# Patient Record
Sex: Female | Born: 1960
Health system: Southern US, Community
[De-identification: ages and names within clinical notes are randomized; demographics above are authoritative.]

## PROBLEM LIST (undated history)

## (undated) DIAGNOSIS — F329 Major depressive disorder, single episode, unspecified: Secondary | ICD-10-CM

## (undated) DIAGNOSIS — K219 Gastro-esophageal reflux disease without esophagitis: Secondary | ICD-10-CM

## (undated) DIAGNOSIS — R12 Heartburn: Secondary | ICD-10-CM

## (undated) DIAGNOSIS — J45909 Unspecified asthma, uncomplicated: Secondary | ICD-10-CM

## (undated) DIAGNOSIS — N200 Calculus of kidney: Secondary | ICD-10-CM

## (undated) DIAGNOSIS — K859 Acute pancreatitis without necrosis or infection, unspecified: Secondary | ICD-10-CM

## (undated) DIAGNOSIS — M1990 Unspecified osteoarthritis, unspecified site: Secondary | ICD-10-CM

## (undated) DIAGNOSIS — M5030 Other cervical disc degeneration, unspecified cervical region: Secondary | ICD-10-CM

## (undated) DIAGNOSIS — I10 Essential (primary) hypertension: Secondary | ICD-10-CM

## (undated) DIAGNOSIS — M4802 Spinal stenosis, cervical region: Secondary | ICD-10-CM

## (undated) DIAGNOSIS — E785 Hyperlipidemia, unspecified: Secondary | ICD-10-CM

## (undated) DIAGNOSIS — G4730 Sleep apnea, unspecified: Secondary | ICD-10-CM

## (undated) DIAGNOSIS — M5412 Radiculopathy, cervical region: Secondary | ICD-10-CM

## (undated) DIAGNOSIS — F32A Depression, unspecified: Secondary | ICD-10-CM

## (undated) DIAGNOSIS — G43909 Migraine, unspecified, not intractable, without status migrainosus: Secondary | ICD-10-CM

## (undated) DIAGNOSIS — M48061 Spinal stenosis, lumbar region without neurogenic claudication: Secondary | ICD-10-CM

## (undated) DIAGNOSIS — E119 Type 2 diabetes mellitus without complications: Secondary | ICD-10-CM

## (undated) DIAGNOSIS — Z87442 Personal history of urinary calculi: Secondary | ICD-10-CM

## (undated) HISTORY — DX: Spinal stenosis, cervical region: M48.02

## (undated) HISTORY — DX: Calculus of kidney: N20.0

## (undated) HISTORY — DX: Spinal stenosis, lumbar region without neurogenic claudication: M48.061

## (undated) HISTORY — DX: Type 2 diabetes mellitus without complications: E11.9

## (undated) HISTORY — DX: Personal history of urinary calculi: Z87.442

## (undated) HISTORY — DX: Unspecified osteoarthritis, unspecified site: M19.90

## (undated) HISTORY — DX: Hyperlipidemia, unspecified: E78.5

## (undated) HISTORY — DX: Sleep apnea, unspecified: G47.30

## (undated) HISTORY — PX: TUBAL LIGATION: SHX77

## (undated) HISTORY — DX: Heartburn: R12

## (undated) HISTORY — DX: Other cervical disc degeneration, unspecified cervical region: M50.30

## (undated) HISTORY — PX: COLONOSCOPY: SHX174

## (undated) HISTORY — DX: Migraine, unspecified, not intractable, without status migrainosus: G43.909

## (undated) HISTORY — DX: Unspecified asthma, uncomplicated: J45.909

---

## 1984-08-14 DIAGNOSIS — O24419 Gestational diabetes mellitus in pregnancy, unspecified control: Secondary | ICD-10-CM

## 1994-05-21 HISTORY — PX: CARPAL TUNNEL RELEASE: SHX101

## 1999-01-16 ENCOUNTER — Other Ambulatory Visit: Admission: RE | Admit: 1999-01-16 | Discharge: 1999-01-16 | Payer: Self-pay | Admitting: Obstetrics and Gynecology

## 1999-07-19 ENCOUNTER — Other Ambulatory Visit: Admission: RE | Admit: 1999-07-19 | Discharge: 1999-07-19 | Payer: Self-pay | Admitting: *Deleted

## 2001-04-15 ENCOUNTER — Other Ambulatory Visit: Admission: RE | Admit: 2001-04-15 | Discharge: 2001-04-15 | Payer: Self-pay | Admitting: Obstetrics and Gynecology

## 2002-10-13 ENCOUNTER — Other Ambulatory Visit: Admission: RE | Admit: 2002-10-13 | Discharge: 2002-10-13 | Payer: Self-pay | Admitting: Obstetrics and Gynecology

## 2004-03-14 ENCOUNTER — Ambulatory Visit: Payer: Self-pay | Admitting: Obstetrics and Gynecology

## 2004-03-30 ENCOUNTER — Ambulatory Visit: Payer: Self-pay | Admitting: Obstetrics and Gynecology

## 2005-06-19 ENCOUNTER — Ambulatory Visit: Payer: Self-pay | Admitting: Obstetrics and Gynecology

## 2006-12-20 ENCOUNTER — Ambulatory Visit: Payer: Self-pay

## 2007-02-19 ENCOUNTER — Ambulatory Visit: Payer: Self-pay | Admitting: Internal Medicine

## 2007-03-26 ENCOUNTER — Ambulatory Visit: Payer: Self-pay | Admitting: Internal Medicine

## 2007-04-14 ENCOUNTER — Ambulatory Visit: Payer: Self-pay | Admitting: Gastroenterology

## 2007-05-22 HISTORY — PX: CERVICAL FUSION: SHX112

## 2007-07-17 ENCOUNTER — Ambulatory Visit: Payer: Self-pay | Admitting: Emergency Medicine

## 2007-08-19 ENCOUNTER — Ambulatory Visit: Payer: Self-pay | Admitting: General Practice

## 2007-09-09 ENCOUNTER — Inpatient Hospital Stay (HOSPITAL_COMMUNITY): Admission: RE | Admit: 2007-09-09 | Discharge: 2007-09-11 | Payer: Self-pay | Admitting: Neurosurgery

## 2007-10-22 ENCOUNTER — Encounter: Payer: Self-pay | Admitting: Neurosurgery

## 2007-11-19 ENCOUNTER — Encounter: Payer: Self-pay | Admitting: Neurosurgery

## 2007-12-20 ENCOUNTER — Encounter: Payer: Self-pay | Admitting: Neurosurgery

## 2008-03-04 ENCOUNTER — Ambulatory Visit: Payer: Self-pay

## 2008-06-04 ENCOUNTER — Emergency Department: Payer: Self-pay | Admitting: Emergency Medicine

## 2008-07-14 ENCOUNTER — Encounter: Payer: Self-pay | Admitting: General Practice

## 2008-07-19 ENCOUNTER — Encounter: Payer: Self-pay | Admitting: General Practice

## 2008-08-19 ENCOUNTER — Encounter: Payer: Self-pay | Admitting: General Practice

## 2008-09-18 ENCOUNTER — Encounter: Payer: Self-pay | Admitting: General Practice

## 2009-01-01 ENCOUNTER — Emergency Department: Payer: Self-pay | Admitting: Internal Medicine

## 2009-01-05 ENCOUNTER — Emergency Department: Payer: Self-pay | Admitting: Unknown Physician Specialty

## 2009-01-27 ENCOUNTER — Ambulatory Visit: Payer: Self-pay | Admitting: Surgery

## 2009-01-28 ENCOUNTER — Ambulatory Visit: Payer: Self-pay | Admitting: Surgery

## 2009-03-08 ENCOUNTER — Ambulatory Visit: Payer: Self-pay

## 2009-04-04 ENCOUNTER — Inpatient Hospital Stay: Payer: Self-pay | Admitting: Internal Medicine

## 2010-02-10 ENCOUNTER — Ambulatory Visit: Payer: Self-pay | Admitting: Internal Medicine

## 2010-03-30 ENCOUNTER — Ambulatory Visit: Payer: Self-pay | Admitting: Unknown Physician Specialty

## 2010-09-01 ENCOUNTER — Ambulatory Visit: Payer: Self-pay | Admitting: Internal Medicine

## 2010-10-03 NOTE — Op Note (Signed)
[name], [name]              ACCOUNT NO.:  [id number]   MEDICAL RECORD NO.:  [id number]          PATIENT TYPE:  INP   LOCATION:  2899                         FACILITY:  [facility]   PHYSICIAN:  Hilda Lias, M.D.   DATE OF BIRTH:  1960-12-31   DATE OF PROCEDURE:  09/09/2007  DATE OF DISCHARGE:                               OPERATIVE REPORT   PREOPERATIVE DIAGNOSIS:  Cervical myelopathy with cervical stenosis C4-  C5, C5-C6, and C6-C7.   POSTOPERATIVE DIAGNOSIS:  Cervical myelopathy with cervical stenosis C4-  C5, C5-C6, and C6-C7.   PROCEDURES:  Anterior C4-C5, C5-C6, and C6-C7 diskectomy, decompression  of the spinal cord, foraminotomy, interbody fusion with auto and  allograft plate, microscope.   SURGEON:  Hilda Lias, MD   ASSISTANT:  Dr. Lovell Sheehan.   CLINICAL HISTORY:  The patient was seen in my office because of his neck  pain with the weakness.  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  X-ray  shows severe stenosis from C4 down to C6-C7.  So, it was advised, the  risks was explained in the history and physical.   DESCRIPTION OF PROCEDURE:  The patient was taken to the operating room  and after intubation, the neck was cleaned with DuraPrep.  We attempted  to do a transverse incision but she had almost no neck.  We had to put  some pillow underneath the shoulder and also the neck just to bring  in  some space.  We had to pull the shoulder down with tape.  Then a  longitudinal incision was made through the skin and subcutaneous tissue,  platysma down to cervical spine.  It took [place] at least 5 x-rays before we  were able to see the tip of the x-ray, and the needle at the level of  C3-C4 and C4-C5.  __________  we opened the anterior ligament of C4-C5,  C5-C6, and C6-C7.  We brought the microscope into the area because we  knew that C6-C7 was the worst, we opened the anterior ligament using the  __________  as well as the 1 and 2 millimeter Kerrison punch.  We were  able to do a total diskectomy, there was a large calcified disk, which  was in the midline going to the left side after we removed the posterior  ligament, decompression of the spinal cord was achieved.  The same  finding was also a little bit less pronounced but nevertheless quite  significant at the level of C5-C6 and also at the level of C4-C5.  At  the end we had plenty of roof at the space at the level C4-C5, C5-C6,  and C6-C7.  Three allograft of 7 mm, lordotic, with autograft the needle  were inserted followed by a plate using 8 screws, lateral cervical spine  showed that there was a good position of the bone graft and the plate at  the level of C4-C5, we were unable to see below.  From there on to be  accomplished the hemostasis, we will have drain the  area and the wound  was closed with Vicryl and Steri-Strips.           ______________________________  Hilda Lias, M.D.     EB/MEDQ  D:  09/09/2007  T:  09/10/2007  Job:  [id number]

## 2011-02-05 ENCOUNTER — Ambulatory Visit: Payer: Self-pay | Admitting: Internal Medicine

## 2011-02-13 LAB — CBC
HCT: 38.6
MCHC: 34.1
MCV: 92.3
RBC: 4.18
WBC: 8.1

## 2011-02-13 LAB — BASIC METABOLIC PANEL
BUN: 10
CO2: 27
Chloride: 102
GFR calc Af Amer: >60
Potassium: 4.7

## 2011-03-07 ENCOUNTER — Ambulatory Visit: Payer: Self-pay | Admitting: Internal Medicine

## 2011-03-22 ENCOUNTER — Ambulatory Visit: Payer: Self-pay | Admitting: Internal Medicine

## 2011-04-21 ENCOUNTER — Ambulatory Visit: Payer: Self-pay | Admitting: Internal Medicine

## 2011-05-24 ENCOUNTER — Ambulatory Visit: Payer: Self-pay | Admitting: Gastroenterology

## 2011-06-09 ENCOUNTER — Emergency Department: Payer: Self-pay | Admitting: Emergency Medicine

## 2011-06-19 ENCOUNTER — Ambulatory Visit: Payer: Self-pay | Admitting: Internal Medicine

## 2011-12-03 ENCOUNTER — Emergency Department: Payer: Self-pay | Admitting: Emergency Medicine

## 2012-03-21 ENCOUNTER — Ambulatory Visit: Payer: Self-pay

## 2012-04-08 ENCOUNTER — Other Ambulatory Visit: Payer: Self-pay | Admitting: Neurosurgery

## 2012-04-08 DIAGNOSIS — M5410 Radiculopathy, site unspecified: Secondary | ICD-10-CM

## 2012-04-08 DIAGNOSIS — M549 Dorsalgia, unspecified: Secondary | ICD-10-CM

## 2012-04-08 DIAGNOSIS — M542 Cervicalgia: Secondary | ICD-10-CM

## 2012-04-24 ENCOUNTER — Other Ambulatory Visit: Payer: Self-pay

## 2012-05-01 ENCOUNTER — Ambulatory Visit
Admission: RE | Admit: 2012-05-01 | Discharge: 2012-05-01 | Disposition: A | Discharge status: Home / Self Care | Payer: PRIVATE HEALTH INSURANCE | Source: Ambulatory Visit | Attending: Neurosurgery | Admitting: Neurosurgery

## 2012-05-01 VITALS — BP 151/63 | HR 64 | Ht 63.0 in | Wt 208.0 lb

## 2012-05-01 DIAGNOSIS — M542 Cervicalgia: Secondary | ICD-10-CM

## 2012-05-01 DIAGNOSIS — M549 Dorsalgia, unspecified: Secondary | ICD-10-CM

## 2012-05-01 DIAGNOSIS — M5410 Radiculopathy, site unspecified: Secondary | ICD-10-CM

## 2012-05-01 MED ORDER — ONDANSETRON HCL 4 MG/2ML IJ SOLN
4.0000 mg | Freq: Once | INTRAMUSCULAR | Status: AC
  Administered 2012-05-01: 4 mg via INTRAMUSCULAR

## 2012-05-01 MED ORDER — IOHEXOL 300 MG/ML  SOLN
10.0000 mL | Freq: Once | INTRAMUSCULAR | Status: AC | PRN
  Administered 2012-05-01: 10 mL via INTRATHECAL

## 2012-05-01 MED ORDER — MEPERIDINE HCL 100 MG/ML IJ SOLN
50.0000 mg | Freq: Once | INTRAMUSCULAR | Status: AC
  Administered 2012-05-01: 50 mg via INTRAMUSCULAR

## 2012-05-01 MED ORDER — DIAZEPAM 5 MG PO TABS
10.0000 mg | ORAL_TABLET | Freq: Once | ORAL | Status: AC
  Administered 2012-05-01: 5 mg via ORAL

## 2012-06-19 ENCOUNTER — Ambulatory Visit: Payer: Self-pay | Admitting: Internal Medicine

## 2013-03-26 HISTORY — PX: COLPOSCOPY: SHX161

## 2013-08-12 HISTORY — PX: CERVICAL BIOPSY  W/ LOOP ELECTRODE EXCISION: SUR135

## 2013-11-26 DIAGNOSIS — M47816 Spondylosis without myelopathy or radiculopathy, lumbar region: Secondary | ICD-10-CM | POA: Insufficient documentation

## 2013-11-26 DIAGNOSIS — M48061 Spinal stenosis, lumbar region without neurogenic claudication: Secondary | ICD-10-CM | POA: Insufficient documentation

## 2013-11-26 DIAGNOSIS — M5116 Intervertebral disc disorders with radiculopathy, lumbar region: Secondary | ICD-10-CM | POA: Insufficient documentation

## 2015-02-11 DIAGNOSIS — E1165 Type 2 diabetes mellitus with hyperglycemia: Secondary | ICD-10-CM | POA: Insufficient documentation

## 2015-02-11 DIAGNOSIS — E119 Type 2 diabetes mellitus without complications: Secondary | ICD-10-CM | POA: Insufficient documentation

## 2015-03-21 ENCOUNTER — Other Ambulatory Visit: Payer: Self-pay | Admitting: Physical Medicine and Rehabilitation

## 2015-03-21 DIAGNOSIS — M542 Cervicalgia: Secondary | ICD-10-CM

## 2015-03-21 DIAGNOSIS — M25511 Pain in right shoulder: Secondary | ICD-10-CM

## 2015-03-21 DIAGNOSIS — M25512 Pain in left shoulder: Secondary | ICD-10-CM

## 2015-03-25 ENCOUNTER — Ambulatory Visit: Payer: Self-pay

## 2015-04-04 ENCOUNTER — Other Ambulatory Visit: Payer: Self-pay | Admitting: Physical Medicine and Rehabilitation

## 2015-04-04 DIAGNOSIS — M5412 Radiculopathy, cervical region: Secondary | ICD-10-CM

## 2015-04-05 ENCOUNTER — Other Ambulatory Visit: Payer: Self-pay | Admitting: Physical Medicine and Rehabilitation

## 2015-04-05 DIAGNOSIS — M5412 Radiculopathy, cervical region: Secondary | ICD-10-CM

## 2015-04-06 ENCOUNTER — Ambulatory Visit: Payer: 59

## 2015-04-26 ENCOUNTER — Ambulatory Visit
Admission: RE | Admit: 2015-04-26 | Discharge: 2015-04-26 | Disposition: A | Discharge status: Home / Self Care | Payer: 59 | Source: Ambulatory Visit | Attending: Physical Medicine and Rehabilitation | Admitting: Physical Medicine and Rehabilitation

## 2015-04-26 DIAGNOSIS — M545 Low back pain: Secondary | ICD-10-CM | POA: Diagnosis present

## 2015-04-26 DIAGNOSIS — M542 Cervicalgia: Secondary | ICD-10-CM | POA: Insufficient documentation

## 2015-04-26 DIAGNOSIS — M25511 Pain in right shoulder: Secondary | ICD-10-CM | POA: Diagnosis not present

## 2015-04-26 DIAGNOSIS — M5412 Radiculopathy, cervical region: Secondary | ICD-10-CM

## 2015-04-26 DIAGNOSIS — M4802 Spinal stenosis, cervical region: Secondary | ICD-10-CM | POA: Diagnosis not present

## 2015-04-26 DIAGNOSIS — R2689 Other abnormalities of gait and mobility: Secondary | ICD-10-CM | POA: Diagnosis present

## 2015-04-26 DIAGNOSIS — M25512 Pain in left shoulder: Secondary | ICD-10-CM | POA: Diagnosis not present

## 2015-04-26 DIAGNOSIS — M4806 Spinal stenosis, lumbar region: Secondary | ICD-10-CM | POA: Diagnosis not present

## 2015-04-26 DIAGNOSIS — Z981 Arthrodesis status: Secondary | ICD-10-CM | POA: Insufficient documentation

## 2015-04-28 DIAGNOSIS — M5412 Radiculopathy, cervical region: Secondary | ICD-10-CM | POA: Insufficient documentation

## 2015-04-28 DIAGNOSIS — M5030 Other cervical disc degeneration, unspecified cervical region: Secondary | ICD-10-CM

## 2015-04-28 HISTORY — DX: Other cervical disc degeneration, unspecified cervical region: M50.30

## 2015-05-24 DIAGNOSIS — Z794 Long term (current) use of insulin: Secondary | ICD-10-CM | POA: Diagnosis not present

## 2015-05-24 DIAGNOSIS — E119 Type 2 diabetes mellitus without complications: Secondary | ICD-10-CM | POA: Diagnosis not present

## 2015-05-24 DIAGNOSIS — E1129 Type 2 diabetes mellitus with other diabetic kidney complication: Secondary | ICD-10-CM | POA: Diagnosis not present

## 2015-05-24 DIAGNOSIS — G4733 Obstructive sleep apnea (adult) (pediatric): Secondary | ICD-10-CM | POA: Diagnosis not present

## 2015-05-24 DIAGNOSIS — R809 Proteinuria, unspecified: Secondary | ICD-10-CM | POA: Diagnosis not present

## 2015-05-24 MED FILL — traMADol HCL 50 MG TABS: 50 | 30 days supply | Qty: 60 | Fill #3

## 2015-05-24 MED FILL — TRESIBA FLEXTOUCH 200 UNITS: 200 | 75 days supply | Qty: 9 | Fill #0

## 2015-05-26 MED FILL — UNIFINE PENTIPS 31GX3/16: 31G X 5 MM | 90 days supply | Qty: 100 | Fill #1

## 2015-05-26 MED FILL — TRULICITY 1.5 MG/0.5 ML PEN: 1.5 | 28 days supply | Qty: 2 | Fill #1

## 2015-05-27 MED FILL — GLIMEPIRIDE 4 MG TABLET: 4 | 90 days supply | Qty: 180 | Fill #0

## 2015-05-27 MED FILL — PIOGLITAZONE HCL 30 MG TAB: 30 | 90 days supply | Qty: 90 | Fill #0

## 2015-05-27 MED FILL — LOSARTAN POTASSIUM 25 MG TA: 25 | 90 days supply | Qty: 90 | Fill #0

## 2015-05-27 MED FILL — METOPROLOL SUCC ER 25 MG TA: 25 | 90 days supply | Qty: 90 | Fill #0

## 2015-05-27 MED FILL — METFORMIN HCL ER 500 MG TAB: 500 | 90 days supply | Qty: 90 | Fill #0

## 2015-05-27 MED FILL — ATORVASTATIN 20 MG TABLET: 20 | 90 days supply | Qty: 90 | Fill #0

## 2015-05-27 MED FILL — DULoxetine HCL 60 MG CPEP: 60 | 90 days supply | Qty: 90 | Fill #0

## 2015-05-31 MED FILL — OMEPRAZOLE DR 40 MG CAPSULE: 40 | 90 days supply | Qty: 180 | Fill #0

## 2015-05-31 MED FILL — GABAPENTIN 300 MG CAPSULE: 300 | 90 days supply | Qty: 270 | Fill #0

## 2015-06-01 DIAGNOSIS — E1129 Type 2 diabetes mellitus with other diabetic kidney complication: Secondary | ICD-10-CM | POA: Diagnosis not present

## 2015-06-01 DIAGNOSIS — Z23 Encounter for immunization: Secondary | ICD-10-CM | POA: Diagnosis not present

## 2015-06-01 DIAGNOSIS — G4733 Obstructive sleep apnea (adult) (pediatric): Secondary | ICD-10-CM | POA: Diagnosis not present

## 2015-06-01 DIAGNOSIS — J4520 Mild intermittent asthma, uncomplicated: Secondary | ICD-10-CM | POA: Diagnosis not present

## 2015-06-01 DIAGNOSIS — D472 Monoclonal gammopathy: Secondary | ICD-10-CM | POA: Insufficient documentation

## 2015-06-01 DIAGNOSIS — M5030 Other cervical disc degeneration, unspecified cervical region: Secondary | ICD-10-CM | POA: Diagnosis not present

## 2015-06-01 DIAGNOSIS — R809 Proteinuria, unspecified: Secondary | ICD-10-CM | POA: Diagnosis not present

## 2015-06-01 DIAGNOSIS — E7800 Pure hypercholesterolemia, unspecified: Secondary | ICD-10-CM | POA: Insufficient documentation

## 2015-06-10 DIAGNOSIS — M5136 Other intervertebral disc degeneration, lumbar region: Secondary | ICD-10-CM | POA: Diagnosis not present

## 2015-06-10 DIAGNOSIS — M4806 Spinal stenosis, lumbar region: Secondary | ICD-10-CM | POA: Diagnosis not present

## 2015-06-10 DIAGNOSIS — M5416 Radiculopathy, lumbar region: Secondary | ICD-10-CM | POA: Diagnosis not present

## 2015-06-21 DIAGNOSIS — R809 Proteinuria, unspecified: Secondary | ICD-10-CM | POA: Diagnosis not present

## 2015-06-21 DIAGNOSIS — R3129 Other microscopic hematuria: Secondary | ICD-10-CM | POA: Diagnosis not present

## 2015-06-21 DIAGNOSIS — N390 Urinary tract infection, site not specified: Secondary | ICD-10-CM | POA: Diagnosis not present

## 2015-06-21 DIAGNOSIS — D472 Monoclonal gammopathy: Secondary | ICD-10-CM | POA: Diagnosis not present

## 2015-06-21 MED FILL — levoFLOXacin 500 MG TABS: 500 | 7 days supply | Qty: 7 | Fill #0

## 2015-06-22 MED FILL — HYDROCODON-APAP 5-325: 5-325 | 30 days supply | Qty: 60 | Fill #0

## 2015-06-23 MED FILL — traMADol HCL 50 MG TABS: 50 | 30 days supply | Qty: 60 | Fill #0

## 2015-06-29 DIAGNOSIS — R3129 Other microscopic hematuria: Secondary | ICD-10-CM | POA: Diagnosis not present

## 2015-07-01 MED FILL — TRULICITY 1.5 MG/0.5 ML PEN: 1.5 | 28 days supply | Qty: 2 | Fill #0

## 2015-07-07 DIAGNOSIS — N3080 Other cystitis without hematuria: Secondary | ICD-10-CM | POA: Diagnosis not present

## 2015-07-07 DIAGNOSIS — B965 Pseudomonas (aeruginosa) (mallei) (pseudomallei) as the cause of diseases classified elsewhere: Secondary | ICD-10-CM | POA: Diagnosis not present

## 2015-07-07 DIAGNOSIS — R809 Proteinuria, unspecified: Secondary | ICD-10-CM | POA: Diagnosis not present

## 2015-07-07 MED FILL — CIPROFLOXACIN HCL 500 MG TA: 500 | 14 days supply | Qty: 28 | Fill #0

## 2015-07-08 DIAGNOSIS — N200 Calculus of kidney: Secondary | ICD-10-CM | POA: Diagnosis not present

## 2015-07-08 DIAGNOSIS — B965 Pseudomonas (aeruginosa) (mallei) (pseudomallei) as the cause of diseases classified elsewhere: Secondary | ICD-10-CM | POA: Diagnosis not present

## 2015-07-08 DIAGNOSIS — N390 Urinary tract infection, site not specified: Secondary | ICD-10-CM | POA: Diagnosis not present

## 2015-07-14 ENCOUNTER — Ambulatory Visit: Payer: Self-pay | Admitting: Urology

## 2015-07-15 ENCOUNTER — Encounter: Payer: Self-pay | Admitting: Urology

## 2015-07-15 ENCOUNTER — Ambulatory Visit (INDEPENDENT_AMBULATORY_CARE_PROVIDER_SITE_OTHER): Payer: 59 | Admitting: Urology

## 2015-07-15 VITALS — BP 119/76 | HR 94 | Ht 64.0 in | Wt 231.6 lb

## 2015-07-15 DIAGNOSIS — N200 Calculus of kidney: Secondary | ICD-10-CM

## 2015-07-15 DIAGNOSIS — R3129 Other microscopic hematuria: Secondary | ICD-10-CM

## 2015-07-15 DIAGNOSIS — N390 Urinary tract infection, site not specified: Secondary | ICD-10-CM

## 2015-07-15 LAB — URINALYSIS, COMPLETE
Bilirubin, UA: NEGATIVE
Glucose, UA: NEGATIVE
KETONES UA: NEGATIVE
Nitrite, UA: NEGATIVE
PH UA: 5.5 (ref 5.0–7.5)
SPEC GRAV UA: 1.025 (ref 1.005–1.030)
Urobilinogen, Ur: 0.2 mg/dL (ref 0.2–1.0)

## 2015-07-15 LAB — MICROSCOPIC EXAMINATION
BACTERIA UA: NONE SEEN
WBC, UA: >30 /hpf — AB (ref 0–?)

## 2015-07-15 NOTE — Progress Notes (Signed)
07/15/2015 10:37 AM   [name] 02/21/1961 [id number]  Referring provider: Adin Hector, MD [facility] [place], [place] [address]  Chief Complaint  Patient presents with  . Nephrolithiasis    referred by  Adrian Prows    HPI: Patient is a 55 year old  Caucasian female who presents today at the request of infectious disease service, Dr. Ola Spurr, for recurrent urinary tract infections and a large renal nephrolithiasis.  Patient's states that she will get a urinary tract infection be placed on antibiotics once antibiotics were completed she would have another urinary tract infection within a few days.  She states her most recent UTI was positive for Pseudomonas. She was given one week of Levaquin and it "kinda of cleared" up and then she was placed on Cipro.  She is currently on that antibiotic.  I do not have any documented urine cultures available to me at this time.   Her urinary tract infection symptoms consist of cloudy urine, frequency and lower back pain.  She denies gross hematuria, dysuria and suprapubic pain. She does have persistent microscopic hematuria and has greater than 30 RBC's per prior field on today's urinalysis.  She has not had fevers, chills, nausea or vomiting.  She states that she had a renal ultrasound performed at her nephrologist's office on 06/29/2015. A 2.6 cm stone was discovered in her right kidney.  She does have a prior history of nephrolithiasis and has passed a stone spontaneously.  Stone composition is unknown at this time.  I do not have access to the RUS images.   PMH: Past Medical History  Diagnosis Date  . Heartburn   . Arthritis   . Diabetes ([place])   . HLD (hyperlipidemia)   . Sleep apnea   . Cervical spinal stenosis   . Lumbar stenosis   . History of kidney stones     Surgical History: Past Surgical History  Procedure Laterality Date  . Cervical fusion  2009  . Carpal tunnel release   1996  . Tubal ligation      Home Medications:    Medication List       This list is accurate as of: 07/15/15 10:37 AM.  Always use your most recent med list.               atorvastatin 20 MG tablet  Commonly known as:  LIPITOR  Take by mouth.     ciprofloxacin 500 MG tablet  Commonly known as:  CIPRO  Take by mouth.     DULoxetine 60 MG capsule  Commonly known as:  CYMBALTA  Take by mouth.     gabapentin 300 MG capsule  Commonly known as:  NEURONTIN  Take by mouth.     glimepiride 4 MG tablet  Commonly known as:  AMARYL  Take by mouth.     HYDROcodone-acetaminophen 5-325 MG tablet  Commonly known as:  NORCO/VICODIN     levofloxacin 500 MG tablet  Commonly known as:  LEVAQUIN  Reported on 07/15/2015     losartan 25 MG tablet  Commonly known as:  COZAAR  Take by mouth.     metFORMIN 500 MG 24 hr tablet  Commonly known as:  GLUCOPHAGE-XR  Take by mouth.     metoprolol succinate 25 MG 24 hr tablet  Commonly known as:  TOPROL-XL  Take by mouth.     nabumetone 750 MG tablet  Commonly known as:  RELAFEN  Take by  mouth.     omeprazole 40 MG capsule  Commonly known as:  PRILOSEC  Take by mouth.     ondansetron 4 MG tablet  Commonly known as:  ZOFRAN  Take by mouth.     pioglitazone 30 MG tablet  Commonly known as:  ACTOS  Take by mouth.     traMADol 50 MG tablet  Commonly known as:  ULTRAM  Take by mouth.     TRESIBA FLEXTOUCH 200 UNIT/ML Sopn  Generic drug:  Insulin Degludec  Inject 28 Units into the skin.     TRULICITY 1.5 0000000 Sopn  Generic drug:  Dulaglutide  Inject into the skin.     UNIFINE PENTIPS 31G X 5 MM Misc  Generic drug:  Insulin Pen Needle     zolpidem 5 MG tablet  Commonly known as:  AMBIEN  Take by mouth.        Allergies:  Allergies  Allergen Reactions  . Biaxin [Clarithromycin] Nausea And Vomiting and Nausea Only    Family History: Family History  Problem Relation Age of Onset  . Kidney disease Neg Hx     . Bladder Cancer Neg Hx   . Heart failure Mother   . Heart attack Father     Social History:  reports that she has never smoked. She has never used smokeless tobacco. She reports that she drinks alcohol. She reports that she does not use illicit drugs.  ROS: UROLOGY Frequent Urination?: Yes Hard to postpone urination?: Yes Burning/pain with urination?: No Get up at night to urinate?: No Leakage of urine?: No Urine stream starts and stops?: No Trouble starting stream?: No Do you have to strain to urinate?: No Blood in urine?: No Urinary tract infection?: Yes Sexually transmitted disease?: No Injury to kidneys or bladder?: No Painful intercourse?: No Weak stream?: No Currently pregnant?: No Vaginal bleeding?: No Last menstrual period?: n  Gastrointestinal Nausea?: Yes Vomiting?: No Indigestion/heartburn?: No Diarrhea?: No Constipation?: No  Constitutional Fever: No Night sweats?: No Weight loss?: No Fatigue?: No  Skin Skin rash/lesions?: No Itching?: No  Eyes Blurred vision?: No Double vision?: No  Ears/Nose/Throat Sore throat?: No Sinus problems?: No  Hematologic/Lymphatic Swollen glands?: No Easy bruising?: No  Cardiovascular Leg swelling?: No Chest pain?: No  Respiratory Cough?: No Shortness of breath?: No  Endocrine Excessive thirst?: No  Musculoskeletal Back pain?: Yes Joint pain?: No  Neurological Headaches?: No Dizziness?: No  Psychologic Depression?: No Anxiety?: No  Physical Exam: BP 119/76 mmHg  Pulse 94  Ht 5\' 4"  (1.626 m)  Wt 231 lb 9.6 oz (105.053 kg)  BMI 39.73 kg/m2  Constitutional: Well nourished. Alert and oriented, No acute distress. HEENT: [place] AT, moist mucus membranes. Trachea midline, no masses. Cardiovascular: No clubbing, cyanosis, or edema. Respiratory: Normal respiratory effort, no increased work of breathing. GI: Abdomen is soft, non tender, non distended, no abdominal masses. Liver and spleen not  palpable.  No hernias appreciated.  Stool sample for occult testing is not indicated.   GU: No CVA tenderness.  No bladder fullness or masses.   Skin: No rashes, bruises or suspicious lesions. Lymph: No cervical or inguinal adenopathy. Neurologic: Grossly intact, no focal deficits, moving all 4 extremities. Psychiatric: Normal mood and affect.  Laboratory Data: Lab Results  Component Value Date   WBC 8.1 09/05/2007   HGB 13.2 09/05/2007   HCT 38.6 09/05/2007   MCV 92.3 09/05/2007   PLT 410* 09/05/2007    Lab Results  Component Value Date   CREATININE  0.71 09/05/2007      Urinalysis Results for orders placed or performed in visit on 07/15/15  CULTURE, URINE COMPREHENSIVE  Result Value Ref Range   Urine Culture, Comprehensive Final report    Result 1 Comment   Microscopic Examination  Result Value Ref Range   WBC, UA >30 (A) 0 -  5 /hpf   RBC, UA >30 (A) 0 -  2 /hpf   Epithelial Cells (non renal) 0-10 0 - 10 /hpf   Bacteria, UA None seen None seen/Few  Urinalysis, Complete  Result Value Ref Range   Specific Gravity, UA 1.025 1.005 - 1.030   pH, UA 5.5 5.0 - 7.5   Color, UA Yellow Yellow   Appearance Ur Cloudy (A) Clear   Leukocytes, UA 2+ (A) Negative   Protein, UA 3+ (A) Negative/Trace   Glucose, UA Negative Negative   Ketones, UA Negative Negative   RBC, UA 3+ (A) Negative   Bilirubin, UA Negative Negative   Urobilinogen, Ur 0.2 0.2 - 1.0 mg/dL   Nitrite, UA Negative Negative   Microscopic Examination See below:   BUN+Creat  Result Value Ref Range   BUN 17 6 - 24 mg/dL   Creatinine, Ser 0.83 0.57 - 1.00 mg/dL   GFR calc non Af Amer 80 >59 mL/min/1.73   GFR calc Af Amer 92 >59 mL/min/1.73   BUN/Creatinine Ratio 20 9 - 23    Assessment & Plan:    1. Microscopic hematuria:   Explained to patient the causes of blood in the urine are as follows: stones,  UTI's, damage to the urinary tract and/or cancer.  It is explained to the patient that they will be  scheduled for a CT Urogram with contrast material and that in rare instances, an allergic reaction can be serious and even life threatening with the injection of contrast material.   The patient denies any allergies to contrast, iodine and/or seafood and is not taking metformin.  - Urinalysis, Complete - CULTURE, URINE COMPREHENSIVE - BUN+Creat - CT Abdomen Pelvis W Wo Contrast; Future  2. Kidney stone:   Patient with a reported 2.6 cm stone is her right kidney.  ID is concerned that this is a nidus for infection.  CT Urogram is pending.    3. Recurrent UTI's:   Per patient's report.  I do not have any documented urine cultures available to me at this time.  We will request the records once our fax server is running.  Her current UA is suspicious for infection.  I will send it for culture.     Return in about 1 week (around 07/22/2015) for CT Urogram report .  These notes generated with voice recognition software. I apologize for typographical errors.  Zara Council, [facility] [address] [place], [place] [address] [phone]

## 2015-07-16 LAB — BUN+CREAT
BUN/Creatinine Ratio: 20 (ref 9–23)
BUN: 17 mg/dL (ref 6–24)
CREATININE: 0.83 mg/dL (ref 0.57–1.00)
GFR, EST AFRICAN AMERICAN: 92 mL/min/{1.73_m2} (ref >59)
GFR, EST NON AFRICAN AMERICAN: 80 mL/min/{1.73_m2} (ref >59)

## 2015-07-17 LAB — CULTURE, URINE COMPREHENSIVE

## 2015-07-18 DIAGNOSIS — N200 Calculus of kidney: Secondary | ICD-10-CM | POA: Insufficient documentation

## 2015-07-18 DIAGNOSIS — R3129 Other microscopic hematuria: Secondary | ICD-10-CM | POA: Insufficient documentation

## 2015-07-18 DIAGNOSIS — N390 Urinary tract infection, site not specified: Secondary | ICD-10-CM | POA: Insufficient documentation

## 2015-07-18 HISTORY — DX: Calculus of kidney: N20.0

## 2015-07-25 ENCOUNTER — Telehealth: Payer: Self-pay

## 2015-07-25 ENCOUNTER — Ambulatory Visit
Admission: RE | Admit: 2015-07-25 | Discharge: 2015-07-25 | Disposition: A | Discharge status: Home / Self Care | Payer: 59 | Source: Ambulatory Visit | Attending: Urology | Admitting: Urology

## 2015-07-25 DIAGNOSIS — N200 Calculus of kidney: Secondary | ICD-10-CM | POA: Diagnosis not present

## 2015-07-25 MED ORDER — IOHEXOL 350 MG/ML SOLN
100.0000 mL | Freq: Once | INTRAVENOUS | Status: AC | PRN
  Administered 2015-07-25: 100 mL via INTRAVENOUS

## 2015-07-25 MED FILL — traMADol HCL 50 MG TABS: 50 | 30 days supply | Qty: 60 | Fill #1

## 2015-07-25 NOTE — Telephone Encounter (Signed)
Patient has a large stone see on CT scan.  This was suspected on previous RUS.  She needs to come and speak with one of the physicians for surgical planning.  Thanks

## 2015-07-25 NOTE — Telephone Encounter (Signed)
Pt CT results are back.

## 2015-07-26 NOTE — Telephone Encounter (Signed)
LMOM

## 2015-07-27 ENCOUNTER — Other Ambulatory Visit: Payer: Self-pay | Admitting: Nephrology

## 2015-07-27 NOTE — Telephone Encounter (Signed)
Spoke with pt in reference to CT results. Pt elected to cancel appt with Larene Beach and reschedule with a MD. Pt was transferred to the front to make new appt.

## 2015-07-28 MED FILL — TRULICITY 1.5 MG/0.5 ML PEN: 1.5 | 28 days supply | Qty: 2 | Fill #1

## 2015-08-01 ENCOUNTER — Ambulatory Visit: Payer: 59 | Admitting: Urology

## 2015-08-02 ENCOUNTER — Ambulatory Visit (INDEPENDENT_AMBULATORY_CARE_PROVIDER_SITE_OTHER): Payer: 59 | Admitting: Urology

## 2015-08-02 ENCOUNTER — Encounter: Payer: Self-pay | Admitting: Urology

## 2015-08-02 VITALS — BP 129/81 | HR 81 | Ht 63.5 in | Wt 230.8 lb

## 2015-08-02 DIAGNOSIS — N200 Calculus of kidney: Secondary | ICD-10-CM

## 2015-08-02 DIAGNOSIS — F32A Depression, unspecified: Secondary | ICD-10-CM | POA: Insufficient documentation

## 2015-08-02 DIAGNOSIS — G4730 Sleep apnea, unspecified: Secondary | ICD-10-CM | POA: Insufficient documentation

## 2015-08-02 DIAGNOSIS — M5136 Other intervertebral disc degeneration, lumbar region: Secondary | ICD-10-CM | POA: Insufficient documentation

## 2015-08-02 DIAGNOSIS — F329 Major depressive disorder, single episode, unspecified: Secondary | ICD-10-CM | POA: Insufficient documentation

## 2015-08-02 DIAGNOSIS — G43909 Migraine, unspecified, not intractable, without status migrainosus: Secondary | ICD-10-CM | POA: Insufficient documentation

## 2015-08-02 DIAGNOSIS — J45909 Unspecified asthma, uncomplicated: Secondary | ICD-10-CM

## 2015-08-02 DIAGNOSIS — M722 Plantar fascial fibromatosis: Secondary | ICD-10-CM | POA: Insufficient documentation

## 2015-08-02 HISTORY — DX: Unspecified asthma, uncomplicated: J45.909

## 2015-08-02 HISTORY — DX: Migraine, unspecified, not intractable, without status migrainosus: G43.909

## 2015-08-02 LAB — URINALYSIS, COMPLETE
Bilirubin, UA: NEGATIVE
Glucose, UA: NEGATIVE
Ketones, UA: NEGATIVE
Nitrite, UA: NEGATIVE
PH UA: 5.5 (ref 5.0–7.5)
Specific Gravity, UA: >1.03 — ABNORMAL HIGH (ref 1.005–1.030)
Urobilinogen, Ur: 0.2 mg/dL (ref 0.2–1.0)

## 2015-08-02 LAB — MICROSCOPIC EXAMINATION
BACTERIA UA: NONE SEEN
RBC, UA: >30 /hpf — AB (ref 0–?)

## 2015-08-02 NOTE — Progress Notes (Signed)
08/02/2015 9:25 AM   [name] 12, 1962 [id number]  Referring provider: Adin Hector, MD [facility] [place], [place] [address]  Chief Complaint  Patient presents with  . Follow-up    diagnose     HPI: [name] is a 55yo seen in followup for recurrent UTUI and hematuria. She underwent CT scan which showed a 45mm x 22mm right renal pelvis stone. She denies any pain   PMH: Past Medical History  Diagnosis Date  . Heartburn   . Arthritis   . Diabetes ([place])   . HLD (hyperlipidemia)   . Sleep apnea   . Cervical spinal stenosis   . Lumbar stenosis   . History of kidney stones   . Headache, migraine 08/02/2015  . Asthma without status asthmaticus 08/02/2015    Overview:  mild, not requiring treatment   . Degeneration of intervertebral disc of cervical region 04/28/2015  . Kidney stones 07/18/2015    Surgical History: Past Surgical History  Procedure Laterality Date  . Cervical fusion  2009  . Carpal tunnel release  1996  . Tubal ligation      Home Medications:    Medication List       This list is accurate as of: 08/02/15  9:25 AM.  Always use your most recent med list.               atorvastatin 20 MG tablet  Commonly known as:  LIPITOR  Take by mouth.     DULoxetine 60 MG capsule  Commonly known as:  CYMBALTA  Take by mouth.     gabapentin 300 MG capsule  Commonly known as:  NEURONTIN  Take by mouth.     glimepiride 4 MG tablet  Commonly known as:  AMARYL  Take by mouth.     HYDROcodone-acetaminophen 5-325 MG tablet  Commonly known as:  NORCO/VICODIN     levofloxacin 500 MG tablet  Commonly known as:  LEVAQUIN  Reported on 08/02/2015     losartan 25 MG tablet  Commonly known as:  COZAAR  Take by mouth.     metFORMIN 500 MG 24 hr tablet  Commonly known as:  GLUCOPHAGE-XR  Take by mouth.     metoprolol succinate 25 MG 24 hr tablet  Commonly known as:  TOPROL-XL  Take by mouth.     nabumetone 750 MG  tablet  Commonly known as:  RELAFEN  Take by mouth.     omeprazole 40 MG capsule  Commonly known as:  PRILOSEC  Take by mouth.     ondansetron 4 MG tablet  Commonly known as:  ZOFRAN  Take by mouth.     pioglitazone 30 MG tablet  Commonly known as:  ACTOS  Take by mouth.     traMADol 50 MG tablet  Commonly known as:  ULTRAM  Take by mouth.     TRESIBA FLEXTOUCH 200 UNIT/ML Sopn  Generic drug:  Insulin Degludec  Inject 28 Units into the skin.     TRULICITY 1.5 0000000 Sopn  Generic drug:  Dulaglutide  Inject into the skin.     UNIFINE PENTIPS 31G X 5 MM Misc  Generic drug:  Insulin Pen Needle     zolpidem 5 MG tablet  Commonly known as:  AMBIEN  Take by mouth.        Allergies:  Allergies  Allergen Reactions  . Biaxin [Clarithromycin] Nausea And Vomiting and Nausea Only    Family History: Family History  Problem Relation Age of Onset  . Kidney disease Neg Hx   . Bladder Cancer Neg Hx   . Heart failure Mother   . Heart attack Father     Social History:  reports that she has never smoked. She has never used smokeless tobacco. She reports that she drinks alcohol. She reports that she does not use illicit drugs.  ROS: UROLOGY Frequent Urination?: No Hard to postpone urination?: No Burning/pain with urination?: No Get up at night to urinate?: No Leakage of urine?: No Urine stream starts and stops?: No Trouble starting stream?: No Do you have to strain to urinate?: No Blood in urine?: No Urinary tract infection?: No Sexually transmitted disease?: No Injury to kidneys or bladder?: No Painful intercourse?: No Weak stream?: No Currently pregnant?: No Vaginal bleeding?: No Last menstrual period?: No  Gastrointestinal Nausea?: No Vomiting?: No Indigestion/heartburn?: No Diarrhea?: No Constipation?: No  Constitutional Fever: No Night sweats?: No Weight loss?: No Fatigue?: No  Skin Skin rash/lesions?: No Itching?: No  Eyes Blurred  vision?: No Double vision?: No  Ears/Nose/Throat Sore throat?: No Sinus problems?: No  Hematologic/Lymphatic Swollen glands?: No Easy bruising?: No  Cardiovascular Leg swelling?: No Chest pain?: No  Respiratory Cough?: No Shortness of breath?: No  Endocrine Excessive thirst?: No  Musculoskeletal Back pain?: No Joint pain?: No  Neurological Headaches?: No Dizziness?: No  Psychologic Depression?: No Anxiety?: No  Physical Exam: BP 129/81 mmHg  Pulse 81  Ht 5' 3.5" (1.613 m)  Wt 104.69 kg (230 lb 12.8 oz)  BMI 40.24 kg/m2  Constitutional:  Alert and oriented, No acute distress. HEENT: [place] AT, moist mucus membranes.  Trachea midline, no masses. Cardiovascular: No clubbing, cyanosis, or edema. RRR Respiratory: Normal respiratory effort, no increased work of breathing. CTAB GI: Abdomen is soft, nontender, nondistended, no abdominal masses GU: No CVA tenderness.  Skin: No rashes, bruises or suspicious lesions. Lymph: No cervical or inguinal adenopathy. Neurologic: Grossly intact, no focal deficits, moving all 4 extremities. Psychiatric: Normal mood and affect.  Laboratory Data: Lab Results  Component Value Date   WBC 8.1 09/05/2007   HGB 13.2 09/05/2007   HCT 38.6 09/05/2007   MCV 92.3 09/05/2007   PLT 410* 09/05/2007    Lab Results  Component Value Date   CREATININE 0.83 07/15/2015    No results found for: PSA  No results found for: TESTOSTERONE  No results found for: HGBA1C  Urinalysis    Component Value Date/Time   GLUCOSEU Negative 07/15/2015 0931   BILIRUBINUR Negative 07/15/2015 0931   NITRITE Negative 07/15/2015 0931   LEUKOCYTESUR 2+* 07/15/2015 0931    Pertinent Imaging: Ct scan w/wo contrast  Assessment & Plan:   1. Kidney stones -schedule for R PCNL - Urinalysis, Complete   No Follow-up on file.  Cleon Gustin, [facility] [address], [place], [place] [address] [phone]

## 2015-08-05 ENCOUNTER — Other Ambulatory Visit: Payer: Self-pay | Admitting: Urology

## 2015-08-17 NOTE — Patient Instructions (Signed)
Cartney Schirmer  08/17/2015   Your procedure is scheduled on: 08/24/2015    Report to [facility]  Entrance take [facility]  elevators to 3rd floor to  [place] at    1045 AM.  Call this number if you have problems the morning of surgery [phone]   Remember: ONLY 1 PERSON MAY GO WITH YOU TO SHORT STAY TO GET  READY MORNING OF [place].  Do not eat food or drink liquids :After Midnight.             Eat a good healthy snack prior to bedtime.   TAKE 1/2 of EVENING DOSE OF INSULIN NITE BEFORE SURGERY.     Take these medicines the morning of surgery with A SIP OF WATER: Hydrocodone, metopro9lol ( Toprol), Zofran if needed, Omeprazole ( Prilosec)  DO NOT TAKE ANY DIABETIC MEDICATIONS DAY OF YOUR SURGERY                               You may not have any metal on your body including hair pins and              piercings  Do not wear jewelry, make-up, lotions, powders or perfumes, deodorant             Do not wear nail polish.  Do not shave  48 hours prior to surgery.                Do not bring valuables to the hospital. [place].  Contacts, dentures or bridgework may not be worn into surgery.  Leave suitcase in the car. After surgery it may be brought to your room.         Special Instructions: coughing and deep breathing exercises, leg exercises               Please read over the following fact sheets you were given: _____________________________________________________________________             [facility] - Preparing for Surgery Before surgery, you can play an important role.  Because skin is not sterile, your skin needs to be as free of germs as possible.  You can reduce the number of germs on your skin by washing with CHG (chlorahexidine gluconate) soap before surgery.  CHG is an antiseptic cleaner which kills germs and bonds with the skin to continue killing germs even after washing. Please  DO NOT use if you have an allergy to CHG or antibacterial soaps.  If your skin becomes reddened/irritated stop using the CHG and inform your nurse when you arrive at Short Stay. Do not shave (including legs and underarms) for at least 48 hours prior to the first CHG shower.  You may shave your face/neck. Please follow these instructions carefully:  1.  Shower with CHG Soap the night before surgery and the  morning of Surgery.  2.  If you choose to wash your hair, wash your hair first as usual with your  normal  shampoo.  3.  After you shampoo, rinse your hair and body thoroughly to remove the  shampoo.  4.  Use CHG as you would any other liquid soap.  You can apply chg directly  to the skin and wash                       Gently with a scrungie or clean washcloth.  5.  Apply the CHG Soap to your body ONLY FROM THE NECK DOWN.   Do not use on face/ open                           Wound or open sores. Avoid contact with eyes, ears mouth and genitals (private parts).                       Wash face,  Genitals (private parts) with your normal soap.             6.  Wash thoroughly, paying special attention to the area where your surgery  will be performed.  7.  Thoroughly rinse your body with warm water from the neck down.  8.  DO NOT shower/wash with your normal soap after using and rinsing off  the CHG Soap.                9.  Pat yourself dry with a clean towel.            10.  Wear clean pajamas.            11.  Place clean sheets on your bed the night of your first shower and do not  sleep with pets. Day of Surgery : Do not apply any lotions/deodorants the morning of surgery.  Please wear clean clothes to the hospital/surgery center.  FAILURE TO FOLLOW THESE INSTRUCTIONS MAY RESULT IN THE CANCELLATION OF YOUR SURGERY PATIENT SIGNATURE_________________________________  NURSE  SIGNATURE__________________________________  ________________________________________________________________________

## 2015-08-18 ENCOUNTER — Encounter (HOSPITAL_COMMUNITY)
Admission: RE | Admit: 2015-08-18 | Discharge: 2015-08-18 | Disposition: A | Discharge status: Home / Self Care | Payer: 59 | Source: Ambulatory Visit | Attending: Urology | Admitting: Urology

## 2015-08-18 ENCOUNTER — Encounter (HOSPITAL_COMMUNITY): Payer: Self-pay

## 2015-08-18 DIAGNOSIS — Z01810 Encounter for preprocedural cardiovascular examination: Secondary | ICD-10-CM | POA: Insufficient documentation

## 2015-08-18 DIAGNOSIS — I10 Essential (primary) hypertension: Secondary | ICD-10-CM | POA: Insufficient documentation

## 2015-08-18 DIAGNOSIS — N200 Calculus of kidney: Secondary | ICD-10-CM | POA: Insufficient documentation

## 2015-08-18 DIAGNOSIS — E119 Type 2 diabetes mellitus without complications: Secondary | ICD-10-CM | POA: Insufficient documentation

## 2015-08-18 DIAGNOSIS — Z01812 Encounter for preprocedural laboratory examination: Secondary | ICD-10-CM | POA: Insufficient documentation

## 2015-08-18 HISTORY — DX: Gastro-esophageal reflux disease without esophagitis: K21.9

## 2015-08-18 LAB — BASIC METABOLIC PANEL
Anion gap: 9 (ref 5–15)
BUN: 25 mg/dL — AB (ref 6–20)
CALCIUM: 9.4 mg/dL (ref 8.9–10.3)
CO2: 25 mmol/L (ref 22–32)
CREATININE: 0.77 mg/dL (ref 0.44–1.00)
Chloride: 106 mmol/L (ref 101–111)
Glucose, Bld: 177 mg/dL — ABNORMAL HIGH (ref 65–99)
POTASSIUM: 4.3 mmol/L (ref 3.5–5.1)
SODIUM: 140 mmol/L (ref 135–145)

## 2015-08-18 LAB — CBC
HEMATOCRIT: 38.8 % (ref 36.0–46.0)
HEMOGLOBIN: 12.5 g/dL (ref 12.0–15.0)
MCH: 29.8 pg (ref 26.0–34.0)
MCHC: 32.2 g/dL (ref 30.0–36.0)
MCV: 92.4 fL (ref 78.0–100.0)
Platelets: 395 10*3/uL (ref 150–400)
RBC: 4.2 MIL/uL (ref 3.87–5.11)
RDW: 13.2 % (ref 11.5–15.5)
WBC: 8.9 10*3/uL (ref 4.0–10.5)

## 2015-08-18 NOTE — Progress Notes (Signed)
Final EKG done 08/18/2015- EPIC

## 2015-08-18 NOTE — Progress Notes (Addendum)
BMP results of 08/18/15 faxed via EPIC to Dr Alyson Ingles,.HGA1C done 08/18/15 faxed via EPIC to Dr Alyson Ingles.

## 2015-08-19 LAB — HEMOGLOBIN A1C
Hgb A1c MFr Bld: 8 % — ABNORMAL HIGH (ref 4.8–5.6)
Mean Plasma Glucose: 183 mg/dL

## 2015-08-24 ENCOUNTER — Ambulatory Visit (HOSPITAL_COMMUNITY): Payer: 59 | Admitting: Anesthesiology

## 2015-08-24 ENCOUNTER — Encounter (HOSPITAL_COMMUNITY): Payer: Self-pay | Admitting: *Deleted

## 2015-08-24 ENCOUNTER — Observation Stay (HOSPITAL_COMMUNITY)
Admission: RE | Admit: 2015-08-24 | Discharge: 2015-08-25 | Disposition: A | Discharge status: Home / Self Care | Payer: 59 | Source: Ambulatory Visit | Attending: Urology | Admitting: Urology

## 2015-08-24 ENCOUNTER — Ambulatory Visit (HOSPITAL_COMMUNITY): Payer: 59

## 2015-08-24 ENCOUNTER — Encounter (HOSPITAL_COMMUNITY)
Admission: RE | Disposition: A | Discharge status: Home / Self Care | Payer: Self-pay | Source: Ambulatory Visit | Attending: Urology

## 2015-08-24 ENCOUNTER — Observation Stay (HOSPITAL_COMMUNITY): Payer: 59

## 2015-08-24 DIAGNOSIS — Z794 Long term (current) use of insulin: Secondary | ICD-10-CM | POA: Insufficient documentation

## 2015-08-24 DIAGNOSIS — E119 Type 2 diabetes mellitus without complications: Secondary | ICD-10-CM | POA: Insufficient documentation

## 2015-08-24 DIAGNOSIS — M1990 Unspecified osteoarthritis, unspecified site: Secondary | ICD-10-CM | POA: Insufficient documentation

## 2015-08-24 DIAGNOSIS — G4730 Sleep apnea, unspecified: Secondary | ICD-10-CM | POA: Insufficient documentation

## 2015-08-24 DIAGNOSIS — Z6839 Body mass index (BMI) 39.0-39.9, adult: Secondary | ICD-10-CM | POA: Insufficient documentation

## 2015-08-24 DIAGNOSIS — E785 Hyperlipidemia, unspecified: Secondary | ICD-10-CM | POA: Diagnosis not present

## 2015-08-24 DIAGNOSIS — Z79899 Other long term (current) drug therapy: Secondary | ICD-10-CM | POA: Diagnosis not present

## 2015-08-24 DIAGNOSIS — Z466 Encounter for fitting and adjustment of urinary device: Secondary | ICD-10-CM | POA: Diagnosis not present

## 2015-08-24 DIAGNOSIS — N200 Calculus of kidney: Principal | ICD-10-CM

## 2015-08-24 DIAGNOSIS — Z791 Long term (current) use of non-steroidal anti-inflammatories (NSAID): Secondary | ICD-10-CM | POA: Insufficient documentation

## 2015-08-24 DIAGNOSIS — K219 Gastro-esophageal reflux disease without esophagitis: Secondary | ICD-10-CM | POA: Diagnosis not present

## 2015-08-24 DIAGNOSIS — Z7984 Long term (current) use of oral hypoglycemic drugs: Secondary | ICD-10-CM | POA: Diagnosis not present

## 2015-08-24 HISTORY — PX: NEPHROLITHOTOMY: SHX5134

## 2015-08-24 HISTORY — PX: CYSTOSCOPY WITH STENT PLACEMENT: SHX5790

## 2015-08-24 LAB — CBC
HEMATOCRIT: 35.5 % — AB (ref 36.0–46.0)
HEMOGLOBIN: 11.6 g/dL — AB (ref 12.0–15.0)
MCH: 30.4 pg (ref 26.0–34.0)
MCHC: 32.7 g/dL (ref 30.0–36.0)
MCV: 92.9 fL (ref 78.0–100.0)
Platelets: 315 10*3/uL (ref 150–400)
RBC: 3.82 MIL/uL — AB (ref 3.87–5.11)
RDW: 13.4 % (ref 11.5–15.5)
WBC: 10.6 10*3/uL — AB (ref 4.0–10.5)

## 2015-08-24 LAB — GLUCOSE, CAPILLARY
GLUCOSE-CAPILLARY: 157 mg/dL — AB (ref 65–99)
GLUCOSE-CAPILLARY: 271 mg/dL — AB (ref 65–99)
GLUCOSE-CAPILLARY: 272 mg/dL — AB (ref 65–99)
Glucose-Capillary: 142 mg/dL — ABNORMAL HIGH (ref 65–99)

## 2015-08-24 LAB — BASIC METABOLIC PANEL
ANION GAP: 8 (ref 5–15)
BUN: 23 mg/dL — ABNORMAL HIGH (ref 6–20)
CALCIUM: 8.7 mg/dL — AB (ref 8.9–10.3)
CHLORIDE: 108 mmol/L (ref 101–111)
CO2: 24 mmol/L (ref 22–32)
Creatinine, Ser: 0.84 mg/dL (ref 0.44–1.00)
GFR calc non Af Amer: >60 mL/min (ref >60)
GLUCOSE: 170 mg/dL — AB (ref 65–99)
POTASSIUM: 3.9 mmol/L (ref 3.5–5.1)
Sodium: 140 mmol/L (ref 135–145)

## 2015-08-24 SURGERY — NEPHROLITHOTOMY PERCUTANEOUS
Anesthesia: General | Laterality: Right

## 2015-08-24 MED ORDER — LACTATED RINGERS IV SOLN: INTRAVENOUS | Status: DC

## 2015-08-24 MED ORDER — MIDAZOLAM HCL 2 MG/2ML IJ SOLN
INTRAMUSCULAR | Status: AC
  Filled 2015-08-24: qty 2

## 2015-08-24 MED ORDER — LIDOCAINE HCL (CARDIAC) 20 MG/ML IV SOLN
INTRAVENOUS | Status: AC
  Filled 2015-08-24: qty 5

## 2015-08-24 MED ORDER — SODIUM CHLORIDE 0.9 % IV SOLN
INTRAVENOUS | Status: DC
  Administered 2015-08-24 – 2015-08-25 (×2): via INTRAVENOUS

## 2015-08-24 MED ORDER — IOPAMIDOL (ISOVUE-300) INJECTION 61%
INTRAVENOUS | Status: DC | PRN
  Administered 2015-08-24: 25 mL

## 2015-08-24 MED ORDER — CEFAZOLIN SODIUM-DEXTROSE 2-4 GM/100ML-% IV SOLN
INTRAVENOUS | Status: AC
  Filled 2015-08-24: qty 100

## 2015-08-24 MED ORDER — ROCURONIUM BROMIDE 100 MG/10ML IV SOLN
INTRAVENOUS | Status: AC
  Filled 2015-08-24: qty 1

## 2015-08-24 MED ORDER — ZOLPIDEM TARTRATE 5 MG PO TABS: 5.0000 mg | ORAL_TABLET | Freq: Every evening | ORAL | Status: DC | PRN

## 2015-08-24 MED ORDER — DEXAMETHASONE SODIUM PHOSPHATE 10 MG/ML IJ SOLN
INTRAMUSCULAR | Status: DC | PRN
  Administered 2015-08-24: 10 mg via INTRAVENOUS

## 2015-08-24 MED ORDER — LACTATED RINGERS IV SOLN
INTRAVENOUS | Status: DC
  Administered 2015-08-24 (×2): 1000 mL via INTRAVENOUS

## 2015-08-24 MED ORDER — SODIUM CHLORIDE 0.9 % IR SOLN
Status: DC | PRN
  Administered 2015-08-24: 12000 mL

## 2015-08-24 MED ORDER — IOPAMIDOL (ISOVUE-300) INJECTION 61%
INTRAVENOUS | Status: AC
  Filled 2015-08-24: qty 100

## 2015-08-24 MED ORDER — GLYCOPYRROLATE 0.2 MG/ML IJ SOLN
INTRAMUSCULAR | Status: DC | PRN
  Administered 2015-08-24: 0.4 mg via INTRAVENOUS

## 2015-08-24 MED ORDER — PROPOFOL 10 MG/ML IV BOLUS
INTRAVENOUS | Status: DC | PRN
  Administered 2015-08-24: 150 mg via INTRAVENOUS
  Administered 2015-08-24: 50 mg via INTRAVENOUS

## 2015-08-24 MED ORDER — NEOSTIGMINE METHYLSULFATE 10 MG/10ML IV SOLN
INTRAVENOUS | Status: AC
  Filled 2015-08-24: qty 1

## 2015-08-24 MED ORDER — ATORVASTATIN CALCIUM 20 MG PO TABS
20.0000 mg | ORAL_TABLET | Freq: Every day | ORAL | Status: DC
Start: 2015-08-24 — End: 2015-08-25
  Administered 2015-08-24: 20 mg via ORAL
  Filled 2015-08-24: qty 1

## 2015-08-24 MED ORDER — ACETAMINOPHEN 325 MG PO TABS
650.0000 mg | ORAL_TABLET | ORAL | Status: DC | PRN
  Administered 2015-08-25 (×2): 650 mg via ORAL
  Filled 2015-08-24 (×2): qty 2

## 2015-08-24 MED ORDER — METOPROLOL SUCCINATE ER 25 MG PO TB24
25.0000 mg | ORAL_TABLET | Freq: Every morning | ORAL | Status: DC
  Administered 2015-08-25: 25 mg via ORAL
  Filled 2015-08-24: qty 1

## 2015-08-24 MED ORDER — PROPOFOL 10 MG/ML IV BOLUS
INTRAVENOUS | Status: AC
  Filled 2015-08-24: qty 20

## 2015-08-24 MED ORDER — DIPHENHYDRAMINE HCL 12.5 MG/5ML PO ELIX
12.5000 mg | ORAL_SOLUTION | Freq: Four times a day (QID) | ORAL | Status: DC | PRN
Start: 2015-08-24 — End: 2015-08-25

## 2015-08-24 MED ORDER — CEFAZOLIN SODIUM 1-5 GM-% IV SOLN
1.0000 g | Freq: Three times a day (TID) | INTRAVENOUS | Status: AC
  Administered 2015-08-24 – 2015-08-25 (×2): 1 g via INTRAVENOUS
  Filled 2015-08-24 (×2): qty 50

## 2015-08-24 MED ORDER — FENTANYL CITRATE (PF) 100 MCG/2ML IJ SOLN
INTRAMUSCULAR | Status: DC | PRN
  Administered 2015-08-24 (×3): 50 ug via INTRAVENOUS

## 2015-08-24 MED ORDER — LOSARTAN POTASSIUM 25 MG PO TABS
25.0000 mg | ORAL_TABLET | Freq: Every morning | ORAL | Status: DC
Start: 2015-08-25 — End: 2015-08-25
  Administered 2015-08-25: 25 mg via ORAL
  Filled 2015-08-24: qty 1

## 2015-08-24 MED ORDER — GLYCOPYRROLATE 0.2 MG/ML IJ SOLN
INTRAMUSCULAR | Status: AC
  Filled 2015-08-24: qty 2

## 2015-08-24 MED ORDER — MIDAZOLAM HCL 5 MG/5ML IJ SOLN
INTRAMUSCULAR | Status: DC | PRN
  Administered 2015-08-24: 2 mg via INTRAVENOUS

## 2015-08-24 MED ORDER — INSULIN ASPART 100 UNIT/ML ~~LOC~~ SOLN
0.0000 [IU] | Freq: Three times a day (TID) | SUBCUTANEOUS | Status: DC
  Administered 2015-08-24: 8 [IU] via SUBCUTANEOUS
  Administered 2015-08-25 (×2): 3 [IU] via SUBCUTANEOUS

## 2015-08-24 MED ORDER — HYDROMORPHONE HCL 1 MG/ML IJ SOLN
0.5000 mg | INTRAMUSCULAR | Status: DC | PRN
  Administered 2015-08-24: 1 mg via INTRAVENOUS
  Filled 2015-08-24: qty 1

## 2015-08-24 MED ORDER — STERILE WATER FOR IRRIGATION IR SOLN
Status: DC | PRN
  Administered 2015-08-24: 1000 mL

## 2015-08-24 MED ORDER — HYOSCYAMINE SULFATE 0.125 MG SL SUBL
0.1250 mg | SUBLINGUAL_TABLET | SUBLINGUAL | Status: DC | PRN
  Filled 2015-08-24: qty 1

## 2015-08-24 MED ORDER — ROCURONIUM BROMIDE 100 MG/10ML IV SOLN
INTRAVENOUS | Status: DC | PRN
  Administered 2015-08-24: 50 mg via INTRAVENOUS

## 2015-08-24 MED ORDER — SUCCINYLCHOLINE CHLORIDE 20 MG/ML IJ SOLN
INTRAMUSCULAR | Status: DC | PRN
  Administered 2015-08-24: 100 mg via INTRAVENOUS

## 2015-08-24 MED ORDER — PANTOPRAZOLE SODIUM 40 MG PO TBEC
40.0000 mg | DELAYED_RELEASE_TABLET | Freq: Every day | ORAL | Status: DC
  Administered 2015-08-25: 40 mg via ORAL
  Filled 2015-08-24: qty 1

## 2015-08-24 MED ORDER — DULOXETINE HCL 60 MG PO CPEP
60.0000 mg | ORAL_CAPSULE | Freq: Every day | ORAL | Status: DC
  Administered 2015-08-24: 60 mg via ORAL
  Filled 2015-08-24: qty 1

## 2015-08-24 MED ORDER — DIPHENHYDRAMINE HCL 50 MG/ML IJ SOLN: 12.5000 mg | Freq: Four times a day (QID) | INTRAMUSCULAR | Status: DC | PRN

## 2015-08-24 MED ORDER — HYDROMORPHONE HCL 1 MG/ML IJ SOLN: 0.2500 mg | INTRAMUSCULAR | Status: DC | PRN

## 2015-08-24 MED ORDER — NABUMETONE 750 MG PO TABS
750.0000 mg | ORAL_TABLET | Freq: Two times a day (BID) | ORAL | Status: DC
  Administered 2015-08-24 – 2015-08-25 (×2): 750 mg via ORAL
  Filled 2015-08-24 (×2): qty 1

## 2015-08-24 MED ORDER — ONDANSETRON HCL 4 MG/2ML IJ SOLN
INTRAMUSCULAR | Status: DC | PRN
  Administered 2015-08-24: 4 mg via INTRAVENOUS

## 2015-08-24 MED ORDER — NEOSTIGMINE METHYLSULFATE 10 MG/10ML IV SOLN
INTRAVENOUS | Status: DC | PRN
  Administered 2015-08-24: 3 mg via INTRAVENOUS

## 2015-08-24 MED ORDER — GABAPENTIN 300 MG PO CAPS
300.0000 mg | ORAL_CAPSULE | Freq: Every day | ORAL | Status: DC
  Administered 2015-08-25: 300 mg via ORAL
  Filled 2015-08-24: qty 1

## 2015-08-24 MED ORDER — FENTANYL CITRATE (PF) 250 MCG/5ML IJ SOLN
INTRAMUSCULAR | Status: AC
  Filled 2015-08-24: qty 5

## 2015-08-24 MED ORDER — ONDANSETRON HCL 4 MG/2ML IJ SOLN
4.0000 mg | INTRAMUSCULAR | Status: DC | PRN
  Administered 2015-08-24: 4 mg via INTRAVENOUS
  Filled 2015-08-24: qty 2

## 2015-08-24 MED ORDER — CEFAZOLIN SODIUM-DEXTROSE 2-4 GM/100ML-% IV SOLN
2.0000 g | INTRAVENOUS | Status: AC
  Administered 2015-08-24: 2 g via INTRAVENOUS
  Filled 2015-08-24: qty 100

## 2015-08-24 MED ORDER — ARTIFICIAL TEARS OP OINT
TOPICAL_OINTMENT | OPHTHALMIC | Status: AC
  Filled 2015-08-24: qty 3.5

## 2015-08-24 MED ORDER — GABAPENTIN 300 MG PO CAPS
600.0000 mg | ORAL_CAPSULE | Freq: Every day | ORAL | Status: DC
  Administered 2015-08-24: 600 mg via ORAL
  Filled 2015-08-24: qty 2

## 2015-08-24 MED ORDER — LIDOCAINE HCL (PF) 2 % IJ SOLN
INTRAMUSCULAR | Status: DC | PRN
  Administered 2015-08-24: 50 mg via INTRADERMAL

## 2015-08-24 MED ORDER — OXYCODONE-ACETAMINOPHEN 5-325 MG PO TABS: 1.0000 | ORAL_TABLET | ORAL | Status: DC | PRN

## 2015-08-24 MED ORDER — ONDANSETRON HCL 4 MG/2ML IJ SOLN
INTRAMUSCULAR | Status: AC
  Filled 2015-08-24: qty 2

## 2015-08-24 MED FILL — ATORVASTATIN 20 MG TABLET: 20 | 90 days supply | Qty: 90 | Fill #1

## 2015-08-24 MED FILL — traMADol HCL 50 MG TABS: 50 | 30 days supply | Qty: 60 | Fill #2

## 2015-08-24 MED FILL — PIOGLITAZONE HCL 30 MG TAB: 30 | 90 days supply | Qty: 90 | Fill #1

## 2015-08-24 MED FILL — DULoxetine HCL 60 MG CPEP: 60 | 90 days supply | Qty: 90 | Fill #1

## 2015-08-24 MED FILL — LOSARTAN POTASSIUM 25 MG TA: 25 | 90 days supply | Qty: 90 | Fill #1

## 2015-08-24 MED FILL — GLIMEPIRIDE 4 MG TABLET: 4 | 90 days supply | Qty: 180 | Fill #1

## 2015-08-24 MED FILL — OMEPRAZOLE DR 40 MG CAPSULE: 40 | 90 days supply | Qty: 180 | Fill #1

## 2015-08-24 MED FILL — METFORMIN HCL ER 500 MG TAB: 500 | 90 days supply | Qty: 90 | Fill #1

## 2015-08-24 MED FILL — NABUMETONE 750 MG TABLET: 750 | 90 days supply | Qty: 180 | Fill #1

## 2015-08-24 MED FILL — METOPROLOL SUCC ER 25 MG TA: 25 | 90 days supply | Qty: 90 | Fill #1

## 2015-08-24 SURGICAL SUPPLY — 65 items
BAG URINE DRAINAGE (UROLOGICAL SUPPLIES) IMPLANT
BAG URO CATCHER STRL LF (MISCELLANEOUS) ×2 IMPLANT
BASKET STONE NITINOL 3FRX115MB (UROLOGICAL SUPPLIES) IMPLANT
BASKET ZERO TIP NITINOL 2.4FR (BASKET) IMPLANT
BENZOIN TINCTURE PRP APPL 2/3 (GAUZE/BANDAGES/DRESSINGS) ×2 IMPLANT
BLADE SURG 15 STRL LF DISP TIS (BLADE) ×1 IMPLANT
BLADE SURG 15 STRL SS (BLADE) ×1
CATH FOLEY 2W COUNCIL 20FR 5CC (CATHETERS) IMPLANT
CATH FOLEY 2W COUNCIL 5CC 18FR (CATHETERS) ×2 IMPLANT
CATH FOLEY 2WAY SLVR  5CC 16FR (CATHETERS)
CATH FOLEY 2WAY SLVR  5CC 18FR (CATHETERS)
CATH FOLEY 2WAY SLVR 5CC 16FR (CATHETERS) IMPLANT
CATH FOLEY 2WAY SLVR 5CC 18FR (CATHETERS) IMPLANT
CATH IMAGER II 65CM (CATHETERS) ×2 IMPLANT
CATH INTERMIT  6FR 70CM (CATHETERS) ×2 IMPLANT
CATH ROBINSON RED A/P 20FR (CATHETERS) IMPLANT
CATH X-FORCE N30 NEPHROSTOMY (TUBING) ×2 IMPLANT
CLOTH BEACON ORANGE TIMEOUT ST (SAFETY) ×2 IMPLANT
COVER SURGICAL LIGHT HANDLE (MISCELLANEOUS) ×2 IMPLANT
DRAPE C-ARM 42X120 X-RAY (DRAPES) ×2 IMPLANT
DRAPE LINGEMAN PERC (DRAPES) ×2 IMPLANT
DRAPE SHEET LG 3/4 BI-LAMINATE (DRAPES) IMPLANT
DRAPE SURG IRRIG POUCH 19X23 (DRAPES) ×2 IMPLANT
DRSG PAD ABDOMINAL 8X10 ST (GAUZE/BANDAGES/DRESSINGS) ×4 IMPLANT
DRSG TEGADERM 8X12 (GAUZE/BANDAGES/DRESSINGS) ×2 IMPLANT
FIBER LASER FLEXIVA 1000 (UROLOGICAL SUPPLIES) IMPLANT
FIBER LASER FLEXIVA 200 (UROLOGICAL SUPPLIES) IMPLANT
FIBER LASER FLEXIVA 365 (UROLOGICAL SUPPLIES) IMPLANT
FIBER LASER FLEXIVA 550 (UROLOGICAL SUPPLIES) IMPLANT
FIBER LASER TRAC TIP (UROLOGICAL SUPPLIES) IMPLANT
GAUZE SPONGE 4X4 12PLY STRL (GAUZE/BANDAGES/DRESSINGS) ×2 IMPLANT
GLOVE BIO SURGEON STRL SZ8 (GLOVE) ×6 IMPLANT
GLOVE BIOGEL M 8.0 STRL (GLOVE) ×2 IMPLANT
GLOVE BIOGEL PI IND STRL 8 (GLOVE) ×1 IMPLANT
GLOVE BIOGEL PI INDICATOR 8 (GLOVE) ×1
GOWN STRL REUS W/TWL LRG LVL3 (GOWN DISPOSABLE) ×4 IMPLANT
GOWN STRL REUS W/TWL XL LVL3 (GOWN DISPOSABLE) ×2 IMPLANT
GUIDEWIRE AMPLAZ .035X145 (WIRE) ×2 IMPLANT
GUIDEWIRE STR DUAL SENSOR (WIRE) ×4 IMPLANT
INSERT SILICONE FOR G14464 (MISCELLANEOUS) ×2 IMPLANT
IV SET EXTENSION CATH 6 NF (IV SETS) ×2 IMPLANT
KIT BASIN OR (CUSTOM PROCEDURE TRAY) ×2 IMPLANT
MANIFOLD NEPTUNE II (INSTRUMENTS) ×2 IMPLANT
NEEDLE TROCAR 18X15 ECHO (NEEDLE) IMPLANT
NEEDLE TROCAR 18X20 (NEEDLE) IMPLANT
NS IRRIG 1000ML POUR BTL (IV SOLUTION) IMPLANT
PACK CYSTO (CUSTOM PROCEDURE TRAY) ×2 IMPLANT
PAD ABD 8X10 STRL (GAUZE/BANDAGES/DRESSINGS) ×2 IMPLANT
PROBE LITHOCLAST ULTRA 3.8X403 (UROLOGICAL SUPPLIES) ×2 IMPLANT
PROBE PNEUMATIC 1.0MMX570MM (UROLOGICAL SUPPLIES) ×2 IMPLANT
SET AMPLATZ RENAL DILATOR (MISCELLANEOUS) IMPLANT
SET IRRIG Y TYPE TUR BLADDER L (SET/KITS/TRAYS/PACK) IMPLANT
SHEATH PEELAWAY SET 9 (SHEATH) ×2 IMPLANT
SPONGE LAP 4X18 X RAY DECT (DISPOSABLE) ×2 IMPLANT
STENT CONTOUR 6FRX26X.038 (STENTS) ×2 IMPLANT
STONE CATCHER W/TUBE ADAPTER (UROLOGICAL SUPPLIES) ×2 IMPLANT
SUT SILK 2 0 30  PSL (SUTURE) ×2
SUT SILK 2 0 30 PSL (SUTURE) ×2 IMPLANT
SYR 20CC LL (SYRINGE) ×4 IMPLANT
SYRINGE 10CC LL (SYRINGE) IMPLANT
SYRINGE 60CC LL (MISCELLANEOUS) ×2 IMPLANT
TOWEL OR 17X26 10 PK STRL BLUE (TOWEL DISPOSABLE) ×2 IMPLANT
TRAY FOLEY W/METER SILVER 16FR (SET/KITS/TRAYS/PACK) ×2 IMPLANT
TUBING CONNECTING 10 (TUBING) ×2 IMPLANT
WATER STERILE IRR 1500ML POUR (IV SOLUTION) IMPLANT

## 2015-08-24 NOTE — Anesthesia Procedure Notes (Signed)
Procedure Name: Intubation Date/Time: 08/24/2015 1:07 PM Performed by: Lajuana Carry E Pre-anesthesia Checklist: Patient identified, Emergency Drugs available, Suction available and Patient being monitored Patient Re-evaluated:Patient Re-evaluated prior to inductionOxygen Delivery Method: Circle System Utilized Preoxygenation: Pre-oxygenation with 100% oxygen Intubation Type: IV induction Ventilation: Mask ventilation without difficulty and Oral airway inserted - appropriate to patient size Laryngoscope Size: Glidescope and 4 (Grade 3-4 view w/ Sabra Heck 3, change to Glidescope electively) Grade View: Grade I Tube type: Oral Tube size: 7.0 mm Number of attempts: 3 Airway Equipment and Method: Stylet,  Oral airway and Video-laryngoscopy Placement Confirmation: ETT inserted through vocal cords under direct vision,  positive ETCO2 and breath sounds checked- equal and bilateral Secured at: 21 cm Tube secured with: Tape Dental Injury: Teeth and Oropharynx as per pre-operative assessment  Difficulty Due To: Difficult Airway- due to anterior larynx and Difficult Airway- due to limited oral opening Comments: First look with Sabra Heck w/limited view, change to Glidescope, Anterior a/w, limited mouth opening, First attempt w/ glidescope unable to pass ETT d/t cords closed, muscle relaxant given and masked ventilated w/o difficulty, ETT placed atraumatically, secured in place w/tape.

## 2015-08-24 NOTE — Interval H&P Note (Signed)
History and Physical Interval Note:  08/24/2015 12:43 PM  [name]  has presented today for surgery, with the diagnosis of RIGHT RENAL CALCULUS  The various methods of treatment have been discussed with the patient and family. After consideration of risks, benefits and other options for treatment, the patient has consented to  Procedure(s): NEPHROLITHOTOMY PERCUTANEOUS WITH SURGEON TO GET ACCESS (Right) CYSTOSCOPY WITH STENT PLACEMENT (Right) HOLMIUM LASER APPLICATION (Right) as a surgical intervention .  The patient's history has been reviewed, patient examined, no change in status, stable for surgery.  I have reviewed the patient's chart and labs.  Questions were answered to the patient's satisfaction.    Exam: RRR CTAB  Nicolette Bang

## 2015-08-24 NOTE — Anesthesia Postprocedure Evaluation (Signed)
Anesthesia Post Note  Patient: Clella Mccosh  Procedure(s) Performed: Procedure(s) (LRB): NEPHROLITHOTOMY PERCUTANEOUS WITH SURGEON TO GET ACCESS (Right) CYSTOSCOPY WITH STENT PLACEMENT (Right)  Patient location during evaluation: PACU Anesthesia Type: General Level of consciousness: awake and alert Pain management: pain level controlled Vital Signs Assessment: post-procedure vital signs reviewed and stable Respiratory status: spontaneous breathing, nonlabored ventilation, respiratory function stable and patient connected to nasal cannula oxygen Cardiovascular status: blood pressure returned to baseline and stable Postop Assessment: no signs of nausea or vomiting Anesthetic complications: no    Last Vitals:  Filed Vitals:   08/24/15 1615 08/24/15 1629  BP: 113/69 136/74  Pulse: 86 88  Temp: 36.7 C 36.6 C  Resp: 21 16    Last Pain:  Filed Vitals:   08/24/15 1630  PainSc: 3                  Adelie Croswell L

## 2015-08-24 NOTE — Anesthesia Preprocedure Evaluation (Addendum)
Anesthesia Evaluation  Patient identified by MRN, date of birth, ID band Patient awake    Reviewed: Allergy & Precautions, H&P , NPO status , Patient's Chart, lab work & pertinent test results, reviewed documented beta blocker date and time   Airway Mallampati: III  TM Distance: >3 FB Neck ROM: full    Dental no notable dental hx. (+) Dental Advisory Given, Teeth Intact   Pulmonary asthma , sleep apnea ,    Pulmonary exam normal breath sounds clear to auscultation       Cardiovascular Exercise Tolerance: Good negative cardio ROS Normal cardiovascular exam Rhythm:regular Rate:Normal     Neuro/Psych Cervical spinal stenosis negative neurological ROS  negative psych ROS   GI/Hepatic negative GI ROS, Neg liver ROS, GERD  Medicated and Controlled,  Endo/Other  diabetes, Well Controlled, Type 2, Oral Hypoglycemic AgentsMorbid obesity  Renal/GU negative Renal ROS  negative genitourinary   Musculoskeletal   Abdominal (+) + obese,   Peds  Hematology negative hematology ROS (+)   Anesthesia Other Findings   Reproductive/Obstetrics negative OB ROS                            Anesthesia Physical Anesthesia Plan  ASA: III  Anesthesia Plan: General   Post-op Pain Management:    Induction: Intravenous  Airway Management Planned: Oral ETT  Additional Equipment:   Intra-op Plan:   Post-operative Plan: Extubation in OR  Informed Consent: I have reviewed the patients History and Physical, chart, labs and discussed the procedure including the risks, benefits and alternatives for the proposed anesthesia with the patient or authorized representative who has indicated his/her understanding and acceptance.   Dental Advisory Given  Plan Discussed with: CRNA and Surgeon  Anesthesia Plan Comments:         Anesthesia Quick Evaluation

## 2015-08-24 NOTE — H&P (View-Only) (Signed)
08/02/2015 9:25 AM   [name] 03-26-1961 [id number]  Referring provider: Adin Hector, MD [facility] [place], [place] [address]  Chief Complaint  Patient presents with  . Follow-up    diagnose     HPI: [name] is a 55yo seen in followup for recurrent UTUI and hematuria. She underwent CT scan which showed a 67mm x 61mm right renal pelvis stone. She denies any pain   PMH: Past Medical History  Diagnosis Date  . Heartburn   . Arthritis   . Diabetes ([place])   . HLD (hyperlipidemia)   . Sleep apnea   . Cervical spinal stenosis   . Lumbar stenosis   . History of kidney stones   . Headache, migraine 08/02/2015  . Asthma without status asthmaticus 08/02/2015    Overview:  mild, not requiring treatment   . Degeneration of intervertebral disc of cervical region 04/28/2015  . Kidney stones 07/18/2015    Surgical History: Past Surgical History  Procedure Laterality Date  . Cervical fusion  2009  . Carpal tunnel release  1996  . Tubal ligation      Home Medications:    Medication List       This list is accurate as of: 08/02/15  9:25 AM.  Always use your most recent med list.               atorvastatin 20 MG tablet  Commonly known as:  LIPITOR  Take by mouth.     DULoxetine 60 MG capsule  Commonly known as:  CYMBALTA  Take by mouth.     gabapentin 300 MG capsule  Commonly known as:  NEURONTIN  Take by mouth.     glimepiride 4 MG tablet  Commonly known as:  AMARYL  Take by mouth.     HYDROcodone-acetaminophen 5-325 MG tablet  Commonly known as:  NORCO/VICODIN     levofloxacin 500 MG tablet  Commonly known as:  LEVAQUIN  Reported on 08/02/2015     losartan 25 MG tablet  Commonly known as:  COZAAR  Take by mouth.     metFORMIN 500 MG 24 hr tablet  Commonly known as:  GLUCOPHAGE-XR  Take by mouth.     metoprolol succinate 25 MG 24 hr tablet  Commonly known as:  TOPROL-XL  Take by mouth.     nabumetone 750 MG  tablet  Commonly known as:  RELAFEN  Take by mouth.     omeprazole 40 MG capsule  Commonly known as:  PRILOSEC  Take by mouth.     ondansetron 4 MG tablet  Commonly known as:  ZOFRAN  Take by mouth.     pioglitazone 30 MG tablet  Commonly known as:  ACTOS  Take by mouth.     traMADol 50 MG tablet  Commonly known as:  ULTRAM  Take by mouth.     TRESIBA FLEXTOUCH 200 UNIT/ML Sopn  Generic drug:  Insulin Degludec  Inject 28 Units into the skin.     TRULICITY 1.5 0000000 Sopn  Generic drug:  Dulaglutide  Inject into the skin.     UNIFINE PENTIPS 31G X 5 MM Misc  Generic drug:  Insulin Pen Needle     zolpidem 5 MG tablet  Commonly known as:  AMBIEN  Take by mouth.        Allergies:  Allergies  Allergen Reactions  . Biaxin [Clarithromycin] Nausea And Vomiting and Nausea Only    Family History: Family History  Problem Relation Age of Onset  . Kidney disease Neg Hx   . Bladder Cancer Neg Hx   . Heart failure Mother   . Heart attack Father     Social History:  reports that she has never smoked. She has never used smokeless tobacco. She reports that she drinks alcohol. She reports that she does not use illicit drugs.  ROS: UROLOGY Frequent Urination?: No Hard to postpone urination?: No Burning/pain with urination?: No Get up at night to urinate?: No Leakage of urine?: No Urine stream starts and stops?: No Trouble starting stream?: No Do you have to strain to urinate?: No Blood in urine?: No Urinary tract infection?: No Sexually transmitted disease?: No Injury to kidneys or bladder?: No Painful intercourse?: No Weak stream?: No Currently pregnant?: No Vaginal bleeding?: No Last menstrual period?: No  Gastrointestinal Nausea?: No Vomiting?: No Indigestion/heartburn?: No Diarrhea?: No Constipation?: No  Constitutional Fever: No Night sweats?: No Weight loss?: No Fatigue?: No  Skin Skin rash/lesions?: No Itching?: No  Eyes Blurred  vision?: No Double vision?: No  Ears/Nose/Throat Sore throat?: No Sinus problems?: No  Hematologic/Lymphatic Swollen glands?: No Easy bruising?: No  Cardiovascular Leg swelling?: No Chest pain?: No  Respiratory Cough?: No Shortness of breath?: No  Endocrine Excessive thirst?: No  Musculoskeletal Back pain?: No Joint pain?: No  Neurological Headaches?: No Dizziness?: No  Psychologic Depression?: No Anxiety?: No  Physical Exam: BP 129/81 mmHg  Pulse 81  Ht 5' 3.5" (1.613 m)  Wt 104.69 kg (230 lb 12.8 oz)  BMI 40.24 kg/m2  Constitutional:  Alert and oriented, No acute distress. HEENT: [place] AT, moist mucus membranes.  Trachea midline, no masses. Cardiovascular: No clubbing, cyanosis, or edema. RRR Respiratory: Normal respiratory effort, no increased work of breathing. CTAB GI: Abdomen is soft, nontender, nondistended, no abdominal masses GU: No CVA tenderness.  Skin: No rashes, bruises or suspicious lesions. Lymph: No cervical or inguinal adenopathy. Neurologic: Grossly intact, no focal deficits, moving all 4 extremities. Psychiatric: Normal mood and affect.  Laboratory Data: Lab Results  Component Value Date   WBC 8.1 09/05/2007   HGB 13.2 09/05/2007   HCT 38.6 09/05/2007   MCV 92.3 09/05/2007   PLT 410* 09/05/2007    Lab Results  Component Value Date   CREATININE 0.83 07/15/2015    No results found for: PSA  No results found for: TESTOSTERONE  No results found for: HGBA1C  Urinalysis    Component Value Date/Time   GLUCOSEU Negative 07/15/2015 0931   BILIRUBINUR Negative 07/15/2015 0931   NITRITE Negative 07/15/2015 0931   LEUKOCYTESUR 2+* 07/15/2015 0931    Pertinent Imaging: Ct scan w/wo contrast  Assessment & Plan:   1. Kidney stones -schedule for R PCNL - Urinalysis, Complete   No Follow-up on file.  Cleon Gustin, [facility] [address] [place], [place] [address] [phone]

## 2015-08-24 NOTE — Transfer of Care (Signed)
Immediate Anesthesia Transfer of Care Note  Patient: [name]  Procedure(s) Performed: Procedure(s): NEPHROLITHOTOMY PERCUTANEOUS WITH SURGEON TO GET ACCESS (Right) CYSTOSCOPY WITH STENT PLACEMENT (Right)  Patient Location: PACU  Anesthesia Type:General  Level of Consciousness:  sedated, patient cooperative and responds to stimulation  Airway & Oxygen Therapy:Patient Spontanous Breathing and Patient connected to face mask oxgen  Post-op Assessment:  Report given to PACU RN and Post -op Vital signs reviewed and stable  Post vital signs:  Reviewed and stable  Last Vitals:  Filed Vitals:   08/24/15 1041 08/24/15 1522  BP: 135/72 131/73  Pulse: 84 93  Temp: 36.7 C   Resp: 16 18    Complications: No apparent anesthesia complications

## 2015-08-24 NOTE — Brief Op Note (Signed)
08/24/2015  2:59 PM  PATIENT:  [name]  55 y.o. female  PRE-OPERATIVE DIAGNOSIS:  RIGHT RENAL CALCULUS  POST-OPERATIVE DIAGNOSIS:  RIGHT RENAL CALCULUS  PROCEDURE:  Procedure(s): NEPHROLITHOTOMY PERCUTANEOUS WITH SURGEON TO GET ACCESS (Right) CYSTOSCOPY WITH STENT PLACEMENT (Right)  SURGEON:  Surgeon(s) and Role:    * Cleon Gustin, MD - Primary  PHYSICIAN ASSISTANT:   ASSISTANTS: none   ANESTHESIA:   general  EBL:  Total I/O In: -  Out: 450 [Urine:300; Blood:150]  BLOOD ADMINISTERED:none  DRAINS: Urinary Catheter (Foley), 18 French nephrostomy, 6x26 JJ ureteral stent  LOCAL MEDICATIONS USED:  NONE  SPECIMEN:  Source of Specimen:  right renal calculus  DISPOSITION OF SPECIMEN:  N/A  COUNTS:  YES  TOURNIQUET:  * No tourniquets in log *  DICTATION: .Note written in EPIC  PLAN OF CARE: Admit for overnight observation  PATIENT DISPOSITION:  PACU - hemodynamically stable.   Delay start of Pharmacological VTE agent (>24hrs) due to surgical blood loss or risk of bleeding: not applicable

## 2015-08-24 NOTE — Progress Notes (Signed)
Decreased output out of foley and neph tube. 10cc over 2 hours. MD notified, orders to flush nephrostomy tube and catheter with 10cc of saline. Patient tolerated well. Bladder scan revealed 122 cc in bladder. After flushing foley with 25cc urine started to drain. 200cc drained after flushing. Patient states she feels more comfortable.  Barbee Shropshire. Brigitte Pulse, RN

## 2015-08-24 NOTE — OR Nursing (Signed)
Stone taken per Dr. McKenzie. 

## 2015-08-25 DIAGNOSIS — E119 Type 2 diabetes mellitus without complications: Secondary | ICD-10-CM | POA: Diagnosis not present

## 2015-08-25 DIAGNOSIS — E785 Hyperlipidemia, unspecified: Secondary | ICD-10-CM | POA: Diagnosis not present

## 2015-08-25 DIAGNOSIS — Z79899 Other long term (current) drug therapy: Secondary | ICD-10-CM | POA: Diagnosis not present

## 2015-08-25 DIAGNOSIS — K219 Gastro-esophageal reflux disease without esophagitis: Secondary | ICD-10-CM | POA: Diagnosis not present

## 2015-08-25 DIAGNOSIS — N200 Calculus of kidney: Secondary | ICD-10-CM | POA: Diagnosis not present

## 2015-08-25 DIAGNOSIS — Z791 Long term (current) use of non-steroidal anti-inflammatories (NSAID): Secondary | ICD-10-CM | POA: Diagnosis not present

## 2015-08-25 DIAGNOSIS — G4730 Sleep apnea, unspecified: Secondary | ICD-10-CM | POA: Diagnosis not present

## 2015-08-25 DIAGNOSIS — M1990 Unspecified osteoarthritis, unspecified site: Secondary | ICD-10-CM | POA: Diagnosis not present

## 2015-08-25 LAB — BASIC METABOLIC PANEL
ANION GAP: 7 (ref 5–15)
BUN: 14 mg/dL (ref 6–20)
CO2: 25 mmol/L (ref 22–32)
Calcium: 9.1 mg/dL (ref 8.9–10.3)
Chloride: 108 mmol/L (ref 101–111)
Creatinine, Ser: 0.7 mg/dL (ref 0.44–1.00)
GFR calc Af Amer: >60 mL/min (ref >60)
GLUCOSE: 218 mg/dL — AB (ref 65–99)
POTASSIUM: 3.9 mmol/L (ref 3.5–5.1)
Sodium: 140 mmol/L (ref 135–145)

## 2015-08-25 LAB — CBC
HEMATOCRIT: 33.8 % — AB (ref 36.0–46.0)
Hemoglobin: 10.9 g/dL — ABNORMAL LOW (ref 12.0–15.0)
MCH: 29.7 pg (ref 26.0–34.0)
MCHC: 32.2 g/dL (ref 30.0–36.0)
MCV: 92.1 fL (ref 78.0–100.0)
PLATELETS: 363 10*3/uL (ref 150–400)
RBC: 3.67 MIL/uL — AB (ref 3.87–5.11)
RDW: 13.3 % (ref 11.5–15.5)
WBC: 11.9 10*3/uL — AB (ref 4.0–10.5)

## 2015-08-25 LAB — GLUCOSE, CAPILLARY
GLUCOSE-CAPILLARY: 165 mg/dL — AB (ref 65–99)
GLUCOSE-CAPILLARY: 194 mg/dL — AB (ref 65–99)

## 2015-08-25 MED ORDER — ONDANSETRON HCL 4 MG PO TABS
4.0000 mg | ORAL_TABLET | Freq: Three times a day (TID) | ORAL | Status: DC | PRN
Start: 2015-08-25 — End: 2022-10-17

## 2015-08-25 MED ORDER — OXYCODONE-ACETAMINOPHEN 5-325 MG PO TABS: 1.0000 | ORAL_TABLET | ORAL | Status: DC | PRN

## 2015-08-25 MED FILL — ONDANSETRON HCL 4 MG TABLET: 4 | 7 days supply | Qty: 20 | Fill #0

## 2015-08-25 MED FILL — OXYCODONE/APAP 5-325: 5-325 | 3 days supply | Qty: 30 | Fill #0

## 2015-08-25 NOTE — Care Management Note (Signed)
Case Management Note  Patient Details  Name: [name] MRN: [id number] Date of Birth: [birth year]  Subjective/Objective: 55 y/o f admitted w/Renal Calculi,s/p surgery. From home.                   Action/Plan:d/c plan home.   Expected Discharge Date:                  Expected Discharge Plan:  Home/Self Care  In-House Referral:     Discharge planning Services  CM Consult  Post Acute Care Choice:    Choice offered to:     DME Arranged:    DME Agency:     HH Arranged:    HH Agency:     Status of Service:  In process, will continue to follow  Medicare Important Message Given:    Date Medicare IM Given:    Medicare IM give by:    Date Additional Medicare IM Given:    Additional Medicare Important Message give by:     If discussed at [place] of Stay Meetings, dates discussed:    Additional Comments:  Dessa Phi, RN 08/25/2015, 11:42 AM

## 2015-08-25 NOTE — Care Management Note (Signed)
Case Management Note  Patient Details  Name: [name] MRN: [id number] Date of Birth: [birth year]  Subjective/Objective:                    Action/Plan:d/c home no needs or orders.   Expected Discharge Date:                  Expected Discharge Plan:  Home/Self Care  In-House Referral:     Discharge planning Services  CM Consult  Post Acute Care Choice:    Choice offered to:     DME Arranged:    DME Agency:     HH Arranged:    [place] Agency:     Status of Service:  Completed, signed off  Medicare Important Message Given:    Date Medicare IM Given:    Medicare IM give by:    Date Additional Medicare IM Given:    Additional Medicare Important Message give by:     If discussed at [place] of Stay Meetings, dates discussed:    Additional Comments:  Dessa Phi, RN 08/25/2015, 12:23 PM

## 2015-08-25 NOTE — Progress Notes (Signed)
Reviewed discharge information with patient and caregiver. Answered all questions. Patient/caregiver able to teach back medications and reasons to contact MD/911. Caregiver/pt demonstrates understanding of draining nephrostomy tube and changing dressing/ observing for s/s of infection . Patient verbalizes importance of PCP follow up appointment.  Barbee Shropshire. Brigitte Pulse, RN

## 2015-08-25 NOTE — Op Note (Signed)
Preoperative diagnosis: Right renal stone  Postoperative diagnosis: Same  Procedure 1.  Right percutaneous nephrostolithotomy for stone greater than 2 cm 2.  Right nephrostogram 3.  Intraoperative fluoroscopy, under 1 hour, with interpretation 4.  Placement of a 6 x 26 double-J ureteral stent. 5.  Percutaneous access into the Right renal collecting system 6.  Placement of a 2 French nephrostomy tube 7.  Dilation of percutaneous tract  Attending: Dr. Alyson Ingles  Anesthesia: General  Estimated blood loss: 150cc  Antibiotics: ancef  Drains: 1.  37 French Foley catheter 2.  6 x 26 Right double-J ureteral stent 3.  18 French nephrostomy tube  Specimens: Stone for analysis  Findings: 2.5cm right renal pelvis calculus  Indications: Patient is a 55 year old female with a history of large right renal stone.  After discussing treatment options and they decided to proceed with right percutaneous nephrostolithotomy.    Procedure in detail: Prior to procedure consent was obtained.  Patient was brought to the operating room and a timeout was done to ensure correct patient, correct procedure, and correct site.  General anesthesia was administered. The patients genetalia was prepped and draped. A flexible cystoscope was passed into the urethra and then the bladder. No masses or lesions were seen in the bladder. Ureteral orifices were in the normal anatomic location. A sensor wire was passed into the right ureter and up to the renal pelvis. A 6 french ureteral catheter was advanced over the wire and up to the renal pelvis the wire was then removed and so was the cystoscope.  A 16 French Foley catheter was in place and the ureteral catheter was secured with a 0 silk tie.  The patient was then placed in the prone position.   The right flank was then prepped and draped in usual sterile fashion.  Contrast was instilled throught the ureteral catheter and the bullseye technique was used to gain access into the  lower pole. Once we gained access a sensor wire was advanced through the spinal needle and down the ureter to the bladder. We then used an 8 french and 10 french dilater to dilate the tract. We then used a sheath to place a second wire down to the bladder.  We then made an incision at the level of the skin and over the wire we then placed a NephroMax dilator.  We dilated the nephrostomy tract to 30 [place] and held this 18 cm of water for 1 minute.  We then  placed the access sheath over the balloon.  The balloon was then deflated.  We then used a rigid nephroscope to perform nephroscopy.  We encountered a large lower pole stone. Using the LithoClast the stone was fragmented and the fragments were removed with graspers.  The stone fragments were sent for composition analysis.  We then removed the nephroscope and over the wire placed a 6 x 26 double-J ureteral stent.  The wire was then removed and good coil was noted in the renal pelvis under direct vision in the bladder under fluoroscopy.  We then placed a 18 French nephrostomy tube through the sheath into the renal pelvis.  The balloon was inflated with 3 mls of contrast.  We then removed the access sheath in and obtained another nephrostogram.  We noted minimal extravasation of contrast.  We then secured the nephrostomy tube with 0 silks in interrupted fashion.  Dressing was placed over the nephrostomy tube site and this then concluded the procedure was well-tolerated by the patient.  Complications:  None  Condition: Stable, extubated, transferred to PACU  Plan: Patient is to be admitted overnight for observation.  Foley catheter through the morning.  Pt is then to be discharged home and followup in 1 week for nephrostomy tube removal. Stent to be removed 1 week after nephrostomy tube

## 2015-08-28 NOTE — Discharge Summary (Signed)
Physician Discharge Summary  Patient ID: [name] MRN: [id number] DOB/AGE: 55-Mar-1962 55 y.o.  Admit date: 08/24/2015 Discharge date: 08/25/2015  Admission Diagnoses: Renal calculi Discharge Diagnoses:  Active Problems:   Renal calculi   Discharged Condition: good  Hospital Course: The patient tolerated the procedure well and was transferred to the floor on IV pain meds, IV fluid. On POD#1 foley was removed, pt was started on clear liquid diet and they ambulated in the halls.  Prior to discharge the pt was tolerating a regular diet, pain was controlled on PO pain meds, they were ambulating without difficulty, and they had normal bowel function.   Consults: None  Significant Diagnostic Studies: none  Treatments: surgery: R PCNL  Discharge Exam: Blood pressure 152/80, pulse 103, temperature 98.1 F (36.7 C), temperature source Oral, resp. rate 16, height 5\' 4"  (1.626 m), weight 103.42 kg (228 lb), SpO2 95 %. General appearance: alert, cooperative and appears stated age Head: Normocephalic, without obvious abnormality, atraumatic Eyes: conjunctivae/corneas clear. PERRL, EOM's intact. Fundi benign. Back: symmetric, no curvature. ROM normal. No CVA tenderness. Resp: clear to auscultation bilaterally Cardio: regular rate and rhythm, S1, S2 normal, no murmur, click, rub or gallop GI: soft, non-tender; bowel sounds normal; no masses,  no organomegaly Extremities: extremities normal, atraumatic, no cyanosis or edema  Disposition: 01-Home or Self Care     Medication List    TAKE these medications        acetaminophen 500 MG tablet  Commonly known as:  TYLENOL  Take 1,000 mg by mouth 3 (three) times daily as needed (Pain).     atorvastatin 20 MG tablet  Commonly known as:  LIPITOR  Take 20 mg by mouth at bedtime.     DULoxetine 60 MG capsule  Commonly known as:  CYMBALTA  Take 60 mg by mouth at bedtime.     gabapentin 300 MG capsule  Commonly known as:  NEURONTIN   Take 300-600 mg by mouth 2 (two) times daily. 300 mg in the morning and 600 mg at night     glimepiride 4 MG tablet  Commonly known as:  AMARYL  Take 4 mg by mouth 2 (two) times daily.     HYDROcodone-acetaminophen 5-325 MG tablet  Commonly known as:  NORCO/VICODIN  Take 1 tablet by mouth daily.     losartan 25 MG tablet  Commonly known as:  COZAAR  Take 25 mg by mouth every morning.     metFORMIN 500 MG 24 hr tablet  Commonly known as:  GLUCOPHAGE-XR  Take 500 mg by mouth at bedtime.     metoprolol succinate 25 MG 24 hr tablet  Commonly known as:  TOPROL-XL  Take 25 mg by mouth every morning.     nabumetone 750 MG tablet  Commonly known as:  RELAFEN  Take 750 mg by mouth 2 (two) times daily.     omeprazole 40 MG capsule  Commonly known as:  PRILOSEC  Take 40 mg by mouth 2 (two) times daily.     ondansetron 4 MG tablet  Commonly known as:  ZOFRAN  Take 1 tablet (4 mg total) by mouth every 8 (eight) hours as needed for nausea or vomiting.     oxyCODONE-acetaminophen 5-325 MG tablet  Commonly known as:  PERCOCET/ROXICET  Take 1-2 tablets by mouth every 4 (four) hours as needed for moderate pain.     pioglitazone 30 MG tablet  Commonly known as:  ACTOS  Take 30 mg by mouth every morning.  traMADol 50 MG tablet  Commonly known as:  ULTRAM  Take 50 mg by mouth every morning.     TRESIBA FLEXTOUCH 200 UNIT/ML Sopn  Generic drug:  Insulin Degludec  Inject 24 Units into the skin at bedtime.     TRULICITY 1.5 0000000 Sopn  Generic drug:  Dulaglutide  Inject 1.5 mg into the skin once a week.     UNIFINE PENTIPS 31G X 5 MM Misc  Generic drug:  Insulin Pen Needle     zolpidem 5 MG tablet  Commonly known as:  AMBIEN  Take 5 mg by mouth at bedtime as needed for sleep.         SignedNicolette Bang 08/28/2015, 8:42 PM

## 2015-08-30 ENCOUNTER — Encounter: Payer: Self-pay | Admitting: Urology

## 2015-08-30 ENCOUNTER — Ambulatory Visit (INDEPENDENT_AMBULATORY_CARE_PROVIDER_SITE_OTHER): Payer: 59 | Admitting: Urology

## 2015-08-30 VITALS — BP 129/86 | HR 106 | Ht 63.0 in | Wt 228.0 lb

## 2015-08-30 DIAGNOSIS — N390 Urinary tract infection, site not specified: Secondary | ICD-10-CM

## 2015-08-30 LAB — URINALYSIS, COMPLETE
Bilirubin, UA: NEGATIVE
Ketones, UA: NEGATIVE
Nitrite, UA: NEGATIVE
PH UA: 6 (ref 5.0–7.5)
Specific Gravity, UA: 1.025 (ref 1.005–1.030)
Urobilinogen, Ur: 0.2 mg/dL (ref 0.2–1.0)

## 2015-08-30 LAB — MICROSCOPIC EXAMINATION
BACTERIA UA: NONE SEEN
RBC, UA: >30 /hpf — AB (ref 0–?)

## 2015-08-31 LAB — CBC WITH DIFFERENTIAL/PLATELET
Basophils Absolute: 0 10*3/uL (ref 0.0–0.2)
Basos: 0 %
EOS (ABSOLUTE): 0.1 10*3/uL (ref 0.0–0.4)
Eos: 1 %
HEMATOCRIT: 37 % (ref 34.0–46.6)
Hemoglobin: 11.9 g/dL (ref 11.1–15.9)
Immature Grans (Abs): 0 10*3/uL (ref 0.0–0.1)
Immature Granulocytes: 0 %
LYMPHS ABS: 1.9 10*3/uL (ref 0.7–3.1)
Lymphs: 18 %
MCH: 30.1 pg (ref 26.6–33.0)
MCHC: 32.2 g/dL (ref 31.5–35.7)
MCV: 94 fL (ref 79–97)
MONOCYTES: 6 %
MONOS ABS: 0.7 10*3/uL (ref 0.1–0.9)
Neutrophils Absolute: 7.9 10*3/uL — ABNORMAL HIGH (ref 1.4–7.0)
Neutrophils: 75 %
PLATELETS: 408 10*3/uL — AB (ref 150–379)
RBC: 3.95 x10E6/uL (ref 3.77–5.28)
RDW: 13.7 % (ref 12.3–15.4)
WBC: 10.7 10*3/uL (ref 3.4–10.8)

## 2015-09-06 ENCOUNTER — Encounter: Payer: Self-pay | Admitting: Urology

## 2015-09-06 ENCOUNTER — Ambulatory Visit (INDEPENDENT_AMBULATORY_CARE_PROVIDER_SITE_OTHER): Payer: 59 | Admitting: Urology

## 2015-09-06 VITALS — BP 125/86 | HR 92 | Ht 63.0 in | Wt 227.1 lb

## 2015-09-06 DIAGNOSIS — N390 Urinary tract infection, site not specified: Secondary | ICD-10-CM

## 2015-09-06 DIAGNOSIS — N200 Calculus of kidney: Secondary | ICD-10-CM

## 2015-09-06 DIAGNOSIS — R3129 Other microscopic hematuria: Secondary | ICD-10-CM

## 2015-09-06 LAB — MICROSCOPIC EXAMINATION: RBC, UA: >30 /hpf — AB (ref 0–?)

## 2015-09-06 LAB — URINALYSIS, COMPLETE
Bilirubin, UA: NEGATIVE
Nitrite, UA: POSITIVE — AB
Specific Gravity, UA: 1.025 (ref 1.005–1.030)
Urobilinogen, Ur: 1 mg/dL (ref 0.2–1.0)
pH, UA: 5.5 (ref 5.0–7.5)

## 2015-09-06 MED ORDER — CIPROFLOXACIN HCL 500 MG PO TABS
500.0000 mg | ORAL_TABLET | Freq: Once | ORAL | Status: AC
Start: 2015-09-06 — End: 2015-09-06
  Administered 2015-09-06: 500 mg via ORAL

## 2015-09-06 MED ORDER — LIDOCAINE HCL 2 % EX GEL
1.0000 "application " | Freq: Once | CUTANEOUS | Status: AC
  Administered 2015-09-06: 1 via URETHRAL

## 2015-09-06 NOTE — Progress Notes (Signed)
09/06/2015 9:37 AM   [name] [birth year] [id number]  Referring provider: Adin Hector, MD [facility] [place], [place] [address]  Chief Complaint  Patient presents with  . Cysto Stent Removal    HPI:  1 - Nephrolithiasis -  08/2015 - Rt PCNL for 2.5 cm stone by [facility]  2 - Metabolic Stone Disease -  Eval 2017 - BMP normal; composition - pending; 24 Hr Urines - pending  3 - Recurrent UTI - h/o recurrent UTI pre-2017 prior to PCNL for stones. Non-diabetic.   PMH sig for [id number]  Today "[name]" is seen for cysto stent pull following her recent right percutaneous nephrostolithotomy for large renal stone.    PMH: Past Medical History  Diagnosis Date  . Heartburn   . Arthritis   . Diabetes ([place])   . HLD (hyperlipidemia)   . Cervical spinal stenosis   . Lumbar stenosis   . History of kidney stones   . Asthma without status asthmaticus 08/02/2015    Overview:  mild, not requiring treatment   . Degeneration of intervertebral disc of cervical region 04/28/2015  . Kidney stones 07/18/2015  . Sleep apnea     does not wear cpap   . GERD (gastroesophageal reflux disease)   . Headache, migraine 08/02/2015    Surgical History: Past Surgical History  Procedure Laterality Date  . Cervical fusion  2009  . Carpal tunnel release  1996  . Tubal ligation    . Nephrolithotomy Right 08/24/2015    Procedure: NEPHROLITHOTOMY PERCUTANEOUS WITH SURGEON TO GET ACCESS;  Surgeon: Cleon Gustin, MD;  Location: [facility];  Service: Urology;  Laterality: Right;  . Cystoscopy with stent placement Right 08/24/2015    Procedure: CYSTOSCOPY WITH STENT PLACEMENT;  Surgeon: Cleon Gustin, MD;  Location: [facility];  Service: Urology;  Laterality: Right;    Home Medications:    Medication List       This list is accurate as of: 09/06/15  9:37 AM.  Always use your most recent med list.               acetaminophen 500 MG tablet  Commonly known as:   TYLENOL  Take 1,000 mg by mouth 3 (three) times daily as needed (Pain).     atorvastatin 20 MG tablet  Commonly known as:  LIPITOR  Take 20 mg by mouth at bedtime.     DULoxetine 60 MG capsule  Commonly known as:  CYMBALTA  Take 60 mg by mouth at bedtime.     gabapentin 300 MG capsule  Commonly known as:  NEURONTIN  Take 300-600 mg by mouth 2 (two) times daily. 300 mg in the morning and 600 mg at night     glimepiride 4 MG tablet  Commonly known as:  AMARYL  Take 4 mg by mouth 2 (two) times daily.     HYDROcodone-acetaminophen 5-325 MG tablet  Commonly known as:  NORCO/VICODIN  Take 1 tablet by mouth daily. Reported on 08/30/2015     losartan 25 MG tablet  Commonly known as:  COZAAR  Take 25 mg by mouth every morning.     metFORMIN 500 MG 24 hr tablet  Commonly known as:  GLUCOPHAGE-XR  Take 500 mg by mouth at bedtime.     metoprolol succinate 25 MG 24 hr tablet  Commonly known as:  TOPROL-XL  Take 25 mg by mouth every morning.     nabumetone 750 MG tablet  Commonly known  as:  RELAFEN  Take 750 mg by mouth 2 (two) times daily.     omeprazole 40 MG capsule  Commonly known as:  PRILOSEC  Take 40 mg by mouth 2 (two) times daily.     ondansetron 4 MG tablet  Commonly known as:  ZOFRAN  Take 1 tablet (4 mg total) by mouth every 8 (eight) hours as needed for nausea or vomiting.     oxyCODONE-acetaminophen 5-325 MG tablet  Commonly known as:  PERCOCET/ROXICET  Take 1-2 tablets by mouth every 4 (four) hours as needed for moderate pain.     pioglitazone 30 MG tablet  Commonly known as:  ACTOS  Take 30 mg by mouth every morning.     traMADol 50 MG tablet  Commonly known as:  ULTRAM  Take 50 mg by mouth every morning.     TRESIBA FLEXTOUCH 200 UNIT/ML Sopn  Generic drug:  Insulin Degludec  Inject 24 Units into the skin at bedtime.     TRULICITY 1.5 0000000 Sopn  Generic drug:  Dulaglutide  Inject 1.5 mg into the skin once a week.     UNIFINE PENTIPS 31G X 5 MM  Misc  Generic drug:  Insulin Pen Needle     zolpidem 5 MG tablet  Commonly known as:  AMBIEN  Take 5 mg by mouth at bedtime as needed for sleep. Reported on 08/30/2015        Allergies:  Allergies  Allergen Reactions  . Biaxin [Clarithromycin] Nausea And Vomiting and Nausea Only    Family History: Family History  Problem Relation Age of Onset  . Kidney disease Neg Hx   . Bladder Cancer Neg Hx   . Heart failure Mother   . Heart attack Father     Social History:  reports that she has never smoked. She has never used smokeless tobacco. She reports that she drinks alcohol. She reports that she does not use illicit drugs.  ROS:    Urological Symptom Review  Patient is experiencing the following symptoms: Frequent urination   Review of Systems  Gastrointestinal (upper)  : Negative for upper GI symptoms  Gastrointestinal (lower) : Negative for lower GI symptoms  Constitutional : Negative for symptoms  Skin: Negative for skin symptoms  Eyes: Negative for eye symptoms  Ear/Nose/Throat : Negative for Ear/Nose/Throat symptoms  Hematologic/Lymphatic: Negative for Hematologic/Lymphatic symptoms  Cardiovascular : Negative for cardiovascular symptoms  Respiratory : Negative for respiratory symptoms  Endocrine: Negative for endocrine symptoms  Musculoskeletal: Negative for musculoskeletal symptoms  Neurological: Negative for neurological symptoms  Psychologic: Negative for psychiatric symptoms     Physical Exam: There were no vitals taken for this visit.  Constitutional:  Alert and oriented, No acute distress. HEENT: [place] AT, moist mucus membranes.  Trachea midline, no masses. Cardiovascular: No clubbing, cyanosis, or edema. Respiratory: Normal respiratory effort, no increased work of breathing. GI: Abdomen is soft, nontender, nondistended, no abdominal masses GU: No CVA tenderness. No high-grade prolapse. Rt PCNL site c/d/i.  Skin: No rashes,  bruises or suspicious lesions. Lymph: No cervical or inguinal adenopathy. Neurologic: Grossly intact, no focal deficits, moving all 4 extremities. Psychiatric: Normal mood and affect.  Laboratory Data: Lab Results  Component Value Date   WBC 10.7 08/30/2015   HGB 10.9* 08/25/2015   HCT 37.0 08/30/2015   MCV 94 08/30/2015   PLT 408* 08/30/2015    Lab Results  Component Value Date   CREATININE 0.70 08/25/2015    No results found for: PSA  No  results found for: TESTOSTERONE  Lab Results  Component Value Date   HGBA1C 8.0* 08/18/2015    Urinalysis    Component Value Date/Time   APPEARANCEUR Cloudy* 08/30/2015 0852   GLUCOSEU Trace* 08/30/2015 0852   BILIRUBINUR Negative 08/30/2015 0852   PROTEINUR 3+* 08/30/2015 0852   NITRITE Negative 08/30/2015 0852   LEUKOCYTESUR 1+* 08/30/2015 0852       Cystoscopy Procedure Note  Patient identification was confirmed, informed consent was obtained, and patient was prepped using Betadine solution.  Lidocaine jelly was administered per urethral meatus.    Preoperative abx where received prior to procedure.    Procedure: - Flexible cystoscope introduced, without any difficulty.   - Thorough search of the bladder revealed:    normal urethral meatus    normal urothelium    no stones    no ulcers     no tumors    no urethral polyps    no trabeculation  - Ureteral orifices were normal in position and appearance. - Rt ureteral stent grasped with cold graspers and removed. Inspected and intact, discarded.   Post-Procedure: - Patient tolerated the procedure well   Assessment & Plan:    1 - Nephrolithiasis - now stone free, rec annual surveillance for now as high-risk stone former, consider "pre-emptive" treatment with goal of avoiding future PCNL.   2 - Metabolic Stone Disease - Discussed goals of eval in high-risk stone formers which she certainly is. Serum studies normal. Composition still pending. RX for 24 hr urines  stuides given today.  3 - Recurrent UTI - hopefully this will improve now s/p removal of large rt renal stone / potential resevoir / nidus. Consider twice weekly estrace if remains problematic.  4 - RTC 6 weeks with 24 hr urines to discuss metabolic management of urolithiasis.     No Follow-up on file.  Alexis Frock, [facility] [address], [place], [place] [address] [phone]

## 2015-09-06 NOTE — Progress Notes (Signed)
08/30/2015 1:20 PM   [name] November 15, 1960 [id number]  Referring provider: Adin Hector, MD [facility] [place], [place] [address]  Chief Complaint  Patient presents with  . Routine Post Op    tube removal    HPI: [name] is a 55yo here for nephrostomy tube removal after PCNL. She denies any significant pain. She does feel dizzy and lightheaded. Hemoglobin normal   PMH: Past Medical History  Diagnosis Date  . Heartburn   . Arthritis   . Diabetes ([place])   . HLD (hyperlipidemia)   . Cervical spinal stenosis   . Lumbar stenosis   . History of kidney stones   . Asthma without status asthmaticus 08/02/2015    Overview:  mild, not requiring treatment   . Degeneration of intervertebral disc of cervical region 04/28/2015  . Kidney stones 07/18/2015  . Sleep apnea     does not wear cpap   . GERD (gastroesophageal reflux disease)   . Headache, migraine 08/02/2015    Surgical History: Past Surgical History  Procedure Laterality Date  . Cervical fusion  2009  . Carpal tunnel release  1996  . Tubal ligation    . Nephrolithotomy Right 08/24/2015    Procedure: NEPHROLITHOTOMY PERCUTANEOUS WITH SURGEON TO GET ACCESS;  Surgeon: Cleon Gustin, MD;  Location: [facility];  Service: Urology;  Laterality: Right;  . Cystoscopy with stent placement Right 08/24/2015    Procedure: CYSTOSCOPY WITH STENT PLACEMENT;  Surgeon: Cleon Gustin, MD;  Location: [facility];  Service: Urology;  Laterality: Right;    Home Medications:    Medication List       This list is accurate as of: 08/30/15 11:59 PM.  Always use your most recent med list.               acetaminophen 500 MG tablet  Commonly known as:  TYLENOL  Take 1,000 mg by mouth 3 (three) times daily as needed (Pain).     atorvastatin 20 MG tablet  Commonly known as:  LIPITOR  Take 20 mg by mouth at bedtime.     DULoxetine 60 MG capsule  Commonly known as:  CYMBALTA  Take 60 mg by mouth at  bedtime.     gabapentin 300 MG capsule  Commonly known as:  NEURONTIN  Take 300-600 mg by mouth 2 (two) times daily. 300 mg in the morning and 600 mg at night     glimepiride 4 MG tablet  Commonly known as:  AMARYL  Take 4 mg by mouth 2 (two) times daily.     HYDROcodone-acetaminophen 5-325 MG tablet  Commonly known as:  NORCO/VICODIN  Take 1 tablet by mouth daily. Reported on 09/06/2015     losartan 25 MG tablet  Commonly known as:  COZAAR  Take 25 mg by mouth every morning.     metFORMIN 500 MG 24 hr tablet  Commonly known as:  GLUCOPHAGE-XR  Take 500 mg by mouth at bedtime.     metoprolol succinate 25 MG 24 hr tablet  Commonly known as:  TOPROL-XL  Take 25 mg by mouth every morning.     nabumetone 750 MG tablet  Commonly known as:  RELAFEN  Take 750 mg by mouth 2 (two) times daily.     omeprazole 40 MG capsule  Commonly known as:  PRILOSEC  Take 40 mg by mouth 2 (two) times daily.     ondansetron 4 MG tablet  Commonly known as:  ZOFRAN  Take 1 tablet (4 mg total) by mouth every 8 (eight) hours as needed for nausea or vomiting.     oxyCODONE-acetaminophen 5-325 MG tablet  Commonly known as:  PERCOCET/ROXICET  Take 1-2 tablets by mouth every 4 (four) hours as needed for moderate pain.     pioglitazone 30 MG tablet  Commonly known as:  ACTOS  Take 30 mg by mouth every morning.     traMADol 50 MG tablet  Commonly known as:  ULTRAM  Take 50 mg by mouth every morning.     TRESIBA FLEXTOUCH 200 UNIT/ML Sopn  Generic drug:  Insulin Degludec  Inject 24 Units into the skin at bedtime.     TRULICITY 1.5 0000000 Sopn  Generic drug:  Dulaglutide  Inject 1.5 mg into the skin once a week.     UNIFINE PENTIPS 31G X 5 MM Misc  Generic drug:  Insulin Pen Needle     zolpidem 5 MG tablet  Commonly known as:  AMBIEN  Take 5 mg by mouth at bedtime as needed for sleep. Reported on 08/30/2015        Allergies:  Allergies  Allergen Reactions  . Biaxin [Clarithromycin]  Nausea And Vomiting and Nausea Only    Family History: Family History  Problem Relation Age of Onset  . Kidney disease Neg Hx   . Bladder Cancer Neg Hx   . Heart failure Mother   . Heart attack Father     Social History:  reports that she has never smoked. She has never used smokeless tobacco. She reports that she drinks alcohol. She reports that she does not use illicit drugs.  ROS: UROLOGY Frequent Urination?: Yes Hard to postpone urination?: Yes Burning/pain with urination?: No Get up at night to urinate?: Yes Leakage of urine?: No Urine stream starts and stops?: No Trouble starting stream?: No Do you have to strain to urinate?: No Blood in urine?: Yes Urinary tract infection?: No Sexually transmitted disease?: No Injury to kidneys or bladder?: No Painful intercourse?: No Weak stream?: No Currently pregnant?: No Vaginal bleeding?: No Last menstrual period?: n  Gastrointestinal Nausea?: Yes Vomiting?: No Indigestion/heartburn?: No Diarrhea?: Yes Constipation?: No  Constitutional Fever: No Night sweats?: No Weight loss?: No Fatigue?: Yes  Skin Skin rash/lesions?: No Itching?: No  Eyes Blurred vision?: No Double vision?: No  Ears/Nose/Throat Sore throat?: No Sinus problems?: No  Hematologic/Lymphatic Swollen glands?: No Easy bruising?: No  Cardiovascular Leg swelling?: No Chest pain?: No  Respiratory Cough?: No Shortness of breath?: No  Endocrine Excessive thirst?: No  Musculoskeletal Back pain?: No Joint pain?: No  Neurological Headaches?: No Dizziness?: Yes  Psychologic Depression?: No Anxiety?: No  Physical Exam: BP 129/86 mmHg  Pulse 106  Ht 5\' 3"  (1.6 m)  Wt 103.42 kg (228 lb)  BMI 40.40 kg/m2  Constitutional:  Alert and oriented, No acute distress. HEENT: [place] AT, moist mucus membranes.  Trachea midline, no masses. Cardiovascular: No clubbing, cyanosis, or edema. Respiratory: Normal respiratory effort, no increased  work of breathing. GI: Abdomen is soft, nontender, nondistended, no abdominal masses GU: No CVA tenderness.  Skin: No rashes, bruises or suspicious lesions. Lymph: No cervical or inguinal adenopathy. Neurologic: Grossly intact, no focal deficits, moving all 4 extremities. Psychiatric: Normal mood and affect.  Laboratory Data: Lab Results  Component Value Date   WBC 10.7 08/30/2015   HGB 10.9* 08/25/2015   HCT 37.0 08/30/2015   MCV 94 08/30/2015   PLT 408* 08/30/2015    Lab Results  Component Value Date   CREATININE 0.70 08/25/2015    No results found for: PSA  No results found for: TESTOSTERONE  Lab Results  Component Value Date   HGBA1C 8.0* 08/18/2015    Urinalysis    Component Value Date/Time   APPEARANCEUR Cloudy* 09/06/2015 0937   GLUCOSEU 1+* 09/06/2015 0937   BILIRUBINUR Negative 09/06/2015 0937   PROTEINUR 3+* 09/06/2015 0937   NITRITE Positive* 09/06/2015 0937   LEUKOCYTESUR Trace* 09/06/2015 0937    Pertinent Imaging: none  Assessment & Plan:    1. Nephrolithiasis -RTC 1 week for stent removal - Urinalysis, Complete - CBC with Differential/Platelet   No Follow-up on file.  Nicolette Bang, MD  [facility] [address] [place], [place] [address] [phone]

## 2015-09-14 MED FILL — TRULICITY 1.5 MG/0.5 ML PEN: 1.5 | 84 days supply | Qty: 6 | Fill #2

## 2015-10-18 ENCOUNTER — Other Ambulatory Visit: Payer: Self-pay | Admitting: Internal Medicine

## 2015-10-18 DIAGNOSIS — E1165 Type 2 diabetes mellitus with hyperglycemia: Secondary | ICD-10-CM | POA: Diagnosis not present

## 2015-10-18 DIAGNOSIS — R221 Localized swelling, mass and lump, neck: Secondary | ICD-10-CM | POA: Insufficient documentation

## 2015-10-18 DIAGNOSIS — J4520 Mild intermittent asthma, uncomplicated: Secondary | ICD-10-CM | POA: Diagnosis not present

## 2015-10-18 MED FILL — AMOX TR-K CLV 875-125 MG TA: 875-125 | 10 days supply | Qty: 20 | Fill #0

## 2015-10-18 MED FILL — traMADol HCL 50 MG TABS: 50 | 30 days supply | Qty: 60 | Fill #3

## 2015-10-19 ENCOUNTER — Ambulatory Visit: Admission: RE | Admit: 2015-10-19 | Payer: 59 | Source: Ambulatory Visit

## 2015-10-24 ENCOUNTER — Ambulatory Visit: Admission: RE | Admit: 2015-10-24 | Payer: 59 | Source: Ambulatory Visit

## 2015-10-26 MED FILL — FLUCONAZOLE 150 MG TABLET: 150 | 1 days supply | Qty: 1 | Fill #0

## 2015-10-28 DIAGNOSIS — E1129 Type 2 diabetes mellitus with other diabetic kidney complication: Secondary | ICD-10-CM | POA: Diagnosis not present

## 2015-10-28 DIAGNOSIS — D472 Monoclonal gammopathy: Secondary | ICD-10-CM | POA: Diagnosis not present

## 2015-10-28 DIAGNOSIS — R809 Proteinuria, unspecified: Secondary | ICD-10-CM | POA: Diagnosis not present

## 2015-11-02 MED FILL — FLUCONAZOLE 150 MG TABLET: 150 | 1 days supply | Qty: 1 | Fill #1

## 2015-11-02 MED FILL — TRESIBA FLEXTOUCH 200 UNITS: 200 | 75 days supply | Qty: 9 | Fill #1

## 2015-11-04 DIAGNOSIS — R800 Isolated proteinuria: Secondary | ICD-10-CM | POA: Insufficient documentation

## 2015-11-04 DIAGNOSIS — E1129 Type 2 diabetes mellitus with other diabetic kidney complication: Secondary | ICD-10-CM | POA: Diagnosis not present

## 2015-11-04 DIAGNOSIS — E7800 Pure hypercholesterolemia, unspecified: Secondary | ICD-10-CM | POA: Diagnosis not present

## 2015-11-04 DIAGNOSIS — M5030 Other cervical disc degeneration, unspecified cervical region: Secondary | ICD-10-CM | POA: Diagnosis not present

## 2015-11-07 MED FILL — FLUCONAZOLE 150 MG TABLET: 150 | 1 days supply | Qty: 1 | Fill #0 | Status: TO

## 2015-11-23 MED FILL — DULoxetine HCL 60 MG CPEP: 60 | 90 days supply | Qty: 90 | Fill #2

## 2015-11-23 MED FILL — traMADol HCL 50 MG TABS: 50 | 30 days supply | Qty: 60 | Fill #4

## 2015-11-23 MED FILL — METFORMIN HCL ER 500 MG TAB: 500 | 90 days supply | Qty: 90 | Fill #2

## 2015-11-23 MED FILL — ATORVASTATIN 40 MG TABLET: 40 | 90 days supply | Qty: 90 | Fill #0

## 2015-11-23 MED FILL — LOSARTAN POTASSIUM 25 MG TA: 25 | 90 days supply | Qty: 90 | Fill #2

## 2015-11-23 MED FILL — METOPROLOL SUCC ER 25 MG TA: 25 | 90 days supply | Qty: 90 | Fill #2

## 2015-11-23 MED FILL — GLIMEPIRIDE 4 MG TABLET: 4 | 90 days supply | Qty: 180 | Fill #2

## 2015-11-23 MED FILL — NABUMETONE 750 MG TABLET: 750 | 90 days supply | Qty: 180 | Fill #2

## 2015-11-23 MED FILL — OMEPRAZOLE DR 40 MG CAPSULE: 40 | 45 days supply | Qty: 90 | Fill #2

## 2015-11-23 MED FILL — GABAPENTIN 300 MG CAPSULE: 300 | 90 days supply | Qty: 270 | Fill #1

## 2015-11-25 MED FILL — PIOGLITAZONE HCL 30 MG TAB: 30 | 90 days supply | Qty: 90 | Fill #0

## 2015-11-28 MED FILL — UNIFINE PENTIPS 31GX3/16: 31G X 5 MM | 90 days supply | Qty: 100 | Fill #2

## 2015-11-30 DIAGNOSIS — Z794 Long term (current) use of insulin: Secondary | ICD-10-CM | POA: Diagnosis not present

## 2015-11-30 DIAGNOSIS — R809 Proteinuria, unspecified: Secondary | ICD-10-CM | POA: Diagnosis not present

## 2015-11-30 DIAGNOSIS — E1129 Type 2 diabetes mellitus with other diabetic kidney complication: Secondary | ICD-10-CM | POA: Diagnosis not present

## 2015-12-06 DIAGNOSIS — E1129 Type 2 diabetes mellitus with other diabetic kidney complication: Secondary | ICD-10-CM | POA: Diagnosis not present

## 2015-12-06 DIAGNOSIS — E1165 Type 2 diabetes mellitus with hyperglycemia: Secondary | ICD-10-CM | POA: Diagnosis not present

## 2015-12-06 DIAGNOSIS — R801 Persistent proteinuria, unspecified: Secondary | ICD-10-CM | POA: Diagnosis not present

## 2015-12-19 DIAGNOSIS — M4802 Spinal stenosis, cervical region: Secondary | ICD-10-CM | POA: Diagnosis not present

## 2015-12-19 DIAGNOSIS — M5416 Radiculopathy, lumbar region: Secondary | ICD-10-CM | POA: Diagnosis not present

## 2015-12-19 DIAGNOSIS — M5030 Other cervical disc degeneration, unspecified cervical region: Secondary | ICD-10-CM | POA: Diagnosis not present

## 2015-12-19 DIAGNOSIS — M4806 Spinal stenosis, lumbar region: Secondary | ICD-10-CM | POA: Diagnosis not present

## 2016-01-20 MED FILL — MELOXICAM 15 MG TABLET: 15 | 30 days supply | Qty: 30 | Fill #0

## 2016-01-26 MED FILL — traMADol HCL 50 MG TABS: 50 | 30 days supply | Qty: 60 | Fill #0

## 2016-01-30 MED FILL — SOLIQUA 100 UNIT-33 MCG/ML: 100-33 | 50 days supply | Qty: 15 | Fill #0

## 2016-01-30 MED FILL — UNIFINE PENTIPS 31GX3/16": 31G X 5 MM | 90 days supply | Qty: 100 | Fill #3

## 2016-01-30 MED FILL — UNIFINE PENTIPS 31GX3/16: 31G X 5 MM | 90 days supply | Qty: 100 | Fill #3

## 2016-02-08 DIAGNOSIS — E1129 Type 2 diabetes mellitus with other diabetic kidney complication: Secondary | ICD-10-CM | POA: Diagnosis not present

## 2016-02-08 DIAGNOSIS — Z794 Long term (current) use of insulin: Secondary | ICD-10-CM | POA: Diagnosis not present

## 2016-02-08 DIAGNOSIS — E1165 Type 2 diabetes mellitus with hyperglycemia: Secondary | ICD-10-CM | POA: Diagnosis not present

## 2016-02-20 MED FILL — ATORVASTATIN 40 MG TABLET: 40 | 90 days supply | Qty: 90 | Fill #1

## 2016-02-20 MED FILL — DULoxetine HCL 60 MG CPEP: 60 | 90 days supply | Qty: 90 | Fill #3

## 2016-02-20 MED FILL — MELOXICAM 15 MG TABLET: 15 | 90 days supply | Qty: 90 | Fill #0

## 2016-02-20 MED FILL — LOSARTAN POTASSIUM 25 MG TA: 25 | 90 days supply | Qty: 90 | Fill #3

## 2016-02-20 MED FILL — METOPROLOL SUCC ER 25 MG TA: 25 | 90 days supply | Qty: 90 | Fill #3

## 2016-02-20 MED FILL — PIOGLITAZONE HCL 30 MG TAB: 30 | 90 days supply | Qty: 90 | Fill #1

## 2016-02-20 MED FILL — GLIMEPIRIDE 4 MG TABLET: 4 | 90 days supply | Qty: 180 | Fill #3

## 2016-02-20 MED FILL — METFORMIN HCL ER 500 MG TAB: 500 | 90 days supply | Qty: 90 | Fill #3

## 2016-02-22 MED FILL — OMEPRAZOLE DR 40 MG CAPSULE: 40 | 90 days supply | Qty: 180 | Fill #0

## 2016-02-23 MED FILL — traMADol HCL 50 MG TABS: 50 | 30 days supply | Qty: 60 | Fill #1

## 2016-02-28 MED FILL — SOLIQUA 100 UNIT-33 MCG/ML: 100-33 | 26 days supply | Qty: 15 | Fill #0

## 2016-03-05 DIAGNOSIS — M542 Cervicalgia: Secondary | ICD-10-CM | POA: Diagnosis not present

## 2016-03-05 DIAGNOSIS — M549 Dorsalgia, unspecified: Secondary | ICD-10-CM | POA: Diagnosis not present

## 2016-03-05 DIAGNOSIS — M5416 Radiculopathy, lumbar region: Secondary | ICD-10-CM | POA: Diagnosis not present

## 2016-03-13 ENCOUNTER — Other Ambulatory Visit: Payer: Self-pay | Admitting: Neurosurgery

## 2016-03-13 DIAGNOSIS — M542 Cervicalgia: Secondary | ICD-10-CM

## 2016-03-13 DIAGNOSIS — M5416 Radiculopathy, lumbar region: Secondary | ICD-10-CM

## 2016-03-19 MED FILL — NOVOLOG FLEXPEN SYRINGE: 100 | 20 days supply | Qty: 15 | Fill #0

## 2016-03-19 MED FILL — SOLIQUA 100 UNIT-33 MCG/ML: 100-33 | 25 days supply | Qty: 15 | Fill #0

## 2016-03-22 MED FILL — HYDROCODON-APAP 5-325: 5-325 | 30 days supply | Qty: 60 | Fill #0

## 2016-03-29 ENCOUNTER — Ambulatory Visit
Admission: RE | Admit: 2016-03-29 | Discharge: 2016-03-29 | Disposition: A | Discharge status: Home / Self Care | Payer: 59 | Source: Ambulatory Visit | Attending: Neurosurgery | Admitting: Neurosurgery

## 2016-03-29 VITALS — BP 133/68 | HR 71

## 2016-03-29 DIAGNOSIS — M48061 Spinal stenosis, lumbar region without neurogenic claudication: Secondary | ICD-10-CM | POA: Diagnosis not present

## 2016-03-29 DIAGNOSIS — M5416 Radiculopathy, lumbar region: Secondary | ICD-10-CM

## 2016-03-29 DIAGNOSIS — M5412 Radiculopathy, cervical region: Secondary | ICD-10-CM

## 2016-03-29 DIAGNOSIS — M542 Cervicalgia: Secondary | ICD-10-CM

## 2016-03-29 DIAGNOSIS — M5116 Intervertebral disc disorders with radiculopathy, lumbar region: Secondary | ICD-10-CM

## 2016-03-29 DIAGNOSIS — M4802 Spinal stenosis, cervical region: Secondary | ICD-10-CM | POA: Diagnosis not present

## 2016-03-29 DIAGNOSIS — M5136 Other intervertebral disc degeneration, lumbar region: Secondary | ICD-10-CM

## 2016-03-29 DIAGNOSIS — M5030 Other cervical disc degeneration, unspecified cervical region: Secondary | ICD-10-CM

## 2016-03-29 MED ORDER — MEPERIDINE HCL 100 MG/ML IJ SOLN
75.0000 mg | Freq: Once | INTRAMUSCULAR | Status: AC
  Administered 2016-03-29: 75 mg via INTRAMUSCULAR

## 2016-03-29 MED ORDER — ONDANSETRON HCL 4 MG/2ML IJ SOLN
4.0000 mg | Freq: Once | INTRAMUSCULAR | Status: AC
  Administered 2016-03-29: 4 mg via INTRAMUSCULAR

## 2016-03-29 MED ORDER — DIAZEPAM 5 MG PO TABS
10.0000 mg | ORAL_TABLET | Freq: Once | ORAL | Status: AC
  Administered 2016-03-29: 10 mg via ORAL

## 2016-03-29 MED ORDER — IOPAMIDOL (ISOVUE-M 300) INJECTION 61%
10.0000 mL | Freq: Once | INTRAMUSCULAR | Status: AC | PRN
  Administered 2016-03-29: 10 mL via INTRATHECAL

## 2016-03-29 NOTE — Discharge Instructions (Signed)
Myelogram Discharge Instructions  1. Go home and rest quietly for the next 24 hours.  It is important to lie flat for the next 24 hours.  Get up only to go to the restroom.  You may lie in the bed or on a couch on your back, your stomach, your left side or your right side.  You may have one pillow under your head.  You may have pillows between your knees while you are on your side or under your knees while you are on your back.  2. DO NOT drive today.  Recline the seat as far back as it will go, while still wearing your seat belt, on the way home.  3. You may get up to go to the bathroom as needed.  You may sit up for 10 minutes to eat.  You may resume your normal diet and medications unless otherwise indicated.  Drink lots of extra fluids today and tomorrow.  4. The incidence of headache, nausea, or vomiting is about 5% (one in 20 patients).  If you develop a headache, lie flat and drink plenty of fluids until the headache goes away.  Caffeinated beverages may be helpful.  If you develop severe nausea and vomiting or a headache that does not go away with flat bed rest, call [phone].  5. You may resume normal activities after your 24 hours of bed rest is over; however, do not exert yourself strongly or do any heavy lifting tomorrow. If when you get up you have a headache when standing, go back to bed and force fluids for another 24 hours.  6. Call your physician for a follow-up appointment.  The results of your myelogram will be sent directly to your physician by the following day.  7. If you have any questions or if complications develop after you arrive home, please call [phone].  Discharge instructions have been explained to the patient.  The patient, or the person responsible for the patient, fully understands these instructions.       May resume Tramadol and Cymbalta on Nov. 11, 2017, after 10:30 am.

## 2016-03-29 NOTE — Progress Notes (Signed)
Pt states she has been off Cymbalta and Tramadol for the past 2 days.

## 2016-04-02 ENCOUNTER — Telehealth: Payer: Self-pay

## 2016-04-02 NOTE — Telephone Encounter (Signed)
Spoke with patient's husband after patient had a myelogram here 03/29/16.  He says she was in pain for three-four days after the procedure but that she is better now.  jkl

## 2016-04-03 MED FILL — traMADol HCL 50 MG TABS: 50 | 30 days supply | Qty: 60 | Fill #2

## 2016-04-06 DIAGNOSIS — M5412 Radiculopathy, cervical region: Secondary | ICD-10-CM | POA: Diagnosis not present

## 2016-04-09 ENCOUNTER — Other Ambulatory Visit: Payer: Self-pay | Admitting: Neurosurgery

## 2016-04-11 DIAGNOSIS — Z794 Long term (current) use of insulin: Secondary | ICD-10-CM | POA: Diagnosis not present

## 2016-04-11 DIAGNOSIS — E1165 Type 2 diabetes mellitus with hyperglycemia: Secondary | ICD-10-CM | POA: Diagnosis not present

## 2016-04-16 DIAGNOSIS — Z794 Long term (current) use of insulin: Secondary | ICD-10-CM | POA: Diagnosis not present

## 2016-04-16 DIAGNOSIS — E1129 Type 2 diabetes mellitus with other diabetic kidney complication: Secondary | ICD-10-CM | POA: Diagnosis not present

## 2016-04-16 DIAGNOSIS — E1165 Type 2 diabetes mellitus with hyperglycemia: Secondary | ICD-10-CM | POA: Diagnosis not present

## 2016-04-17 DIAGNOSIS — H5213 Myopia, bilateral: Secondary | ICD-10-CM | POA: Diagnosis not present

## 2016-04-19 ENCOUNTER — Encounter (HOSPITAL_COMMUNITY): Payer: Self-pay | Admitting: *Deleted

## 2016-04-19 MED FILL — NOVOLOG FLEXPEN SYRINGE: 100 | 20 days supply | Qty: 15 | Fill #1

## 2016-04-19 MED FILL — SOLIQUA 100 UNIT-33 MCG/ML: 100-33 | 25 days supply | Qty: 15 | Fill #1

## 2016-04-19 NOTE — Progress Notes (Signed)
Pt denies SOB, chest pain, and being under the care of a cardiologist. Pt denies having a stress test, echo and cardiac cath. Pt denies having a chest x ray within the last year. Pt stated that her fasting blood glucose is usually in the 130's. Spoke with Sonia Baller, Diabetes Coordinator, regarding pt instructions for Insulin Glargine-Lixisenatide; Sonia Baller advised that pt contact her Endocrinologist, Dr. Gabriel Carina for pre-op instructions. Requested A1c result from endocrinologist. Pt made aware of diabetes protocol to not take any Actos or other diabetes pills on morning of surgery and no Novolog insulin. Pt made aware of diabetes protocol to check BG every 2 hours prior to arrival, interventions for a BG <70 and >220.  Pt made aware to stop taking Aspirin, vitamins, fish oil, Sudafed and herbal medications. Do not take any NSAIDs ie: Ibuprofen, Advil, Naproxen, BC and Goody Powder or any medication containing Aspirin such as Relafen.   Pt verbalized understanding of all pre-op instructions.

## 2016-04-20 ENCOUNTER — Inpatient Hospital Stay (HOSPITAL_COMMUNITY)
Admission: AD | Admit: 2016-04-20 | Discharge: 2016-04-21 | DRG: 472 | Disposition: A | Discharge status: Home / Self Care | Payer: 59 | Source: Ambulatory Visit | Attending: Neurosurgery | Admitting: Neurosurgery

## 2016-04-20 ENCOUNTER — Ambulatory Visit (HOSPITAL_COMMUNITY): Payer: 59 | Admitting: Certified Registered Nurse Anesthetist

## 2016-04-20 ENCOUNTER — Ambulatory Visit (HOSPITAL_COMMUNITY): Payer: 59

## 2016-04-20 ENCOUNTER — Encounter (HOSPITAL_COMMUNITY)
Admission: AD | Disposition: A | Discharge status: Home / Self Care | Payer: Self-pay | Source: Ambulatory Visit | Attending: Neurosurgery

## 2016-04-20 ENCOUNTER — Encounter (HOSPITAL_COMMUNITY): Payer: Self-pay | Admitting: Surgery

## 2016-04-20 DIAGNOSIS — M4802 Spinal stenosis, cervical region: Principal | ICD-10-CM | POA: Diagnosis present

## 2016-04-20 DIAGNOSIS — Z419 Encounter for procedure for purposes other than remedying health state, unspecified: Secondary | ICD-10-CM

## 2016-04-20 DIAGNOSIS — M4722 Other spondylosis with radiculopathy, cervical region: Secondary | ICD-10-CM | POA: Diagnosis present

## 2016-04-20 DIAGNOSIS — Z6841 Body Mass Index (BMI) 40.0 and over, adult: Secondary | ICD-10-CM

## 2016-04-20 DIAGNOSIS — Z8249 Family history of ischemic heart disease and other diseases of the circulatory system: Secondary | ICD-10-CM

## 2016-04-20 DIAGNOSIS — I2510 Atherosclerotic heart disease of native coronary artery without angina pectoris: Secondary | ICD-10-CM | POA: Diagnosis present

## 2016-04-20 DIAGNOSIS — G4730 Sleep apnea, unspecified: Secondary | ICD-10-CM | POA: Diagnosis present

## 2016-04-20 DIAGNOSIS — M4312 Spondylolisthesis, cervical region: Secondary | ICD-10-CM | POA: Diagnosis present

## 2016-04-20 DIAGNOSIS — Z79899 Other long term (current) drug therapy: Secondary | ICD-10-CM

## 2016-04-20 DIAGNOSIS — K219 Gastro-esophageal reflux disease without esophagitis: Secondary | ICD-10-CM | POA: Diagnosis present

## 2016-04-20 DIAGNOSIS — M5011 Cervical disc disorder with radiculopathy,  high cervical region: Secondary | ICD-10-CM | POA: Diagnosis present

## 2016-04-20 DIAGNOSIS — M542 Cervicalgia: Secondary | ICD-10-CM | POA: Diagnosis present

## 2016-04-20 DIAGNOSIS — E669 Obesity, unspecified: Secondary | ICD-10-CM | POA: Diagnosis not present

## 2016-04-20 DIAGNOSIS — Z79891 Long term (current) use of opiate analgesic: Secondary | ICD-10-CM | POA: Diagnosis not present

## 2016-04-20 DIAGNOSIS — E119 Type 2 diabetes mellitus without complications: Secondary | ICD-10-CM | POA: Diagnosis not present

## 2016-04-20 DIAGNOSIS — M4316 Spondylolisthesis, lumbar region: Secondary | ICD-10-CM | POA: Diagnosis present

## 2016-04-20 DIAGNOSIS — Z881 Allergy status to other antibiotic agents status: Secondary | ICD-10-CM

## 2016-04-20 DIAGNOSIS — Z794 Long term (current) use of insulin: Secondary | ICD-10-CM

## 2016-04-20 DIAGNOSIS — E7800 Pure hypercholesterolemia, unspecified: Secondary | ICD-10-CM | POA: Diagnosis present

## 2016-04-20 DIAGNOSIS — Z833 Family history of diabetes mellitus: Secondary | ICD-10-CM | POA: Diagnosis not present

## 2016-04-20 DIAGNOSIS — F329 Major depressive disorder, single episode, unspecified: Secondary | ICD-10-CM | POA: Diagnosis present

## 2016-04-20 DIAGNOSIS — Z791 Long term (current) use of non-steroidal anti-inflammatories (NSAID): Secondary | ICD-10-CM | POA: Diagnosis not present

## 2016-04-20 DIAGNOSIS — J45909 Unspecified asthma, uncomplicated: Secondary | ICD-10-CM | POA: Diagnosis present

## 2016-04-20 DIAGNOSIS — Z981 Arthrodesis status: Secondary | ICD-10-CM | POA: Diagnosis not present

## 2016-04-20 DIAGNOSIS — M5412 Radiculopathy, cervical region: Secondary | ICD-10-CM | POA: Diagnosis not present

## 2016-04-20 HISTORY — DX: Radiculopathy, cervical region: M54.12

## 2016-04-20 HISTORY — DX: Acute pancreatitis without necrosis or infection, unspecified: K85.90

## 2016-04-20 HISTORY — PX: ANTERIOR CERVICAL DECOMP/DISCECTOMY FUSION: SHX1161

## 2016-04-20 HISTORY — DX: Major depressive disorder, single episode, unspecified: F32.9

## 2016-04-20 HISTORY — DX: Depression, unspecified: F32.A

## 2016-04-20 LAB — GLUCOSE, CAPILLARY
GLUCOSE-CAPILLARY: 174 mg/dL — AB (ref 65–99)
GLUCOSE-CAPILLARY: 171 mg/dL — AB (ref 65–99)
Glucose-Capillary: 240 mg/dL — ABNORMAL HIGH (ref 65–99)

## 2016-04-20 LAB — CBC
HEMATOCRIT: 38.9 % (ref 36.0–46.0)
HEMOGLOBIN: 12.2 g/dL (ref 12.0–15.0)
MCH: 28.4 pg (ref 26.0–34.0)
MCHC: 31.4 g/dL (ref 30.0–36.0)
MCV: 90.7 fL (ref 78.0–100.0)
Platelets: 409 10*3/uL — ABNORMAL HIGH (ref 150–400)
RBC: 4.29 MIL/uL (ref 3.87–5.11)
RDW: 13.5 % (ref 11.5–15.5)
WBC: 8.8 10*3/uL (ref 4.0–10.5)

## 2016-04-20 LAB — BASIC METABOLIC PANEL
ANION GAP: 9 (ref 5–15)
BUN: 16 mg/dL (ref 6–20)
CALCIUM: 9.5 mg/dL (ref 8.9–10.3)
CO2: 26 mmol/L (ref 22–32)
Chloride: 103 mmol/L (ref 101–111)
Creatinine, Ser: 0.77 mg/dL (ref 0.44–1.00)
GFR calc non Af Amer: >60 mL/min (ref >60)
Glucose, Bld: 179 mg/dL — ABNORMAL HIGH (ref 65–99)
Potassium: 4.3 mmol/L (ref 3.5–5.1)
Sodium: 138 mmol/L (ref 135–145)

## 2016-04-20 LAB — ABO/RH: ABO/RH(D): A POS

## 2016-04-20 SURGERY — ANTERIOR CERVICAL DECOMPRESSION/DISCECTOMY FUSION 1 LEVEL
Anesthesia: General

## 2016-04-20 MED ORDER — PROPOFOL 10 MG/ML IV BOLUS
INTRAVENOUS | Status: DC | PRN
  Administered 2016-04-20: 30 mg via INTRAVENOUS
  Administered 2016-04-20: 100 mg via INTRAVENOUS

## 2016-04-20 MED ORDER — THROMBIN 5000 UNITS EX SOLR
CUTANEOUS | Status: DC | PRN
  Administered 2016-04-20 (×2): 5000 [IU] via TOPICAL

## 2016-04-20 MED ORDER — ZOLPIDEM TARTRATE 5 MG PO TABS: 5.0000 mg | ORAL_TABLET | Freq: Every evening | ORAL | Status: DC | PRN

## 2016-04-20 MED ORDER — LACTATED RINGERS IV SOLN
INTRAVENOUS | Status: DC
  Administered 2016-04-20: 12:00:00 via INTRAVENOUS

## 2016-04-20 MED ORDER — METFORMIN HCL ER 500 MG PO TB24
500.0000 mg | ORAL_TABLET | Freq: Every day | ORAL | Status: DC
Start: 2016-04-20 — End: 2016-04-21
  Administered 2016-04-20: 500 mg via ORAL
  Filled 2016-04-20: qty 1

## 2016-04-20 MED ORDER — METOPROLOL SUCCINATE ER 25 MG PO TB24
25.0000 mg | ORAL_TABLET | Freq: Every morning | ORAL | Status: DC
  Administered 2016-04-21: 25 mg via ORAL
  Filled 2016-04-20 (×2): qty 1

## 2016-04-20 MED ORDER — PHENYLEPHRINE 40 MCG/ML (10ML) SYRINGE FOR IV PUSH (FOR BLOOD PRESSURE SUPPORT)
PREFILLED_SYRINGE | INTRAVENOUS | Status: AC
  Filled 2016-04-20: qty 10

## 2016-04-20 MED ORDER — FENTANYL CITRATE (PF) 100 MCG/2ML IJ SOLN
INTRAMUSCULAR | Status: DC | PRN
  Administered 2016-04-20: 150 ug via INTRAVENOUS

## 2016-04-20 MED ORDER — INSULIN ASPART 100 UNIT/ML ~~LOC~~ SOLN
0.0000 [IU] | Freq: Every day | SUBCUTANEOUS | Status: DC
  Administered 2016-04-20: 2 [IU] via SUBCUTANEOUS

## 2016-04-20 MED ORDER — THROMBIN 5000 UNITS EX SOLR
OROMUCOSAL | Status: DC | PRN
  Administered 2016-04-20: 15:00:00 via TOPICAL

## 2016-04-20 MED ORDER — ONDANSETRON HCL 4 MG/2ML IJ SOLN: 4.0000 mg | Freq: Once | INTRAMUSCULAR | Status: DC | PRN

## 2016-04-20 MED ORDER — MORPHINE SULFATE (PF) 2 MG/ML IV SOLN
1.0000 mg | INTRAVENOUS | Status: DC | PRN
  Administered 2016-04-20 – 2016-04-21 (×3): 2 mg via INTRAVENOUS
  Filled 2016-04-20 (×3): qty 1

## 2016-04-20 MED ORDER — ROCURONIUM BROMIDE 10 MG/ML (PF) SYRINGE
PREFILLED_SYRINGE | INTRAVENOUS | Status: DC | PRN
  Administered 2016-04-20: 40 mg via INTRAVENOUS

## 2016-04-20 MED ORDER — GABAPENTIN 300 MG PO CAPS
300.0000 mg | ORAL_CAPSULE | Freq: Every day | ORAL | Status: DC
  Administered 2016-04-21: 300 mg via ORAL
  Filled 2016-04-20: qty 1

## 2016-04-20 MED ORDER — FENTANYL CITRATE (PF) 100 MCG/2ML IJ SOLN
INTRAMUSCULAR | Status: AC
  Filled 2016-04-20: qty 4

## 2016-04-20 MED ORDER — LIDOCAINE 2% (20 MG/ML) 5 ML SYRINGE
INTRAMUSCULAR | Status: DC | PRN
  Administered 2016-04-20: 100 mg via INTRAVENOUS

## 2016-04-20 MED ORDER — PROPOFOL 10 MG/ML IV BOLUS
INTRAVENOUS | Status: AC
  Filled 2016-04-20: qty 20

## 2016-04-20 MED ORDER — CEFAZOLIN IN D5W 1 GM/50ML IV SOLN
1.0000 g | Freq: Three times a day (TID) | INTRAVENOUS | Status: AC
  Administered 2016-04-20 – 2016-04-21 (×2): 1 g via INTRAVENOUS
  Filled 2016-04-20 (×3): qty 50

## 2016-04-20 MED ORDER — 0.9 % SODIUM CHLORIDE (POUR BTL) OPTIME
TOPICAL | Status: DC | PRN
  Administered 2016-04-20: 1000 mL

## 2016-04-20 MED ORDER — ONDANSETRON HCL 4 MG/2ML IJ SOLN: 4.0000 mg | INTRAMUSCULAR | Status: DC | PRN

## 2016-04-20 MED ORDER — MEPERIDINE HCL 25 MG/ML IJ SOLN: 6.2500 mg | INTRAMUSCULAR | Status: DC | PRN

## 2016-04-20 MED ORDER — PHENYLEPHRINE 40 MCG/ML (10ML) SYRINGE FOR IV PUSH (FOR BLOOD PRESSURE SUPPORT)
PREFILLED_SYRINGE | INTRAVENOUS | Status: DC | PRN
  Administered 2016-04-20 (×9): 80 ug via INTRAVENOUS

## 2016-04-20 MED ORDER — INSULIN ASPART 100 UNIT/ML ~~LOC~~ SOLN
0.0000 [IU] | Freq: Three times a day (TID) | SUBCUTANEOUS | Status: DC
  Administered 2016-04-21: 8 [IU] via SUBCUTANEOUS
  Administered 2016-04-21: 7 [IU] via SUBCUTANEOUS

## 2016-04-20 MED ORDER — GABAPENTIN 300 MG PO CAPS
600.0000 mg | ORAL_CAPSULE | Freq: Every day | ORAL | Status: DC
  Administered 2016-04-20: 600 mg via ORAL
  Filled 2016-04-20: qty 2

## 2016-04-20 MED ORDER — ONDANSETRON HCL 4 MG/2ML IJ SOLN
INTRAMUSCULAR | Status: AC
  Filled 2016-04-20: qty 2

## 2016-04-20 MED ORDER — ROCURONIUM BROMIDE 10 MG/ML (PF) SYRINGE
PREFILLED_SYRINGE | INTRAVENOUS | Status: AC
  Filled 2016-04-20: qty 10

## 2016-04-20 MED ORDER — SODIUM CHLORIDE 0.9% FLUSH
3.0000 mL | Freq: Two times a day (BID) | INTRAVENOUS | Status: DC
  Administered 2016-04-21: 3 mL via INTRAVENOUS

## 2016-04-20 MED ORDER — CEFAZOLIN SODIUM-DEXTROSE 2-4 GM/100ML-% IV SOLN
2.0000 g | INTRAVENOUS | Status: AC
  Administered 2016-04-20: 2 g via INTRAVENOUS

## 2016-04-20 MED ORDER — SODIUM CHLORIDE 0.9% FLUSH: 3.0000 mL | INTRAVENOUS | Status: DC | PRN

## 2016-04-20 MED ORDER — MENTHOL 3 MG MT LOZG: 1.0000 | LOZENGE | OROMUCOSAL | Status: DC | PRN

## 2016-04-20 MED ORDER — ARTIFICIAL TEARS OP OINT
TOPICAL_OINTMENT | OPHTHALMIC | Status: AC
  Filled 2016-04-20: qty 3.5

## 2016-04-20 MED ORDER — SODIUM CHLORIDE 0.9 % IV SOLN: 250.0000 mL | INTRAVENOUS | Status: DC

## 2016-04-20 MED ORDER — OXYCODONE-ACETAMINOPHEN 5-325 MG PO TABS
1.0000 | ORAL_TABLET | ORAL | Status: DC | PRN
  Administered 2016-04-20 – 2016-04-21 (×4): 2 via ORAL
  Filled 2016-04-20 (×4): qty 2

## 2016-04-20 MED ORDER — HYDROMORPHONE HCL 1 MG/ML IJ SOLN
INTRAMUSCULAR | Status: AC
  Filled 2016-04-20: qty 0.5

## 2016-04-20 MED ORDER — ACETAMINOPHEN 650 MG RE SUPP: 650.0000 mg | RECTAL | Status: DC | PRN

## 2016-04-20 MED ORDER — MIDAZOLAM HCL 2 MG/2ML IJ SOLN
INTRAMUSCULAR | Status: AC
  Filled 2016-04-20: qty 2

## 2016-04-20 MED ORDER — CHLORHEXIDINE GLUCONATE CLOTH 2 % EX PADS: 6.0000 | MEDICATED_PAD | Freq: Once | CUTANEOUS | Status: DC

## 2016-04-20 MED ORDER — MIDAZOLAM HCL 5 MG/5ML IJ SOLN
INTRAMUSCULAR | Status: DC | PRN
  Administered 2016-04-20: 2 mg via INTRAVENOUS

## 2016-04-20 MED ORDER — ACETAMINOPHEN 325 MG PO TABS
650.0000 mg | ORAL_TABLET | ORAL | Status: DC | PRN
  Administered 2016-04-21: 650 mg via ORAL
  Filled 2016-04-20: qty 2

## 2016-04-20 MED ORDER — PHENOL 1.4 % MT LIQD: 1.0000 | OROMUCOSAL | Status: DC | PRN

## 2016-04-20 MED ORDER — HEMOSTATIC AGENTS (NO CHARGE) OPTIME
TOPICAL | Status: DC | PRN
  Administered 2016-04-20: 1 via TOPICAL

## 2016-04-20 MED ORDER — SUGAMMADEX SODIUM 200 MG/2ML IV SOLN
INTRAVENOUS | Status: AC
  Filled 2016-04-20: qty 2

## 2016-04-20 MED ORDER — SODIUM CHLORIDE 0.9 % IV SOLN
INTRAVENOUS | Status: DC
  Administered 2016-04-20: 19:00:00 via INTRAVENOUS

## 2016-04-20 MED ORDER — ONDANSETRON HCL 4 MG/2ML IJ SOLN
INTRAMUSCULAR | Status: DC | PRN
  Administered 2016-04-20: 4 mg via INTRAVENOUS

## 2016-04-20 MED ORDER — DULOXETINE HCL 60 MG PO CPEP
60.0000 mg | ORAL_CAPSULE | Freq: Every day | ORAL | Status: DC
Start: 2016-04-20 — End: 2016-04-21
  Administered 2016-04-20: 60 mg via ORAL
  Filled 2016-04-20: qty 1

## 2016-04-20 MED ORDER — HYDROMORPHONE HCL 1 MG/ML IJ SOLN: 0.2500 mg | INTRAMUSCULAR | Status: DC | PRN

## 2016-04-20 MED ORDER — CYCLOBENZAPRINE HCL 10 MG PO TABS
10.0000 mg | ORAL_TABLET | Freq: Three times a day (TID) | ORAL | Status: DC | PRN
  Administered 2016-04-21: 10 mg via ORAL
  Filled 2016-04-20: qty 1

## 2016-04-20 MED ORDER — LOSARTAN POTASSIUM 25 MG PO TABS
25.0000 mg | ORAL_TABLET | Freq: Every morning | ORAL | Status: DC
  Administered 2016-04-21: 25 mg via ORAL
  Filled 2016-04-20: qty 1

## 2016-04-20 MED ORDER — PHENYLEPHRINE HCL 10 MG/ML IJ SOLN
INTRAVENOUS | Status: DC | PRN
  Administered 2016-04-20: 20 ug/min via INTRAVENOUS

## 2016-04-20 MED ORDER — THROMBIN 5000 UNITS EX SOLR
CUTANEOUS | Status: AC
  Filled 2016-04-20: qty 15000

## 2016-04-20 MED ORDER — DOCUSATE SODIUM 100 MG PO CAPS
100.0000 mg | ORAL_CAPSULE | Freq: Two times a day (BID) | ORAL | Status: DC
  Administered 2016-04-20 – 2016-04-21 (×2): 100 mg via ORAL
  Filled 2016-04-20 (×2): qty 1

## 2016-04-20 MED ORDER — LACTATED RINGERS IV SOLN
INTRAVENOUS | Status: DC | PRN
  Administered 2016-04-20 (×2): via INTRAVENOUS

## 2016-04-20 MED ORDER — LIDOCAINE 2% (20 MG/ML) 5 ML SYRINGE
INTRAMUSCULAR | Status: AC
  Filled 2016-04-20: qty 5

## 2016-04-20 MED ORDER — SUGAMMADEX SODIUM 200 MG/2ML IV SOLN
INTRAVENOUS | Status: DC | PRN
  Administered 2016-04-20: 217.8 mg via INTRAVENOUS

## 2016-04-20 MED ORDER — PHENYLEPHRINE HCL 10 MG/ML IJ SOLN
INTRAMUSCULAR | Status: AC
  Filled 2016-04-20: qty 1

## 2016-04-20 MED ORDER — SUCCINYLCHOLINE CHLORIDE 200 MG/10ML IV SOSY
PREFILLED_SYRINGE | INTRAVENOUS | Status: AC
  Filled 2016-04-20: qty 10

## 2016-04-20 MED ORDER — HYDROMORPHONE HCL 1 MG/ML IJ SOLN
0.2500 mg | INTRAMUSCULAR | Status: DC | PRN
  Administered 2016-04-20: 0.5 mg via INTRAVENOUS

## 2016-04-20 MED ORDER — SUCCINYLCHOLINE CHLORIDE 200 MG/10ML IV SOSY
PREFILLED_SYRINGE | INTRAVENOUS | Status: DC | PRN
  Administered 2016-04-20: 140 mg via INTRAVENOUS

## 2016-04-20 MED ORDER — CEFAZOLIN SODIUM-DEXTROSE 2-4 GM/100ML-% IV SOLN
INTRAVENOUS | Status: AC
  Filled 2016-04-20: qty 100

## 2016-04-20 MED ORDER — ATORVASTATIN CALCIUM 40 MG PO TABS
40.0000 mg | ORAL_TABLET | Freq: Every day | ORAL | Status: DC
Start: 2016-04-20 — End: 2016-04-21
  Administered 2016-04-20: 40 mg via ORAL
  Filled 2016-04-20: qty 1

## 2016-04-20 SURGICAL SUPPLY — 52 items
BENZOIN TINCTURE PRP APPL 2/3 (GAUZE/BANDAGES/DRESSINGS) ×2 IMPLANT
BIT DRILL SPINE QC 12 (BIT) ×2 IMPLANT
BLADE ULTRA TIP 2M (BLADE) ×2 IMPLANT
BNDG GAUZE ELAST 4 BULKY (GAUZE/BANDAGES/DRESSINGS) ×4 IMPLANT
BUR BARREL STRAIGHT FLUTE 4.0 (BURR) IMPLANT
BUR MATCHSTICK NEURO 3.0 LAGG (BURR) ×2 IMPLANT
CANISTER SUCT 3000ML PPV (MISCELLANEOUS) ×2 IMPLANT
CARTRIDGE OIL MAESTRO DRILL (MISCELLANEOUS) ×1 IMPLANT
COVER MAYO STAND STRL (DRAPES) ×2 IMPLANT
DIFFUSER DRILL AIR PNEUMATIC (MISCELLANEOUS) ×2 IMPLANT
DRAPE C-ARM 42X72 X-RAY (DRAPES) ×4 IMPLANT
DRAPE LAPAROTOMY 100X72 PEDS (DRAPES) ×2 IMPLANT
DRAPE MICROSCOPE LEICA (MISCELLANEOUS) ×2 IMPLANT
DRAPE POUCH INSTRU U-SHP 10X18 (DRAPES) ×2 IMPLANT
DRSG OPSITE POSTOP 4X6 (GAUZE/BANDAGES/DRESSINGS) ×2 IMPLANT
DURAPREP 6ML APPLICATOR 50/CS (WOUND CARE) ×2 IMPLANT
ELECT BLADE 4.0 EZ CLEAN MEGAD (MISCELLANEOUS) ×2
ELECT REM PT RETURN 9FT ADLT (ELECTROSURGICAL) ×2
ELECTRODE BLDE 4.0 EZ CLN MEGD (MISCELLANEOUS) ×1 IMPLANT
ELECTRODE REM PT RTRN 9FT ADLT (ELECTROSURGICAL) ×1 IMPLANT
GAUZE SPONGE 4X4 12PLY STRL (GAUZE/BANDAGES/DRESSINGS) ×2 IMPLANT
GAUZE SPONGE 4X4 16PLY XRAY LF (GAUZE/BANDAGES/DRESSINGS) IMPLANT
GLOVE BIOGEL M 8.0 STRL (GLOVE) ×2 IMPLANT
GLOVE ECLIPSE 7.5 STRL STRAW (GLOVE) ×6 IMPLANT
GLOVE EXAM NITRILE LRG STRL (GLOVE) IMPLANT
GLOVE EXAM NITRILE XL STR (GLOVE) IMPLANT
GLOVE EXAM NITRILE XS STR PU (GLOVE) IMPLANT
GOWN STRL REUS W/ TWL LRG LVL3 (GOWN DISPOSABLE) ×1 IMPLANT
GOWN STRL REUS W/ TWL XL LVL3 (GOWN DISPOSABLE) ×1 IMPLANT
GOWN STRL REUS W/TWL 2XL LVL3 (GOWN DISPOSABLE) IMPLANT
GOWN STRL REUS W/TWL LRG LVL3 (GOWN DISPOSABLE) ×1
GOWN STRL REUS W/TWL XL LVL3 (GOWN DISPOSABLE) ×1
HEMOSTAT POWDER KIT SURGIFOAM (HEMOSTASIS) ×2 IMPLANT
KIT BASIN OR (CUSTOM PROCEDURE TRAY) ×2 IMPLANT
KIT ROOM TURNOVER OR (KITS) ×2 IMPLANT
NEEDLE SPNL 22GX3.5 QUINCKE BK (NEEDLE) ×2 IMPLANT
NS IRRIG 1000ML POUR BTL (IV SOLUTION) ×2 IMPLANT
OIL CARTRIDGE MAESTRO DRILL (MISCELLANEOUS) ×2
PACK LAMINECTOMY NEURO (CUSTOM PROCEDURE TRAY) ×2 IMPLANT
PATTIES SURGICAL .5 X1 (DISPOSABLE) ×2 IMPLANT
PLATE CERVICAL 1 LVL 15MM (Plate) ×2 IMPLANT
RASP 3.0MM (RASP) ×2 IMPLANT
RUBBERBAND STERILE (MISCELLANEOUS) ×4 IMPLANT
SCREW SELF TAP VARIABLE 4.6X12 (Screw) ×4 IMPLANT
SPACER CERVICAL FRGE 12X14X6-7 (Spacer) ×2 IMPLANT
SPONGE INTESTINAL PEANUT (DISPOSABLE) ×4 IMPLANT
SPONGE SURGIFOAM ABS GEL SZ50 (HEMOSTASIS) ×2 IMPLANT
STRIP CLOSURE SKIN 1/2X4 (GAUZE/BANDAGES/DRESSINGS) ×2 IMPLANT
SUT VIC AB 3-0 SH 8-18 (SUTURE) ×2 IMPLANT
TOWEL OR 17X24 6PK STRL BLUE (TOWEL DISPOSABLE) ×2 IMPLANT
TOWEL OR 17X26 10 PK STRL BLUE (TOWEL DISPOSABLE) ×2 IMPLANT
WATER STERILE IRR 1000ML POUR (IV SOLUTION) ×2 IMPLANT

## 2016-04-20 NOTE — Progress Notes (Signed)
Orthopedic Tech Progress Note Patient Details:  [name] 1960-12-21 [id number]  Ortho Devices Type of Ortho Device: Soft collar Ortho Device/Splint Location: Soft Collar for Neck Ortho Device/Splint Interventions: Application, Adjustment   Kristopher Oppenheim 04/20/2016, 6:25 PM

## 2016-04-20 NOTE — Anesthesia Preprocedure Evaluation (Addendum)
Anesthesia Evaluation  Patient identified by MRN, date of birth, ID band Patient awake    Reviewed: Allergy & Precautions, NPO status , Patient's Chart, lab work & pertinent test results  Airway Mallampati: II  TM Distance: >3 FB Neck ROM: Full    Dental  (+) Teeth Intact, Dental Advisory Given   Pulmonary asthma , sleep apnea ,    Pulmonary exam normal        Cardiovascular Normal cardiovascular exam     Neuro/Psych  Headaches, PSYCHIATRIC DISORDERS Depression    GI/Hepatic GERD  Medicated,  Endo/Other  diabetes, Type 2, Insulin Dependent, Oral Hypoglycemic Agents  Renal/GU CRFRenal disease     Musculoskeletal  (+) Arthritis ,   Abdominal   Peds  Hematology   Anesthesia Other Findings   Reproductive/Obstetrics                            Anesthesia Physical Anesthesia Plan  ASA: III  Anesthesia Plan: General   Post-op Pain Management:    Induction: Intravenous  Airway Management Planned: Oral ETT  Additional Equipment:   Intra-op Plan:   Post-operative Plan: Extubation in OR  Informed Consent: I have reviewed the patients History and Physical, chart, labs and discussed the procedure including the risks, benefits and alternatives for the proposed anesthesia with the patient or authorized representative who has indicated his/her understanding and acceptance.   Dental advisory given  Plan Discussed with: CRNA and Surgeon  Anesthesia Plan Comments:        Anesthesia Quick Evaluation

## 2016-04-20 NOTE — Anesthesia Postprocedure Evaluation (Signed)
Anesthesia Post Note  Patient: [name]  Procedure(s) Performed: Procedure(s) (LRB): CERVICAL THREE-FOUR  ANTERIOR CERVICAL DECOMPRESSION/DISCECTOMY/FUSION WITH PARTIAL REMOVAL OF PLATE AT CERVICAL FOUR (N/A)  Patient location during evaluation: PACU Anesthesia Type: General Level of consciousness: awake and alert and patient cooperative Pain management: pain level controlled Vital Signs Assessment: post-procedure vital signs reviewed and stable Respiratory status: spontaneous breathing and respiratory function stable Cardiovascular status: stable Anesthetic complications: no    Last Vitals:  Vitals:   04/20/16 1645 04/20/16 1743  BP:    Pulse: 82   Resp: 12   Temp:  36.1 C    Last Pain:  Vitals:   04/20/16 1730  TempSrc:   PainSc: [place]

## 2016-04-20 NOTE — H&P (Signed)
NAMEJAELEEN, [name]               ACCOUNT NO.:  [id number]  MEDICAL RECORD NO.:  [id number]  LOCATION:                                 FACILITY:  PHYSICIAN:  Leeroy Cha, M.D.   DATE OF BIRTH:  1960-12-07  DATE OF ADMISSION:  04/20/2016 DATE OF DISCHARGE:                             HISTORY & PHYSICAL   HISTORY OF PRESENT ILLNESS:  [name] is a lady who was seen back in October of this year complaining of neck pain with radiation to the shoulder, problem with balance, back pain radiation to both lower extremity, left leg worse than the right one.  This lady had anterior decompression from cervical 4-7 by me.  Nevertheless, she feels that lately she is getting worse and she has feeling of weakness in the lower extremity and the only way to relieve the pain is when she bends forward.  PAST SURGICAL HISTORY:  Cervical fusion, carpal tunnel surgery, gallbladder surgery.  ALLERGIES:  She is allergic to [facility].  MEDICATIONS:  At the present time, she is taking Mobic, gabapentin, tramadol, metformin for diabetes, hydrocodone for pain, metoprolol for blood pressure.  FAMILY HISTORY:  Positive for diabetes and coronary artery disease.  SOCIAL HISTORY:  The patient does not smoke.  She drinks socially.  REVIEW OF SYSTEMS:  Positive for balance disturbance, high cholesterol, leg pain, kidney stone, leg weakness, back pain, swelling of the joint.  PHYSICAL EXAMINATION:  VITAL SIGNS:  The patient is 230 pounds, height is 5 feet and 3 inches tall. HEAD, EYES, EARS, NOSE, AND THROAT:  Normal. NECK:  She has a scar on the left side.  She is able to flex, but extension and lateral flexion causes discomfort going to the shoulder. LUNGS:  Clear. CARDIOVASCULAR:  Normal. ABDOMEN:  Normal. EXTREMITIES:  Normal.  There is a scar from previous surgery, both hands.  She has grade 1 edema. NEURO:  Mental status is normal.  Cranial nerves normal. MOTOR:  She has some mild weakness of  the deltoid and mild weakness of the dorsiflexion of both feet.  Sensory normal.  Reflexes normal.  IMAGING:  We did a cervical and lumbar myelogram.  The lumbar myelogram showed that she has stenosis with step-off at the level of lumbar 4-5. She has narrowing and facet arthropathy at the 3-4, 2-3, and borderline between 5-1.  In the cervical area, she has progressive degenerative disk disease with more stenosis.  CLINICAL IMPRESSION:  Cervical stenosis, 3-4.  Medial lumbar spondylolisthesis with stenosis and multiple level foraminal stenosis.  RECOMMENDATIONS:  The patient is fully aware that I will be leaving the practice at the end of the year.  I referred her to Dr. Arnoldo Morale.  She wants to proceed with surgery at the neck by me, which will be anterior cervical 3-4.  The surgery was fully explained to her, which is similar to the one she had before as it is only one level.  She is aware of bleeding, paralysis, no improvement whatsoever, need of further surgery. In relation to the lumbar area, she wanted to be taken care by Dr. Newman Pies for the possibility of decompression and fusion.  Dr. Arnoldo Morale fully agreed  with the arrangement.    ______________________________ Leeroy Cha, M.D.   ______________________________ Leeroy Cha, M.D.    EB/MEDQ  D:  04/19/2016  T:  04/20/2016  Job:  [id number]

## 2016-04-20 NOTE — Transfer of Care (Signed)
Immediate Anesthesia Transfer of Care Note  Patient: [name]  Procedure(s) Performed: Procedure(s): CERVICAL THREE-FOUR  ANTERIOR CERVICAL DECOMPRESSION/DISCECTOMY/FUSION WITH PARTIAL REMOVAL OF PLATE AT CERVICAL FOUR (N/A)  Patient Location: PACU  Anesthesia Type:General  Level of Consciousness: awake, alert  and oriented  Airway & Oxygen Therapy: Patient Spontanous Breathing and Patient connected to nasal cannula oxygen  Post-op Assessment: Report given to RN, Post -op Vital signs reviewed and stable and Patient moving all extremities X 4  Post vital signs: Reviewed and stable  Last Vitals:  Vitals:   04/20/16 1222  BP: 120/60  Pulse: 76  Resp: 20  Temp: 36.9 C    Last Pain:  Vitals:   04/20/16 1222  TempSrc: Oral      Patients Stated Pain Goal: 3 ([id number])  Complications: No apparent anesthesia complications

## 2016-04-20 NOTE — Progress Notes (Signed)
1800 Received pt from PACU . A&O x4, left anterior neck dressing dry and intact. Soft collar on. Pt with good and equal strengths to BUE. Denies numbness, no tingling.  On high back rest in bed.

## 2016-04-20 NOTE — Anesthesia Procedure Notes (Addendum)
Procedure Name: Intubation Date/Time: 04/20/2016 2:24 PM Performed by: Garrison [place] T Pre-anesthesia Checklist: Patient identified, Emergency Drugs available, Suction available and Patient being monitored Patient Re-evaluated:Patient Re-evaluated prior to inductionOxygen Delivery Method: Circle System Utilized Preoxygenation: Pre-oxygenation with 100% oxygen Intubation Type: IV induction Ventilation: Mask ventilation without difficulty and Oral airway inserted - appropriate to patient size Laryngoscope Size: Mac, 3, Glidescope and 4 Grade View: Grade II Tube type: Oral Tube size: 7.5 mm Number of attempts: 3 Airway Equipment and Method: Stylet,  Oral airway and Video-laryngoscopy Placement Confirmation: ETT inserted through vocal cords under direct vision,  positive ETCO2 and breath sounds checked- equal and bilateral Secured at: 22 cm Tube secured with: Tape Dental Injury: Teeth and Oropharynx as per pre-operative assessment  Difficulty Due To: Difficulty was anticipated, Difficult Airway- due to anterior larynx, Difficult Airway-  due to edematous airway and Difficult Airway- due to large tongue Future Recommendations: Recommend- induction with short-acting agent, and alternative techniques readily available Comments: DL x 1 with Mac 3 by CRNA to obtain grade 4 view. DL x 1 by MDA with Mac 3 to obtain grade 2 view. Esophogeal intubation by MD. Removed ETT and preceded with glidescope intubation by CRNA, using glidescope 4 blade. Grade 2 view obtained by CRNA.

## 2016-04-21 LAB — GLUCOSE, CAPILLARY
Glucose-Capillary: 221 mg/dL — ABNORMAL HIGH (ref 65–99)
Glucose-Capillary: 219 mg/dL — ABNORMAL HIGH (ref 65–99)
Glucose-Capillary: 208 mg/dL — ABNORMAL HIGH (ref 65–99)

## 2016-04-21 LAB — TYPE AND SCREEN
ABO/RH(D): A POS
Antibody Screen: NEGATIVE

## 2016-04-21 MED ORDER — OXYCODONE-ACETAMINOPHEN 5-325 MG PO TABS: 1.0000 | ORAL_TABLET | ORAL | 0 refills | Status: DC | PRN

## 2016-04-21 MED ORDER — CYCLOBENZAPRINE HCL 10 MG PO TABS: 10.0000 mg | ORAL_TABLET | Freq: Three times a day (TID) | ORAL | 2 refills | Status: DC | PRN

## 2016-04-21 NOTE — Care Management Note (Signed)
Case Management Note  Patient Details  Name: [name] MRN: [id number] Date of Birth: [birth year]  Subjective/Objective:                    Action/Plan:   Expected Discharge Date:                  Expected Discharge Plan:  Home/Self Care  In-House Referral:     Discharge planning Services  CM Consult  Post Acute Care Choice:  Durable Medical Equipment Choice offered to:  NA  DME Arranged:  Walker rolling DME Agency:  [place] Arranged:  NA [place] Agency:  NA  Status of Service:  Completed, signed off  If discussed at [place] of Stay Meetings, dates discussed:    Additional Comments: CM received call from RN to please arrange for rolling walker. CM notified [place] DME rep, Reggie to please deliver the rolling walker to room prior to discharge. No other CM needs were communicated. Dellie Catholic, RN 04/21/2016, 2:17 PM

## 2016-04-21 NOTE — Evaluation (Signed)
Physical Therapy Evaluation Patient Details Name: [name] MRN: [id number] DOB: 1960-12-31 Today's Date: 04/21/2016   History of Present Illness  Patient is a 55 yo female admitted 04/20/16 with cervical stenosis and worsening pain/weakness especially in RUE.   PMH:  C4-7 ACDF, obesity, back pain  Clinical Impression  Patient presents with problems listed below.  Will benefit from acute PT to maximize functional mobility prior to discharge home with husband.  Recommend f/u OP PT at later time to be determined by MD.    Follow Up Recommendations Supervision for mobility/OOB;Outpatient PT (OP f/u when appropriate for patient post-op)    Equipment Recommendations  Rolling walker with 5" wheels    Recommendations for Other Services       Precautions / Restrictions Precautions Precautions: Cervical;Fall Precaution Comments: Reviewed cervical precautions with patient Required Braces or Orthoses: Cervical Brace Cervical Brace: Soft collar Restrictions Weight Bearing Restrictions: No      Mobility  Bed Mobility               General bed mobility comments: Patient OOB as PT entered room  Transfers Overall transfer level: Needs assistance Equipment used: Rolling walker (2 wheeled) Transfers: Sit to/from Stand Sit to Stand: Min guard         General transfer comment: Verbal cues for safe technique to return to sitting in chair.  Min guard for safety.  Ambulation/Gait Ambulation/Gait assistance: Min guard Ambulation Distance (Feet): 120 Feet (60' with no AD and 56' with RW) Assistive device: None;Rolling walker (2 wheeled) Gait Pattern/deviations: Step-through pattern;Decreased step length - right;Decreased step length - left;Decreased stride length;Shuffle Gait velocity: decreased Gait velocity interpretation: Below normal speed for age/gender General Gait Details: Patient with unsteady gait with no assistive device, reaching for rail/wall/sink for support.   Improved gait balance and safety with use of RW - verbal cues for safe use of RW.   Stairs            Wheelchair Mobility    Modified Rankin (Stroke Patients Only)       Balance Overall balance assessment: Needs assistance         Standing balance support: Single extremity supported Standing balance-Leahy Scale: Poor Standing balance comment: Support of UE for safety and balance.                             Pertinent Vitals/Pain Pain Assessment: 0-10 Pain Score: 6  Pain Location: Neck and back Pain Descriptors / Indicators: Aching;Sore Pain Intervention(s): Monitored during session;Repositioned;Patient requesting pain meds-RN notified    Home Living Family/patient expects to be discharged to:: Private residence Living Arrangements: Spouse/significant other Available Help at Discharge: Family;Available 24 hours/day Type of Home: House Home Access: Stairs to enter Entrance Stairs-Rails: Psychiatric nurse of Steps: 3 Home Layout: One level Home Equipment: None      Prior Function Level of Independence: Independent         Comments: Difficulty performing tasks with RUE above head.     Hand Dominance   Dominant Hand: Right    Extremity/Trunk Assessment   Upper Extremity Assessment: RUE deficits/detail RUE Deficits / Details: Strength grossly 3/5 - 4-/5 RUE: Unable to fully assess due to pain       Lower Extremity Assessment: Generalized weakness      Cervical / Trunk Assessment: Other exceptions  Communication   Communication: No difficulties  Cognition Arousal/Alertness: Awake/alert Behavior During Therapy: WFL for tasks assessed/performed Overall Cognitive  Status: Within Functional Limits for tasks assessed                      General Comments      Exercises     Assessment/Plan    PT Assessment Patient needs continued PT services  PT Problem List Decreased strength;Decreased activity  tolerance;Decreased balance;Decreased mobility;Decreased knowledge of use of DME;Decreased knowledge of precautions;Obesity;Pain          PT Treatment Interventions DME instruction;Gait training;Stair training;Functional mobility training;Therapeutic activities;Patient/family education    PT Goals (Current goals can be found in the Care Plan section)  Acute Rehab PT Goals Patient Stated Goal: To go home PT Goal Formulation: With patient/family Time For Goal Achievement: 04/28/16 Potential to Achieve Goals: Good    Frequency Min 5X/week   Barriers to discharge        Co-evaluation               End of Session Equipment Utilized During Treatment: Cervical collar;Gait belt Activity Tolerance: Patient limited by pain;Patient limited by fatigue Patient left: in chair;with call bell/phone within reach;with family/visitor present;with nursing/sitter in room Nurse Communication: Mobility status;Patient requests pain meds         Time: [id number] PT Time Calculation (min) (ACUTE ONLY): 22 min   Charges:   PT Evaluation $PT Eval Moderate Complexity: 1 Procedure     PT G Codes:        Despina Pole 05-10-2016, 11:26 AM Carita Pian. Sanjuana Kava, [place] Pager [phone]

## 2016-04-21 NOTE — Discharge Summary (Signed)
Date of Admission: 04/20/2016  Date of Discharge: 04/21/16  Admission Diagnosis: Cervical stenosis  Discharge Diagnosis: Same  Procedure Performed: C3-4 ACDF  Attending: Leeroy Cha, MD  Hospital Course:  The patient was admitted for the above listed operation and had an uncomplicated post-operative course.  They were discharged in stable condition.  Follow up: 3 weeks    Medication List    TAKE these medications   acetaminophen 500 MG tablet Commonly known as:  TYLENOL Take 1,000 mg by mouth 2 (two) times daily.   atorvastatin 40 MG tablet Commonly known as:  LIPITOR Take 40 mg by mouth daily at 6 PM.   cyclobenzaprine 10 MG tablet Commonly known as:  FLEXERIL Take 1 tablet (10 mg total) by mouth 3 (three) times daily as needed for muscle spasms.   DULoxetine 60 MG capsule Commonly known as:  CYMBALTA Take 60 mg by mouth at bedtime.   gabapentin 300 MG capsule Commonly known as:  NEURONTIN Take 300-600 mg by mouth 2 (two) times daily. 300 mg in the morning and 600 mg at night   HYDROcodone-acetaminophen 5-325 MG tablet Commonly known as:  NORCO/VICODIN Take 1 tablet by mouth 2 (two) times daily. Reported on 09/06/2015   insulin aspart 100 UNIT/ML FlexPen Commonly known as:  NOVOLOG Inject 25 Units into the skin 3 (three) times daily before meals.   Insulin Glargine-Lixisenatide 100-33 UNT-MCG/ML Sopn Inject 60 Units into the skin daily.   losartan 25 MG tablet Commonly known as:  COZAAR Take 25 mg by mouth every morning.   metFORMIN 500 MG 24 hr tablet Commonly known as:  GLUCOPHAGE-XR Take 500 mg by mouth at bedtime.   metoprolol succinate 25 MG 24 hr tablet Commonly known as:  TOPROL-XL Take 25 mg by mouth every morning.   nabumetone 750 MG tablet Commonly known as:  RELAFEN Take 750 mg by mouth 2 (two) times daily.   omeprazole 40 MG capsule Commonly known as:  PRILOSEC Take 40 mg by mouth 2 (two) times daily.   ondansetron 4 MG  tablet Commonly known as:  ZOFRAN Take 1 tablet (4 mg total) by mouth every 8 (eight) hours as needed for nausea or vomiting.   oxyCODONE-acetaminophen 5-325 MG tablet Commonly known as:  PERCOCET/ROXICET Take 1-2 tablets by mouth every 4 (four) hours as needed for moderate pain.   pioglitazone 30 MG tablet Commonly known as:  ACTOS Take 30 mg by mouth daily.   SUDAFED 12 HOUR PO Take 1 tablet by mouth daily after breakfast. Sinus headache   traMADol 50 MG tablet Commonly known as:  ULTRAM Take 50-100 mg by mouth 2 (two) times daily as needed for moderate pain (depends on pain level if 50-100 mg).   UNIFINE PENTIPS 31G X 5 MM Misc Generic drug:  Insulin Pen Needle   zolpidem 5 MG tablet Commonly known as:  AMBIEN Take 5 mg by mouth at bedtime as needed for sleep. Reported on 08/30/2015            Durable Medical Equipment        Start     Ordered   04/21/16 1209  For home use only DME Walker rolling  Once    Question:  Patient needs a walker to treat with the following condition  Answer:  Cervical spondylosis with radiculopathy   04/21/16 1208

## 2016-04-21 NOTE — Progress Notes (Signed)
No acute events Moving arms and legs well Home today

## 2016-04-21 NOTE — Progress Notes (Signed)
Discharge instructions gone over with patient and spouse. Home medications gone over. Prescription given. Spouse ordered rolling walker for patient, because walker had not been delivered to room in over 2 hours. It is noted that delivery of the walker came as patient was getting in elevator to discharge. Neck precautions, lifting instructions and incisional care gone over with patient and spouse. Signs and symptoms of infection and reasons to call the doctor discussed. Follow up appointment to be made. Patient verbalized understanding of instructions.

## 2016-04-21 NOTE — Progress Notes (Signed)
Physical Therapy Treatment Patient Details Name: [name] MRN: [id number] DOB: [birth year] Today's Date: 04/21/2016    History of Present Illness Patient is a 55 yo female admitted 04/20/16 with cervical stenosis and worsening pain/weakness especially in RUE.   PMH:  C4-7 ACDF, obesity, back pain    PT Comments    Patient doing well with mobility and gait.  Able to negotiate stairs with min guard assist.  Patient ready for d/c from PT perspective.  Follow Up Recommendations  Supervision for mobility/OOB;Outpatient PT (OP PT when appropriate)     Equipment Recommendations  Rolling walker with 5" wheels (PT contacted CM for RW)    Recommendations for Other Services       Precautions / Restrictions Precautions Precautions: Cervical;Fall Precaution Comments: Reviewed cervical precautions with patient Required Braces or Orthoses: Cervical Brace Cervical Brace: Soft collar Restrictions Weight Bearing Restrictions: No    Mobility  Bed Mobility               General bed mobility comments: Patient in chair  Transfers Overall transfer level: Needs assistance Equipment used: Rolling walker (2 wheeled) Transfers: Sit to/from Stand Sit to Stand: Min guard         General transfer comment: Uses safe technique.  Ambulation/Gait Ambulation/Gait assistance: Min guard Ambulation Distance (Feet): 86 Feet Assistive device: Rolling walker (2 wheeled) Gait Pattern/deviations: Step-through pattern;Decreased stride length Gait velocity: decreased Gait velocity interpretation: Below normal speed for age/gender General Gait Details: Verbal cues for safe use of RW.  Cues to stand upright.   Stairs Stairs: Yes Stairs assistance: Min guard Stair Management: One rail Right;Step to pattern;Forwards;Sideways Number of Stairs: 3 General stair comments: Verbal cues for safe negotiation of stairs using 1 rail forward and sideways.  Instructed husband on correct guarding  technique.  Patient performed with min guard assist.  Wheelchair Mobility    Modified Rankin (Stroke Patients Only)       Balance Overall balance assessment: Needs assistance         Standing balance support: Single extremity supported Standing balance-Leahy Scale: Poor Standing balance comment: Support of UE for safety and balance.                    Cognition Arousal/Alertness: Awake/alert Behavior During Therapy: WFL for tasks assessed/performed Overall Cognitive Status: Within Functional Limits for tasks assessed                      Exercises      General Comments        Pertinent Vitals/Pain Pain Assessment: 0-10 Pain Score: 3  Pain Location: neck and back Pain Descriptors / Indicators: Aching Pain Intervention(s): Monitored during session;Repositioned    Home Living Family/patient expects to be discharged to:: Private residence Living Arrangements: Spouse/significant other Available Help at Discharge: Family;Available 24 hours/day Type of Home: House Home Access: Stairs to enter Entrance Stairs-Rails: Right;Left Home Layout: One level Home Equipment: None      Prior Function Level of Independence: Independent      Comments: Difficulty performing tasks with RUE above head.   PT Goals (current goals can now be found in the care plan section) Acute Rehab PT Goals Patient Stated Goal: To go home PT Goal Formulation: With patient/family Time For Goal Achievement: 04/28/16 Potential to Achieve Goals: Good Progress towards PT goals: Progressing toward goals    Frequency    Min 5X/week      PT Plan Current plan remains appropriate  Co-evaluation             End of Session Equipment Utilized During Treatment: Cervical collar;Gait belt Activity Tolerance: Patient tolerated treatment well Patient left: in chair;with call bell/phone within reach;with family/visitor present     Time: [id number] PT Time Calculation (min)  (ACUTE ONLY): 9 min  Charges:  $Gait Training: 8-22 mins                    G Codes:      Despina Pole April 22, 2016, 2:38 PM Carita Pian. Sanjuana Kava, [place] Pager [phone]

## 2016-04-21 NOTE — Op Note (Signed)
NAMEKENYONNA, [name] NO.:  [id number]  MEDICAL RECORD NO.:  [id number]  LOCATION:  [facility]                         FACILITY:  [facility]  PHYSICIAN:  Leeroy Cha, M.D.   DATE OF BIRTH:  14-Apr-1961  DATE OF PROCEDURE:  04/20/2016 DATE OF DISCHARGE:                              OPERATIVE REPORT   PREOPERATIVE DIAGNOSES: 1. Cervical 3-4 stenosis with radiculopathy. 2. Status post fusion C4-C7. 3. Diabetes mellitus. 4. Obesity.  POSTOPERATIVE DIAGNOSES: 1. Cervical 3-4 stenosis with radiculopathy. 2. Status post fusion C4-C7. 3. Diabetes mellitus. 4. Obesity.  PROCEDURE:  Anterior cervical 3-4 diskectomy, decompression of the spinal cord, interbody fusion with allograft.  Resection of the upper part of the plate, insertion of new plate from [id number] using 2 screws. Microscope.  SURGEON:  Leeroy Cha, M.D.  ASSISTANT:  Ashok Pall, M.D.  CLINICAL HISTORY:  The patient underwent surgery many years ago.  She had been complaining of some pain in the shoulder associated with some mild instability.  She also had back pain with radiation to both legs. Myelogram showed stenosis with degenerative disk disease, which was getting worse at the level of cervical 3-4 and lumbar stenosis at the level of 4-5 with grade 1 spondylolisthesis.  We are going to proceed with the cervical surgery.  She is aware of the risks and benefits.  In relation to the lumbar spine since I am going to leave the practice, Dr. Arnoldo Morale was selected by her to be her surgeon for the lumbar spine.  DESCRIPTION OF PROCEDURE:  The patient was taken to the OR and after intubation, the left side of the neck was cleaned with DuraPrep and drapes were applied.  Incision resecting the previous upper part of the previous scar was made through the skin and subcutaneous tissue. Dissection was carried down.  The patient has quite a bit of adhesion. Lysis was accomplished as well as hemostasis of small veins.   Then, with x-ray we identified the area between C3-C4.  We brought the microscope, we opened the anterior ligament after we did the lysis of adhesion and with a drill, we were all the way down to the posterior ligament. Decompression was made medial and laterally with plenty of space for the C4 nerve root bilaterally.  The patient had quite a bit of adhesion. Then, the endplates were drilled and allograft of 6 mm height, lordotic was inserted.  From here, we decided to remove the upper part of the plate at the level of C4.  This was done easily with removal of 2 screws.  Then a single plate using 2 screws were used in the midline.   Then the area was irrigated. Hemostasis was done.  Lateral cervical spine x-ray showed good position of the plate at the level of 3-4.  Then, the wound was closed with Vicryl and a Steri-Strip.          ______________________________ Leeroy Cha, M.D.     EB/MEDQ  D:  04/20/2016  T:  04/21/2016  Job:  [id number]

## 2016-04-23 ENCOUNTER — Encounter (HOSPITAL_COMMUNITY): Payer: Self-pay | Admitting: Neurosurgery

## 2016-05-02 MED FILL — traMADol HCL 50 MG TABS: 50 | 30 days supply | Qty: 60 | Fill #3

## 2016-05-02 MED FILL — FLUCONAZOLE 150 MG TABLET: 150 | 1 days supply | Qty: 1 | Fill #0

## 2016-05-03 DIAGNOSIS — Z6841 Body Mass Index (BMI) 40.0 and over, adult: Secondary | ICD-10-CM | POA: Diagnosis not present

## 2016-05-03 DIAGNOSIS — M542 Cervicalgia: Secondary | ICD-10-CM | POA: Diagnosis not present

## 2016-05-03 DIAGNOSIS — M5412 Radiculopathy, cervical region: Secondary | ICD-10-CM | POA: Diagnosis not present

## 2016-05-03 MED FILL — OXYCODONE W/APAP 5/325 TAB: 5-325 | 8 days supply | Qty: 90 | Fill #0

## 2016-05-04 DIAGNOSIS — M48062 Spinal stenosis, lumbar region with neurogenic claudication: Secondary | ICD-10-CM | POA: Diagnosis not present

## 2016-05-04 DIAGNOSIS — M545 Low back pain: Secondary | ICD-10-CM | POA: Diagnosis not present

## 2016-05-04 DIAGNOSIS — M5416 Radiculopathy, lumbar region: Secondary | ICD-10-CM | POA: Diagnosis not present

## 2016-05-04 DIAGNOSIS — Z6841 Body Mass Index (BMI) 40.0 and over, adult: Secondary | ICD-10-CM | POA: Diagnosis not present

## 2016-05-04 DIAGNOSIS — M4316 Spondylolisthesis, lumbar region: Secondary | ICD-10-CM | POA: Diagnosis not present

## 2016-05-04 DIAGNOSIS — I10 Essential (primary) hypertension: Secondary | ICD-10-CM | POA: Diagnosis not present

## 2016-05-07 MED FILL — CYCLOBENZAPRINE 10 MG TAB: 10 | 30 days supply | Qty: 90 | Fill #0

## 2016-05-08 ENCOUNTER — Other Ambulatory Visit: Payer: Self-pay | Admitting: Neurosurgery

## 2016-05-18 DIAGNOSIS — Z01419 Encounter for gynecological examination (general) (routine) without abnormal findings: Secondary | ICD-10-CM | POA: Diagnosis not present

## 2016-05-22 ENCOUNTER — Other Ambulatory Visit: Payer: Self-pay | Admitting: Obstetrics & Gynecology

## 2016-05-22 DIAGNOSIS — Z1231 Encounter for screening mammogram for malignant neoplasm of breast: Secondary | ICD-10-CM

## 2016-05-28 MED FILL — PIOGLITAZONE HCL 30 MG TAB: 30 | 90 days supply | Qty: 90 | Fill #0

## 2016-05-28 MED FILL — METOPROLOL SUCC ER 25 MG TA: 25 | 90 days supply | Qty: 90 | Fill #0

## 2016-05-31 MED FILL — OMEPRAZOLE DR 40 MG CAPSULE: 40 | 90 days supply | Qty: 180 | Fill #0

## 2016-05-31 MED FILL — GABAPENTIN 300 MG CAPSULE: 300 | 90 days supply | Qty: 270 | Fill #0

## 2016-05-31 MED FILL — ATORVASTATIN 40 MG TABLET: 40 | 90 days supply | Qty: 90 | Fill #0

## 2016-05-31 MED FILL — NABUMETONE 750 MG TABLET: 750 | 90 days supply | Qty: 180 | Fill #0

## 2016-05-31 MED FILL — METFORMIN HCL ER 500 MG TAB: 500 | 90 days supply | Qty: 90 | Fill #0

## 2016-05-31 MED FILL — LOSARTAN POTASSIUM 25 MG TA: 25 | 90 days supply | Qty: 90 | Fill #0

## 2016-05-31 MED FILL — DULoxetine HCL 60 MG CPEP: 60 | 90 days supply | Qty: 90 | Fill #0

## 2016-06-01 MED FILL — traMADol HCL 50 MG TABS: 50 | 15 days supply | Qty: 60 | Fill #0

## 2016-06-04 MED FILL — NOVOLOG FLEXPEN SYRINGE: 100 | 20 days supply | Qty: 15 | Fill #2

## 2016-06-04 MED FILL — SOLIQUA 100 UNIT-33 MCG/ML: 100-33 | 25 days supply | Qty: 15 | Fill #2

## 2016-06-12 ENCOUNTER — Encounter (HOSPITAL_COMMUNITY): Payer: Self-pay

## 2016-06-12 ENCOUNTER — Encounter (HOSPITAL_COMMUNITY)
Admission: RE | Admit: 2016-06-12 | Discharge: 2016-06-12 | Disposition: A | Discharge status: Home / Self Care | Payer: 59 | Source: Ambulatory Visit | Attending: Neurosurgery | Admitting: Neurosurgery

## 2016-06-12 DIAGNOSIS — Z01812 Encounter for preprocedural laboratory examination: Secondary | ICD-10-CM | POA: Insufficient documentation

## 2016-06-12 LAB — SURGICAL PCR SCREEN
MRSA, PCR: NEGATIVE
Staphylococcus aureus: NEGATIVE

## 2016-06-12 LAB — BASIC METABOLIC PANEL
ANION GAP: 5 (ref 5–15)
BUN: 15 mg/dL (ref 6–20)
CALCIUM: 9.5 mg/dL (ref 8.9–10.3)
CO2: 26 mmol/L (ref 22–32)
Chloride: 108 mmol/L (ref 101–111)
Creatinine, Ser: 0.86 mg/dL (ref 0.44–1.00)
GFR calc Af Amer: >60 mL/min (ref >60)
GLUCOSE: 164 mg/dL — AB (ref 65–99)
Potassium: 4 mmol/L (ref 3.5–5.1)
SODIUM: 139 mmol/L (ref 135–145)

## 2016-06-12 LAB — TYPE AND SCREEN
ABO/RH(D): A POS
Antibody Screen: NEGATIVE

## 2016-06-12 LAB — CBC
HCT: 36.8 % (ref 36.0–46.0)
Hemoglobin: 11.4 g/dL — ABNORMAL LOW (ref 12.0–15.0)
MCH: 27.6 pg (ref 26.0–34.0)
MCHC: 31 g/dL (ref 30.0–36.0)
MCV: 89.1 fL (ref 78.0–100.0)
PLATELETS: 369 10*3/uL (ref 150–400)
RBC: 4.13 MIL/uL (ref 3.87–5.11)
RDW: 14.4 % (ref 11.5–15.5)
WBC: 8.1 10*3/uL (ref 4.0–10.5)

## 2016-06-12 NOTE — Pre-Procedure Instructions (Signed)
[name]  06/12/2016      [place], [place] - [address] [facility]. [address] [place] [address] Phone: [phone] Fax: [phone]    Your procedure is scheduled on January 29  Report to [place] at [place].M.  Call this number if you have problems the morning of surgery:  [phone]   Remember:  Do not eat food or drink liquids after midnight.   Take these medicines the morning of surgery with A SIP OF WATER acetaminophen (TYLENOL),  gabapentin (NEURONTIN), metoprolol succinate (TOPROL-XL),  omeprazole (PRILOSEC), ondansetron (ZOFRAN), oxyCODONE-acetaminophen (PERCOCET/ROXICET),  7 days prior to surgery STOP taking any Aspirin, Aleve, Naproxen, Ibuprofen, Motrin, Advil, Goody's, BC's, all herbal medications, fish oil, and all vitamins  WHAT DO I DO ABOUT MY DIABETES MEDICATION?   Marland Kitchen Do not take oral diabetes medicines (pills) the morning of surgery.pioglitazone (ACTOS),  metFORMIN (GLUCOPHAGE-XR),   insulin aspart (NOVOLOG) - take usual dose before meals the day before surgery Insulin Glargine-Lixisenatide - take morning dose the day before surgery as usual       . THE MORNING OF SURGERY, take _____30________ units of _Insulin Glargine-Lixisenatide _________insulin.  . The day of surgery, do not take other diabetes injectables, including Byetta (exenatide), Bydureon (exenatide ER), Victoza (liraglutide), or Trulicity (dulaglutide).  . If your CBG is greater than 220 mg/dL, you may take  of your sliding scale (correction) dose of insulin.  How to Manage Your Diabetes Before and After Surgery  Why is it important to control my blood sugar before and after surgery? . Improving blood sugar levels before and after surgery helps healing and can limit problems. . A way of improving blood sugar control is eating a healthy diet by: o  Eating less sugar and carbohydrates o  Increasing  activity/exercise o  Talking with your doctor about reaching your blood sugar goals . High blood sugars (greater than 180 mg/dL) can raise your risk of infections and slow your recovery, so you will need to focus on controlling your diabetes during the weeks before surgery. . Make sure that the doctor who takes care of your diabetes knows about your planned surgery including the date and location.  How do I manage my blood sugar before surgery? . Check your blood sugar at least 4 times a day, starting 2 days before surgery, to make sure that the level is not too high or low. o Check your blood sugar the morning of your surgery when you wake up and every 2 hours until you get to the Short Stay unit. . If your blood sugar is less than 70 mg/dL, you will need to treat for low blood sugar: o Do not take insulin. o Treat a low blood sugar (less than 70 mg/dL) with  cup of clear juice (cranberry or apple), 4 glucose tablets, OR glucose gel. o Recheck blood sugar in 15 minutes after treatment (to make sure it is greater than 70 mg/dL). If your blood sugar is not greater than 70 mg/dL on recheck, call [phone] for further instructions. . Report your blood sugar to the short stay nurse when you get to Short Stay.  . If you are admitted to the hospital after surgery: o Your blood sugar will be checked by the staff and you will probably be given insulin after surgery (instead of oral diabetes medicines) to make sure you have good blood sugar levels. o The goal for blood sugar control  after surgery is 80-180 mg/dL.    Do not wear jewelry, make-up or nail polish.  Do not wear lotions, powders, or perfumes, or deoderant.  Do not shave 48 hours prior to surgery.    Do not bring valuables to the hospital.  [facility] is not responsible for any belongings or valuables.  Contacts, dentures or bridgework may not be worn into surgery.  Leave your suitcase in the car.  After surgery it may be brought to  your room.  For patients admitted to the hospital, discharge time will be determined by your treatment team.  Patients discharged the day of surgery will not be allowed to drive home.    Special instructions:   [facility]- Preparing For Surgery  Before surgery, you can play an important role. Because skin is not sterile, your skin needs to be as free of germs as possible. You can reduce the number of germs on your skin by washing with CHG (chlorahexidine gluconate) Soap before surgery.  CHG is an antiseptic cleaner which kills germs and bonds with the skin to continue killing germs even after washing.  Please do not use if you have an allergy to CHG or antibacterial soaps. If your skin becomes reddened/irritated stop using the CHG.  Do not shave (including legs and underarms) for at least 48 hours prior to first CHG shower. It is OK to shave your face.  Please follow these instructions carefully.   1. Shower the NIGHT BEFORE SURGERY and the MORNING OF SURGERY with CHG.   2. If you chose to wash your hair, wash your hair first as usual with your normal shampoo.  3. After you shampoo, rinse your hair and body thoroughly to remove the shampoo.  4. Use CHG as you would any other liquid soap. You can apply CHG directly to the skin and wash gently with a scrungie or a clean washcloth.   5. Apply the CHG Soap to your body ONLY FROM THE NECK DOWN.  Do not use on open wounds or open sores. Avoid contact with your eyes, ears, mouth and genitals (private parts). Wash genitals (private parts) with your normal soap.  6. Wash thoroughly, paying special attention to the area where your surgery will be performed.  7. Thoroughly rinse your body with warm water from the neck down.  8. DO NOT shower/wash with your normal soap after using and rinsing off the CHG Soap.  9. Pat yourself dry with a CLEAN TOWEL.   10. Wear CLEAN PAJAMAS   11. Place CLEAN SHEETS on your bed the night of your first  shower and DO NOT SLEEP WITH PETS.    Day of Surgery: Do not apply any deodorants/lotions. Please wear clean clothes to the hospital/surgery center.      Please read over the following fact sheets that you were given.

## 2016-06-12 NOTE — Progress Notes (Signed)
PCP - Ramonita Lab Cardiologist - denies  Chest x-ray - not needed EKG - 08-18-15 Stress Test - denies ECHO - denies Cardiac Cath - denies Sleep Study - 09 at [place] Blood Sugar __1-2___ times a day  CPAP - NO  A1c on 04/11/16 at 8.9 Stopped [place] on 1/21  Patient spoke with her doctor that manages her DM and will follow instructions that were given to her by the endocrinologist.  Will send to anesthesia for A1c   Patient denies shortness of breath, fever, cough and chest pain at PAT appointment   Patient verbalized understanding of instructions that was given to them at the PAT appointment. Patient expressed that there were no further questions.  Patient was also instructed that they will need to review over the PAT instructions again at home before the surgery.

## 2016-06-14 ENCOUNTER — Other Ambulatory Visit: Payer: Self-pay

## 2016-06-14 NOTE — Progress Notes (Signed)
Anesthesia Chart Review: Patient is a 56 year old female scheduled for PLIF L3-4,L4-5, L2-3 laminotomies on 06/18/16 by Dr. Arnoldo Morale.  History includes non-smoker, DM2, HLD, asthma, GERD, migraines, depression, OSA (no CPAP), pancreatitis, cholecystectomy '10, C4-7 ACDF '09, C3-4 ACDF 04/20/16, right percutaneous nephrostolithotomy 08/24/15. BMI is consistent with morbid obesity.    PCP is Dr. Ramonita Lab (see Care Everywhere). Endocrinologist is Dr. Lucilla Lame with [facility], last visit 04/16/16. There was mention of patient having c-spine surgery that week and lumbar surgery around late December. A1c then was 8.9, however patient also had an episode of hypoglycemia. She adjusted patient's Novolog and stopped her glimepiride.   Meds include Lipitor, Flexeril, Cymbalta, Neurontin, Novolog, insulin glargine-lixisenatide ([place]), losartan, metformin, Actos, Toprol XL, Relafen, Prilosec, Zofran, tramadol, Ambien.   BP 137/73   Pulse 96   Temp 36.8 C   Resp 20   Ht 5\' 3"  (1.6 m)   Wt 233 lb 8 oz (105.9 kg)   SpO2 97%   BMI 41.36 kg/m   EKG 08/18/15: NSR, low voltage QRS, poor r wave progression. She denied prior stress or echo. She denied SOB and CP at PAT.  Preoperative labs noted. A1c on 04/11/16 (Care Everywhere) was 8.9, called to [facility] at Dr. Adline Mango office although this was a known result prior to December surgery. She will get a fasting CBG on arrival.   Patient underwent recent ACDF. If glucose acceptable and otherwise no acute changes then I would anticipate that she could proceed as planned.  George Hugh [facility] Short Stay Center/Anesthesiology Phone [phone] 06/14/2016 1:06 PM

## 2016-06-14 NOTE — Patient Outreach (Addendum)
[facility]) Care Management  06/14/2016  [name] [birth year] [id number]  Subjective: Telephone call to patient. Discussed [facility] pre-op follow up. Patient voices understanding and agrees to pre-op call.   Objective: per chart review- Patient to be admitted on 06/18/16 re: Spondylolisthesis, lumbar region, posterior lumbar fusion.   Assessment: received [facility] pre-op call referral on 06/14/16. Pre op call completed. 56 year old with history of diabetes, asthma, degenerative arthritis. [name] reports that her FMLA forms are completed. She has completed her disability forms and short term disability is in progress now. When [facility] asked about [facility] plan, [name] was reminded that she did choose the Indemnity plan as part of her benefits. Benefits service number provided to patient for additional information of how to access.   Post procedure equipment/home health needs- [name] states she currently has a back brace that was ordered by her physician and is not sure if she will need any other equipment. RNCM reinforced that the inpatient case manager will arrange any equipment needs prior to discharge along with any home health needs  RNCM discussed [place] benefit is higher when using a [name] facility/pharmacy. However reinforced she has a choice of facility/agency.   Patient reports her husband is her support person and will be picking up her medications up from the pharmacy as well at her transportation to her follow up appointments.  Discussed Advanced directives-She has not completed, but request a packet.   No other medical issues identified and no additional community resource information needs at this time. Patient is agreeable to follow up post procedure call. Plan: telephonic RNCM will follow up within 3 business days of notification. Hospital Liaison to follow up re: Advanced Directive packet.   [name], RN, MSN, [place] Coordinator Cell: [phone]

## 2016-06-17 NOTE — Anesthesia Preprocedure Evaluation (Addendum)
Anesthesia Evaluation  Patient identified by MRN, date of birth, ID band Patient awake    Reviewed: Allergy & Precautions, NPO status , Patient's Chart, lab work & pertinent test results  Airway Mallampati: II  TM Distance: >3 FB Neck ROM: Full    Dental  (+) Teeth Intact   Pulmonary asthma , sleep apnea ,    Pulmonary exam normal        Cardiovascular negative cardio ROS Normal cardiovascular exam     Neuro/Psych  Headaches, PSYCHIATRIC DISORDERS Depression    GI/Hepatic GERD  Medicated and Controlled,  Endo/Other  diabetes, Type 2, Insulin Dependent, Oral Hypoglycemic AgentsMorbid obesity  Renal/GU      Musculoskeletal  (+) Arthritis ,   Abdominal (+) + obese,   Peds  Hematology   Anesthesia Other Findings   Reproductive/Obstetrics negative OB ROS                            Anesthesia Physical  Anesthesia Plan  ASA: III  Anesthesia Plan: General   Post-op Pain Management:    Induction: Intravenous  Airway Management Planned: Oral ETT and Video Laryngoscope Planned  Additional Equipment:   Intra-op Plan:   Post-operative Plan: Extubation in OR  Informed Consent: I have reviewed the patients History and Physical, chart, labs and discussed the procedure including the risks, benefits and alternatives for the proposed anesthesia with the patient or authorized representative who has indicated his/her understanding and acceptance.   Dental advisory given  Plan Discussed with: CRNA and Surgeon  Anesthesia Plan Comments:        Anesthesia Quick Evaluation

## 2016-06-18 ENCOUNTER — Encounter (HOSPITAL_COMMUNITY): Payer: Self-pay | Admitting: Anesthesiology

## 2016-06-18 ENCOUNTER — Encounter (HOSPITAL_COMMUNITY)
Admission: RE | Disposition: A | Discharge status: Home / Self Care | Payer: Self-pay | Source: Ambulatory Visit | Attending: Neurosurgery

## 2016-06-18 ENCOUNTER — Inpatient Hospital Stay (HOSPITAL_COMMUNITY): Payer: 59

## 2016-06-18 ENCOUNTER — Inpatient Hospital Stay (HOSPITAL_COMMUNITY)
Admission: RE | Admit: 2016-06-18 | Discharge: 2016-06-20 | DRG: 460 | Disposition: A | Discharge status: Home / Self Care | Payer: 59 | Source: Ambulatory Visit | Attending: Neurosurgery | Admitting: Neurosurgery

## 2016-06-18 ENCOUNTER — Inpatient Hospital Stay (HOSPITAL_COMMUNITY): Payer: 59 | Admitting: Anesthesiology

## 2016-06-18 ENCOUNTER — Inpatient Hospital Stay (HOSPITAL_COMMUNITY): Payer: 59 | Admitting: Emergency Medicine

## 2016-06-18 DIAGNOSIS — M1990 Unspecified osteoarthritis, unspecified site: Secondary | ICD-10-CM | POA: Diagnosis present

## 2016-06-18 DIAGNOSIS — Z9889 Other specified postprocedural states: Secondary | ICD-10-CM | POA: Diagnosis not present

## 2016-06-18 DIAGNOSIS — R400 Somnolence: Secondary | ICD-10-CM | POA: Diagnosis present

## 2016-06-18 DIAGNOSIS — E119 Type 2 diabetes mellitus without complications: Secondary | ICD-10-CM | POA: Diagnosis present

## 2016-06-18 DIAGNOSIS — M5416 Radiculopathy, lumbar region: Secondary | ICD-10-CM | POA: Diagnosis present

## 2016-06-18 DIAGNOSIS — J45909 Unspecified asthma, uncomplicated: Secondary | ICD-10-CM | POA: Diagnosis present

## 2016-06-18 DIAGNOSIS — Z87442 Personal history of urinary calculi: Secondary | ICD-10-CM

## 2016-06-18 DIAGNOSIS — Z6841 Body Mass Index (BMI) 40.0 and over, adult: Secondary | ICD-10-CM | POA: Diagnosis not present

## 2016-06-18 DIAGNOSIS — Z419 Encounter for procedure for purposes other than remedying health state, unspecified: Secondary | ICD-10-CM

## 2016-06-18 DIAGNOSIS — Z79899 Other long term (current) drug therapy: Secondary | ICD-10-CM

## 2016-06-18 DIAGNOSIS — M5126 Other intervertebral disc displacement, lumbar region: Secondary | ICD-10-CM | POA: Diagnosis not present

## 2016-06-18 DIAGNOSIS — M4316 Spondylolisthesis, lumbar region: Secondary | ICD-10-CM | POA: Diagnosis not present

## 2016-06-18 DIAGNOSIS — Z8249 Family history of ischemic heart disease and other diseases of the circulatory system: Secondary | ICD-10-CM

## 2016-06-18 DIAGNOSIS — G9520 Unspecified cord compression: Secondary | ICD-10-CM | POA: Diagnosis not present

## 2016-06-18 DIAGNOSIS — Z7951 Long term (current) use of inhaled steroids: Secondary | ICD-10-CM | POA: Diagnosis not present

## 2016-06-18 DIAGNOSIS — Z9851 Tubal ligation status: Secondary | ICD-10-CM

## 2016-06-18 DIAGNOSIS — M48061 Spinal stenosis, lumbar region without neurogenic claudication: Secondary | ICD-10-CM | POA: Diagnosis not present

## 2016-06-18 DIAGNOSIS — M48062 Spinal stenosis, lumbar region with neurogenic claudication: Secondary | ICD-10-CM | POA: Diagnosis present

## 2016-06-18 DIAGNOSIS — Z981 Arthrodesis status: Secondary | ICD-10-CM | POA: Diagnosis not present

## 2016-06-18 DIAGNOSIS — G4730 Sleep apnea, unspecified: Secondary | ICD-10-CM | POA: Diagnosis present

## 2016-06-18 DIAGNOSIS — R262 Difficulty in walking, not elsewhere classified: Secondary | ICD-10-CM

## 2016-06-18 DIAGNOSIS — N200 Calculus of kidney: Secondary | ICD-10-CM | POA: Diagnosis not present

## 2016-06-18 DIAGNOSIS — Z888 Allergy status to other drugs, medicaments and biological substances status: Secondary | ICD-10-CM

## 2016-06-18 DIAGNOSIS — E1165 Type 2 diabetes mellitus with hyperglycemia: Secondary | ICD-10-CM

## 2016-06-18 DIAGNOSIS — R3129 Other microscopic hematuria: Secondary | ICD-10-CM | POA: Diagnosis not present

## 2016-06-18 DIAGNOSIS — M4726 Other spondylosis with radiculopathy, lumbar region: Secondary | ICD-10-CM | POA: Diagnosis not present

## 2016-06-18 DIAGNOSIS — K219 Gastro-esophageal reflux disease without esophagitis: Secondary | ICD-10-CM | POA: Diagnosis present

## 2016-06-18 DIAGNOSIS — Z794 Long term (current) use of insulin: Secondary | ICD-10-CM | POA: Diagnosis not present

## 2016-06-18 LAB — GLUCOSE, CAPILLARY
GLUCOSE-CAPILLARY: 256 mg/dL — AB (ref 65–99)
GLUCOSE-CAPILLARY: 177 mg/dL — AB (ref 65–99)
Glucose-Capillary: 238 mg/dL — ABNORMAL HIGH (ref 65–99)
Glucose-Capillary: 251 mg/dL — ABNORMAL HIGH (ref 65–99)

## 2016-06-18 SURGERY — POSTERIOR LUMBAR FUSION 3 LEVEL
Anesthesia: General | Site: Spine Lumbar

## 2016-06-18 MED ORDER — GABAPENTIN 300 MG PO CAPS
300.0000 mg | ORAL_CAPSULE | Freq: Every morning | ORAL | Status: DC
  Administered 2016-06-19 – 2016-06-20 (×2): 300 mg via ORAL
  Filled 2016-06-18 (×2): qty 1

## 2016-06-18 MED ORDER — CYCLOBENZAPRINE HCL 10 MG PO TABS
10.0000 mg | ORAL_TABLET | Freq: Three times a day (TID) | ORAL | Status: DC | PRN
  Administered 2016-06-18: 10 mg via ORAL

## 2016-06-18 MED ORDER — ZOLPIDEM TARTRATE 5 MG PO TABS: 5.0000 mg | ORAL_TABLET | Freq: Every evening | ORAL | Status: DC | PRN

## 2016-06-18 MED ORDER — PROPOFOL 10 MG/ML IV BOLUS
INTRAVENOUS | Status: AC
  Filled 2016-06-18: qty 20

## 2016-06-18 MED ORDER — THROMBIN 20000 UNITS EX SOLR
CUTANEOUS | Status: DC | PRN
  Administered 2016-06-18: 20 mL via TOPICAL

## 2016-06-18 MED ORDER — BUPIVACAINE-EPINEPHRINE (PF) 0.5% -1:200000 IJ SOLN
INTRAMUSCULAR | Status: DC | PRN
  Administered 2016-06-18: 10 mL

## 2016-06-18 MED ORDER — FENTANYL CITRATE (PF) 100 MCG/2ML IJ SOLN
INTRAMUSCULAR | Status: AC
  Filled 2016-06-18: qty 2

## 2016-06-18 MED ORDER — KETOROLAC TROMETHAMINE 30 MG/ML IJ SOLN
30.0000 mg | Freq: Once | INTRAMUSCULAR | Status: AC
  Administered 2016-06-18: 30 mg via INTRAVENOUS

## 2016-06-18 MED ORDER — INSULIN ASPART 100 UNIT/ML ~~LOC~~ SOLN
0.0000 [IU] | Freq: Three times a day (TID) | SUBCUTANEOUS | Status: DC
  Administered 2016-06-19: 20 [IU] via SUBCUTANEOUS
  Administered 2016-06-19: 7 [IU] via SUBCUTANEOUS
  Administered 2016-06-19: 11 [IU] via SUBCUTANEOUS
  Administered 2016-06-19: 7 [IU] via SUBCUTANEOUS
  Administered 2016-06-20: 4 [IU] via SUBCUTANEOUS

## 2016-06-18 MED ORDER — HYDROMORPHONE HCL 1 MG/ML IJ SOLN
0.2500 mg | INTRAMUSCULAR | Status: DC | PRN
  Administered 2016-06-18: 0.5 mg via INTRAVENOUS

## 2016-06-18 MED ORDER — MORPHINE SULFATE (PF) 2 MG/ML IV SOLN: 1.0000 mg | INTRAVENOUS | Status: DC | PRN

## 2016-06-18 MED ORDER — ROCURONIUM BROMIDE 100 MG/10ML IV SOLN
INTRAVENOUS | Status: DC | PRN
  Administered 2016-06-18: 10 mg via INTRAVENOUS
  Administered 2016-06-18: 20 mg via INTRAVENOUS
  Administered 2016-06-18: 30 mg via INTRAVENOUS
  Administered 2016-06-18: 50 mg via INTRAVENOUS
  Administered 2016-06-18: 20 mg via INTRAVENOUS
  Administered 2016-06-18: 10 mg via INTRAVENOUS

## 2016-06-18 MED ORDER — GELATIN ABSORBABLE MT POWD
OROMUCOSAL | Status: DC | PRN
  Administered 2016-06-18: 5 mL via TOPICAL

## 2016-06-18 MED ORDER — OXYCODONE-ACETAMINOPHEN 5-325 MG PO TABS
ORAL_TABLET | ORAL | Status: AC
  Filled 2016-06-18: qty 2

## 2016-06-18 MED ORDER — MEPERIDINE HCL 25 MG/ML IJ SOLN: 6.2500 mg | INTRAMUSCULAR | Status: DC | PRN

## 2016-06-18 MED ORDER — DULOXETINE HCL 30 MG PO CPEP
60.0000 mg | ORAL_CAPSULE | Freq: Every day | ORAL | Status: DC
  Administered 2016-06-18 – 2016-06-19 (×2): 60 mg via ORAL
  Filled 2016-06-18 (×2): qty 2

## 2016-06-18 MED ORDER — LOSARTAN POTASSIUM 50 MG PO TABS
25.0000 mg | ORAL_TABLET | Freq: Every morning | ORAL | Status: DC
  Administered 2016-06-18 – 2016-06-20 (×2): 25 mg via ORAL
  Filled 2016-06-18 (×3): qty 1

## 2016-06-18 MED ORDER — EPHEDRINE SULFATE 50 MG/ML IJ SOLN
INTRAMUSCULAR | Status: DC | PRN
  Administered 2016-06-18 (×2): 5 mg via INTRAVENOUS

## 2016-06-18 MED ORDER — ONDANSETRON HCL 4 MG/2ML IJ SOLN: 4.0000 mg | INTRAMUSCULAR | Status: DC | PRN

## 2016-06-18 MED ORDER — MIDAZOLAM HCL 5 MG/5ML IJ SOLN
INTRAMUSCULAR | Status: DC | PRN
  Administered 2016-06-18: 2 mg via INTRAVENOUS

## 2016-06-18 MED ORDER — LIDOCAINE HCL (CARDIAC) 20 MG/ML IV SOLN
INTRAVENOUS | Status: DC | PRN
  Administered 2016-06-18: 100 mg via INTRAVENOUS

## 2016-06-18 MED ORDER — MENTHOL 3 MG MT LOZG: 1.0000 | LOZENGE | OROMUCOSAL | Status: DC | PRN

## 2016-06-18 MED ORDER — FENTANYL CITRATE (PF) 100 MCG/2ML IJ SOLN
INTRAMUSCULAR | Status: DC | PRN
  Administered 2016-06-18 (×4): 25 ug via INTRAVENOUS
  Administered 2016-06-18: 100 ug via INTRAVENOUS
  Administered 2016-06-18: 25 ug via INTRAVENOUS
  Administered 2016-06-18: 50 ug via INTRAVENOUS
  Administered 2016-06-18 (×2): 25 ug via INTRAVENOUS
  Administered 2016-06-18: 50 ug via INTRAVENOUS
  Administered 2016-06-18: 25 ug via INTRAVENOUS

## 2016-06-18 MED ORDER — ONDANSETRON HCL 4 MG/2ML IJ SOLN
INTRAMUSCULAR | Status: DC | PRN
  Administered 2016-06-18: 4 mg via INTRAVENOUS

## 2016-06-18 MED ORDER — GABAPENTIN 300 MG PO CAPS
600.0000 mg | ORAL_CAPSULE | Freq: Every day | ORAL | Status: DC
  Administered 2016-06-18 – 2016-06-19 (×2): 600 mg via ORAL
  Filled 2016-06-18 (×2): qty 2

## 2016-06-18 MED ORDER — CEFAZOLIN SODIUM-DEXTROSE 2-4 GM/100ML-% IV SOLN
2.0000 g | INTRAVENOUS | Status: AC
  Administered 2016-06-18 (×2): 2 g via INTRAVENOUS

## 2016-06-18 MED ORDER — THROMBIN 20000 UNITS EX SOLR
CUTANEOUS | Status: AC
  Filled 2016-06-18: qty 20000

## 2016-06-18 MED ORDER — VANCOMYCIN HCL 1000 MG IV SOLR
INTRAVENOUS | Status: DC | PRN
  Administered 2016-06-18: 1000 mg

## 2016-06-18 MED ORDER — ACETAMINOPHEN 650 MG RE SUPP: 650.0000 mg | RECTAL | Status: DC | PRN

## 2016-06-18 MED ORDER — BACITRACIN ZINC 500 UNIT/GM EX OINT
TOPICAL_OINTMENT | CUTANEOUS | Status: DC | PRN
  Administered 2016-06-18: 1 via TOPICAL

## 2016-06-18 MED ORDER — THROMBIN 5000 UNITS EX SOLR
CUTANEOUS | Status: AC
  Filled 2016-06-18: qty 5000

## 2016-06-18 MED ORDER — 0.9 % SODIUM CHLORIDE (POUR BTL) OPTIME
TOPICAL | Status: DC | PRN
  Administered 2016-06-18: 1000 mL

## 2016-06-18 MED ORDER — BISACODYL 10 MG RE SUPP: 10.0000 mg | Freq: Every day | RECTAL | Status: DC | PRN

## 2016-06-18 MED ORDER — MIDAZOLAM HCL 2 MG/2ML IJ SOLN
INTRAMUSCULAR | Status: AC
Start: 2016-06-18 — End: 2016-06-18
  Filled 2016-06-18: qty 2

## 2016-06-18 MED ORDER — GLYCOPYRROLATE 0.2 MG/ML IJ SOLN
INTRAMUSCULAR | Status: DC | PRN
  Administered 2016-06-18: 0.2 mg via INTRAVENOUS

## 2016-06-18 MED ORDER — CHLORHEXIDINE GLUCONATE CLOTH 2 % EX PADS: 6.0000 | MEDICATED_PAD | Freq: Once | CUTANEOUS | Status: DC

## 2016-06-18 MED ORDER — ONDANSETRON HCL 4 MG/2ML IJ SOLN
INTRAMUSCULAR | Status: AC
  Filled 2016-06-18: qty 2

## 2016-06-18 MED ORDER — CYCLOBENZAPRINE HCL 10 MG PO TABS
ORAL_TABLET | ORAL | Status: AC
  Filled 2016-06-18: qty 1

## 2016-06-18 MED ORDER — ATORVASTATIN CALCIUM 20 MG PO TABS
40.0000 mg | ORAL_TABLET | Freq: Every day | ORAL | Status: DC
  Administered 2016-06-18 – 2016-06-19 (×2): 40 mg via ORAL
  Filled 2016-06-18 (×2): qty 2

## 2016-06-18 MED ORDER — HYDROMORPHONE HCL 1 MG/ML IJ SOLN
INTRAMUSCULAR | Status: AC
  Administered 2016-06-18: 0.5 mg
  Filled 2016-06-18: qty 1

## 2016-06-18 MED ORDER — BACITRACIN ZINC 500 UNIT/GM EX OINT
TOPICAL_OINTMENT | CUTANEOUS | Status: AC
  Filled 2016-06-18: qty 28.35

## 2016-06-18 MED ORDER — EPHEDRINE 5 MG/ML INJ
INTRAVENOUS | Status: AC
  Filled 2016-06-18: qty 10

## 2016-06-18 MED ORDER — CEFAZOLIN SODIUM-DEXTROSE 2-4 GM/100ML-% IV SOLN
2.0000 g | Freq: Three times a day (TID) | INTRAVENOUS | Status: AC
  Administered 2016-06-18 – 2016-06-19 (×2): 2 g via INTRAVENOUS
  Filled 2016-06-18 (×2): qty 100

## 2016-06-18 MED ORDER — METOPROLOL SUCCINATE ER 25 MG PO TB24
25.0000 mg | ORAL_TABLET | Freq: Every morning | ORAL | Status: DC
  Administered 2016-06-19 – 2016-06-20 (×2): 25 mg via ORAL
  Filled 2016-06-18 (×2): qty 1

## 2016-06-18 MED ORDER — PANTOPRAZOLE SODIUM 40 MG PO TBEC
80.0000 mg | DELAYED_RELEASE_TABLET | Freq: Every day | ORAL | Status: DC
  Administered 2016-06-19 – 2016-06-20 (×2): 80 mg via ORAL
  Filled 2016-06-18 (×2): qty 2

## 2016-06-18 MED ORDER — ROCURONIUM BROMIDE 50 MG/5ML IV SOSY
PREFILLED_SYRINGE | INTRAVENOUS | Status: AC
  Filled 2016-06-18: qty 15

## 2016-06-18 MED ORDER — SUGAMMADEX SODIUM 200 MG/2ML IV SOLN
INTRAVENOUS | Status: AC
  Filled 2016-06-18: qty 2

## 2016-06-18 MED ORDER — FENTANYL CITRATE (PF) 100 MCG/2ML IJ SOLN
INTRAMUSCULAR | Status: AC
  Filled 2016-06-18: qty 4

## 2016-06-18 MED ORDER — BUPIVACAINE LIPOSOME 1.3 % IJ SUSP
20.0000 mL | INTRAMUSCULAR | Status: AC
  Administered 2016-06-18: 20 mL
  Filled 2016-06-18: qty 20

## 2016-06-18 MED ORDER — HYDROCODONE-ACETAMINOPHEN 5-325 MG PO TABS: 1.0000 | ORAL_TABLET | ORAL | Status: DC | PRN

## 2016-06-18 MED ORDER — LACTATED RINGERS IV SOLN
INTRAVENOUS | Status: DC | PRN
  Administered 2016-06-18 (×2): via INTRAVENOUS

## 2016-06-18 MED ORDER — METFORMIN HCL ER 500 MG PO TB24
500.0000 mg | ORAL_TABLET | Freq: Every day | ORAL | Status: DC
  Administered 2016-06-18 – 2016-06-19 (×2): 500 mg via ORAL
  Filled 2016-06-18 (×2): qty 1

## 2016-06-18 MED ORDER — BUPIVACAINE-EPINEPHRINE (PF) 0.5% -1:200000 IJ SOLN
INTRAMUSCULAR | Status: AC
  Filled 2016-06-18: qty 30

## 2016-06-18 MED ORDER — PHENYLEPHRINE HCL 10 MG/ML IJ SOLN
INTRAVENOUS | Status: DC | PRN
  Administered 2016-06-18: 60 ug/min via INTRAVENOUS

## 2016-06-18 MED ORDER — ACETAMINOPHEN 325 MG PO TABS: 650.0000 mg | ORAL_TABLET | ORAL | Status: DC | PRN

## 2016-06-18 MED ORDER — ALBUMIN HUMAN 5 % IV SOLN
INTRAVENOUS | Status: DC | PRN
  Administered 2016-06-18: 08:00:00 via INTRAVENOUS

## 2016-06-18 MED ORDER — KETOROLAC TROMETHAMINE 30 MG/ML IJ SOLN
INTRAMUSCULAR | Status: AC
  Filled 2016-06-18: qty 1

## 2016-06-18 MED ORDER — SODIUM CHLORIDE 0.9 % IV SOLN: 250.0000 mL | INTRAVENOUS | Status: DC

## 2016-06-18 MED ORDER — PROMETHAZINE HCL 25 MG/ML IJ SOLN: 6.2500 mg | INTRAMUSCULAR | Status: DC | PRN

## 2016-06-18 MED ORDER — LACTATED RINGERS IV SOLN
INTRAVENOUS | Status: DC | PRN
  Administered 2016-06-18 (×2): via INTRAVENOUS

## 2016-06-18 MED ORDER — SODIUM CHLORIDE 0.9 % IR SOLN
Status: DC | PRN
  Administered 2016-06-18: 500 mL

## 2016-06-18 MED ORDER — PROPOFOL 10 MG/ML IV BOLUS
INTRAVENOUS | Status: DC | PRN
  Administered 2016-06-18: 200 mg via INTRAVENOUS

## 2016-06-18 MED ORDER — INSULIN ASPART 100 UNIT/ML ~~LOC~~ SOLN
7.0000 [IU] | Freq: Once | SUBCUTANEOUS | Status: AC
  Administered 2016-06-18: 7 [IU] via SUBCUTANEOUS

## 2016-06-18 MED ORDER — PIOGLITAZONE HCL 30 MG PO TABS
30.0000 mg | ORAL_TABLET | Freq: Every day | ORAL | Status: DC
  Administered 2016-06-19 – 2016-06-20 (×2): 30 mg via ORAL
  Filled 2016-06-18 (×3): qty 1

## 2016-06-18 MED ORDER — PROPOFOL 10 MG/ML IV BOLUS
INTRAVENOUS | Status: AC
Start: 2016-06-18 — End: 2016-06-18
  Filled 2016-06-18: qty 20

## 2016-06-18 MED ORDER — INSULIN ASPART 100 UNIT/ML ~~LOC~~ SOLN
0.0000 [IU] | SUBCUTANEOUS | Status: DC
  Administered 2016-06-18: 11 [IU] via SUBCUTANEOUS

## 2016-06-18 MED ORDER — PHENYLEPHRINE 40 MCG/ML (10ML) SYRINGE FOR IV PUSH (FOR BLOOD PRESSURE SUPPORT)
PREFILLED_SYRINGE | INTRAVENOUS | Status: AC
  Filled 2016-06-18: qty 20

## 2016-06-18 MED ORDER — TRAMADOL HCL 50 MG PO TABS
50.0000 mg | ORAL_TABLET | Freq: Two times a day (BID) | ORAL | Status: DC | PRN
  Administered 2016-06-18: 100 mg via ORAL
  Filled 2016-06-18: qty 2

## 2016-06-18 MED ORDER — GABAPENTIN 300 MG PO CAPS: 300.0000 mg | ORAL_CAPSULE | Freq: Two times a day (BID) | ORAL | Status: DC

## 2016-06-18 MED ORDER — SODIUM CHLORIDE 0.9% FLUSH: 3.0000 mL | INTRAVENOUS | Status: DC | PRN

## 2016-06-18 MED ORDER — DOCUSATE SODIUM 100 MG PO CAPS
100.0000 mg | ORAL_CAPSULE | Freq: Two times a day (BID) | ORAL | Status: DC
  Administered 2016-06-18 – 2016-06-19 (×2): 100 mg via ORAL
  Filled 2016-06-18 (×5): qty 1

## 2016-06-18 MED ORDER — HYDROMORPHONE HCL 1 MG/ML IJ SOLN
0.5000 mg | Freq: Once | INTRAMUSCULAR | Status: AC
  Administered 2016-06-18: 0.5 mg via INTRAVENOUS

## 2016-06-18 MED ORDER — ONDANSETRON HCL 4 MG PO TABS: 4.0000 mg | ORAL_TABLET | Freq: Three times a day (TID) | ORAL | Status: DC | PRN

## 2016-06-18 MED ORDER — VANCOMYCIN HCL 1000 MG IV SOLR
INTRAVENOUS | Status: AC
  Filled 2016-06-18: qty 1000

## 2016-06-18 MED ORDER — OXYCODONE-ACETAMINOPHEN 5-325 MG PO TABS
1.0000 | ORAL_TABLET | ORAL | Status: DC | PRN
  Administered 2016-06-18: 2 via ORAL
  Administered 2016-06-18: 1 via ORAL
  Administered 2016-06-18 – 2016-06-20 (×8): 2 via ORAL
  Filled 2016-06-18 (×4): qty 2
  Filled 2016-06-18: qty 1
  Filled 2016-06-18 (×5): qty 2

## 2016-06-18 MED ORDER — SODIUM CHLORIDE 0.9% FLUSH: 3.0000 mL | Freq: Two times a day (BID) | INTRAVENOUS | Status: DC

## 2016-06-18 MED ORDER — LIDOCAINE 2% (20 MG/ML) 5 ML SYRINGE
INTRAMUSCULAR | Status: AC
  Filled 2016-06-18: qty 5

## 2016-06-18 MED ORDER — CEFAZOLIN SODIUM-DEXTROSE 2-4 GM/100ML-% IV SOLN
INTRAVENOUS | Status: AC
  Filled 2016-06-18: qty 100

## 2016-06-18 MED ORDER — MORPHINE SULFATE (PF) 4 MG/ML IV SOLN: 1.0000 mg | INTRAVENOUS | Status: DC | PRN

## 2016-06-18 MED ORDER — CEFAZOLIN SODIUM 1 G IJ SOLR
INTRAMUSCULAR | Status: AC
  Filled 2016-06-18: qty 20

## 2016-06-18 MED ORDER — PHENYLEPHRINE HCL 10 MG/ML IJ SOLN
INTRAMUSCULAR | Status: DC | PRN
  Administered 2016-06-18 (×2): 120 ug via INTRAVENOUS
  Administered 2016-06-18: 80 ug via INTRAVENOUS
  Administered 2016-06-18 (×2): 120 ug via INTRAVENOUS

## 2016-06-18 MED ORDER — SUGAMMADEX SODIUM 200 MG/2ML IV SOLN
INTRAVENOUS | Status: DC | PRN
  Administered 2016-06-18: 200 mg via INTRAVENOUS

## 2016-06-18 MED ORDER — INSULIN ASPART 100 UNIT/ML ~~LOC~~ SOLN
SUBCUTANEOUS | Status: AC
  Administered 2016-06-18: 4 [IU]
  Filled 2016-06-18: qty 1

## 2016-06-18 MED ORDER — PHENOL 1.4 % MT LIQD: 1.0000 | OROMUCOSAL | Status: DC | PRN

## 2016-06-18 SURGICAL SUPPLY — 76 items
BAG DECANTER FOR FLEXI CONT (MISCELLANEOUS) ×2 IMPLANT
BASKET BONE COLLECTION (BASKET) ×2 IMPLANT
BENZOIN TINCTURE PRP APPL 2/3 (GAUZE/BANDAGES/DRESSINGS) ×2 IMPLANT
BLADE CLIPPER SURG (BLADE) IMPLANT
BUR MATCHSTICK NEURO 3.0 LAGG (BURR) ×2 IMPLANT
BUR PRECISION FLUTE 6.0 (BURR) ×2 IMPLANT
CAGE ALTERA 10X31X9-13 15D (Cage) ×4 IMPLANT
CANISTER SUCT 3000ML PPV (MISCELLANEOUS) ×2 IMPLANT
CAP REVERE LOCKING (Cap) ×14 IMPLANT
CARTRIDGE OIL MAESTRO DRILL (MISCELLANEOUS) ×1 IMPLANT
CONN CROSSLINK REV 38-50MM (Connector) ×2 IMPLANT
CONNECTOR CRSLINK REV 38-50MM (Connector) ×1 IMPLANT
CONT SPEC 4OZ CLIKSEAL STRL BL (MISCELLANEOUS) ×6 IMPLANT
COVER BACK TABLE 60X90IN (DRAPES) IMPLANT
COVER TABLE BACK 60X90 (DRAPES) ×2 IMPLANT
DIFFUSER DRILL AIR PNEUMATIC (MISCELLANEOUS) ×2 IMPLANT
DRAPE C-ARM 42X72 X-RAY (DRAPES) ×4 IMPLANT
DRAPE HALF SHEET 40X57 (DRAPES) ×4 IMPLANT
DRAPE LAPAROTOMY 100X72X124 (DRAPES) ×2 IMPLANT
DRAPE POUCH INSTRU U-SHP 10X18 (DRAPES) ×2 IMPLANT
DRAPE SURG 17X23 STRL (DRAPES) ×8 IMPLANT
ELECT BLADE 4.0 EZ CLEAN MEGAD (MISCELLANEOUS) ×2
ELECT REM PT RETURN 9FT ADLT (ELECTROSURGICAL) ×2
ELECTRODE BLDE 4.0 EZ CLN MEGD (MISCELLANEOUS) ×1 IMPLANT
ELECTRODE REM PT RTRN 9FT ADLT (ELECTROSURGICAL) ×1 IMPLANT
GAUZE SPONGE 4X4 12PLY STRL (GAUZE/BANDAGES/DRESSINGS) ×2 IMPLANT
GAUZE SPONGE 4X4 16PLY XRAY LF (GAUZE/BANDAGES/DRESSINGS) ×2 IMPLANT
GLOVE BIO SURGEON STRL SZ8 (GLOVE) ×4 IMPLANT
GLOVE BIO SURGEON STRL SZ8.5 (GLOVE) ×4 IMPLANT
GLOVE BIOGEL PI IND STRL 6.5 (GLOVE) ×2 IMPLANT
GLOVE BIOGEL PI IND STRL 7.5 (GLOVE) ×1 IMPLANT
GLOVE BIOGEL PI INDICATOR 6.5 (GLOVE) ×2
GLOVE BIOGEL PI INDICATOR 7.5 (GLOVE) ×1
GLOVE ECLIPSE 9.0 STRL (GLOVE) ×2 IMPLANT
GLOVE EXAM NITRILE LRG STRL (GLOVE) IMPLANT
GLOVE EXAM NITRILE XL STR (GLOVE) IMPLANT
GLOVE EXAM NITRILE XS STR PU (GLOVE) IMPLANT
GLOVE SURG SS PI 6.5 STRL IVOR (GLOVE) ×6 IMPLANT
GLOVE SURG SS PI 7.0 STRL IVOR (GLOVE) ×2 IMPLANT
GOWN STRL REUS W/ TWL LRG LVL3 (GOWN DISPOSABLE) ×3 IMPLANT
GOWN STRL REUS W/ TWL XL LVL3 (GOWN DISPOSABLE) ×3 IMPLANT
GOWN STRL REUS W/TWL 2XL LVL3 (GOWN DISPOSABLE) IMPLANT
GOWN STRL REUS W/TWL LRG LVL3 (GOWN DISPOSABLE) ×3
GOWN STRL REUS W/TWL XL LVL3 (GOWN DISPOSABLE) ×3
HEMOSTAT POWDER KIT SURGIFOAM (HEMOSTASIS) ×2 IMPLANT
KIT BASIN OR (CUSTOM PROCEDURE TRAY) ×2 IMPLANT
KIT ROOM TURNOVER OR (KITS) ×2 IMPLANT
MILL MEDIUM DISP (BLADE) ×2 IMPLANT
NEEDLE HYPO 21X1.5 SAFETY (NEEDLE) ×2 IMPLANT
NEEDLE HYPO 22GX1.5 SAFETY (NEEDLE) ×2 IMPLANT
NS IRRIG 1000ML POUR BTL (IV SOLUTION) ×2 IMPLANT
OIL CARTRIDGE MAESTRO DRILL (MISCELLANEOUS) ×2
PACK LAMINECTOMY NEURO (CUSTOM PROCEDURE TRAY) ×2 IMPLANT
PAD ARMBOARD 7.5X6 YLW CONV (MISCELLANEOUS) ×10 IMPLANT
PATTIES SURGICAL .5 X.5 (GAUZE/BANDAGES/DRESSINGS) ×2 IMPLANT
PATTIES SURGICAL .5 X1 (DISPOSABLE) IMPLANT
PATTIES SURGICAL 1X1 (DISPOSABLE) ×2 IMPLANT
ROD REVERE 7.5MM (Rod) ×4 IMPLANT
SCREW 7.5X45MM (Screw) ×2 IMPLANT
SCREW 7.5X50MM (Screw) ×10 IMPLANT
SPONGE GAUZE 4X4 12PLY STER LF (GAUZE/BANDAGES/DRESSINGS) ×2 IMPLANT
SPONGE LAP 4X18 X RAY DECT (DISPOSABLE) IMPLANT
SPONGE NEURO XRAY DETECT 1X3 (DISPOSABLE) IMPLANT
SPONGE SURGIFOAM ABS GEL 100 (HEMOSTASIS) ×2 IMPLANT
STRIP BIOACTIVE 10CC 25X50X8 (Miscellaneous) ×2 IMPLANT
STRIP BIOACTIVE 20CC 25X100X8 (Miscellaneous) ×2 IMPLANT
STRIP CLOSURE SKIN 1/2X4 (GAUZE/BANDAGES/DRESSINGS) ×2 IMPLANT
SUT VIC AB 1 CT1 18XBRD ANBCTR (SUTURE) ×3 IMPLANT
SUT VIC AB 1 CT1 8-18 (SUTURE) ×3
SUT VIC AB 2-0 CP2 18 (SUTURE) ×4 IMPLANT
SYR 20CC LL (SYRINGE) ×2 IMPLANT
TAPE CLOTH SURG 4X10 WHT LF (GAUZE/BANDAGES/DRESSINGS) ×2 IMPLANT
TOWEL OR 17X24 6PK STRL BLUE (TOWEL DISPOSABLE) ×2 IMPLANT
TOWEL OR 17X26 10 PK STRL BLUE (TOWEL DISPOSABLE) ×2 IMPLANT
TRAY FOLEY W/METER SILVER 16FR (SET/KITS/TRAYS/PACK) ×2 IMPLANT
WATER STERILE IRR 1000ML POUR (IV SOLUTION) ×2 IMPLANT

## 2016-06-18 NOTE — Anesthesia Procedure Notes (Signed)
Procedure Name: Intubation Date/Time: 06/18/2016 7:44 AM Performed by: Shirlyn Goltz Pre-anesthesia Checklist: Patient identified, Emergency Drugs available, Suction available and Patient being monitored Patient Re-evaluated:Patient Re-evaluated prior to inductionOxygen Delivery Method: Circle system utilized Preoxygenation: Pre-oxygenation with 100% oxygen Intubation Type: IV induction Ventilation: Mask ventilation without difficulty Laryngoscope Size: Glidescope and 3 Grade View: Grade I Tube type: Oral Tube size: 7.5 mm Number of attempts: 1 Airway Equipment and Method: Video-laryngoscopy and Rigid stylet Placement Confirmation: ETT inserted through vocal cords under direct vision,  positive ETCO2 and breath sounds checked- equal and bilateral Secured at: 22 cm Tube secured with: Tape Dental Injury: Teeth and Oropharynx as per pre-operative assessment

## 2016-06-18 NOTE — Anesthesia Postprocedure Evaluation (Addendum)
Anesthesia Post Note  Patient: [name]  Procedure(s) Performed: Procedure(s) (LRB): POSTERIOR LUMBAR INTERBODY FUSION, INTERBODY PROSTHESIS,POSTERIOR LATERAL ARTHRODESIS,POSTERIOR SEGMENTAL UNSTRUMENTATION LUMBAR THREE-FOUR,LUMBAR FOUR-FIVE, LUMBAR TWO-THREE LAMINOTOMIES. (N/A)  Patient location during evaluation: PACU Anesthesia Type: General Level of consciousness: awake Pain management: pain level controlled Vital Signs Assessment: post-procedure vital signs reviewed and stable Respiratory status: spontaneous breathing Cardiovascular status: stable Postop Assessment: no signs of nausea or vomiting        Last Vitals:  Vitals:   06/18/16 1310 06/18/16 1325  BP: 116/64 97/61  Pulse: (!) 101 98  Resp: (!) 7 16  Temp:      Last Pain:  Vitals:   06/18/16 1255  TempSrc:   PainSc: 6    Pain Goal: Patients Stated Pain Goal: 4 (06/18/16 1255)               [place]

## 2016-06-18 NOTE — Transfer of Care (Signed)
Immediate Anesthesia Transfer of Care Note  Patient: [name]  Procedure(s) Performed: Procedure(s): POSTERIOR LUMBAR INTERBODY FUSION, INTERBODY PROSTHESIS,POSTERIOR LATERAL ARTHRODESIS,POSTERIOR SEGMENTAL UNSTRUMENTATION LUMBAR THREE-FOUR,LUMBAR FOUR-FIVE, LUMBAR TWO-THREE LAMINOTOMIES. (N/A)  Patient Location: PACU  Anesthesia Type:General  Level of Consciousness: awake, alert , oriented and patient cooperative  Airway & Oxygen Therapy: Patient Spontanous Breathing and Patient connected to face mask oxygen  Post-op Assessment: Report given to RN and Post -op Vital signs reviewed and stable  Post vital signs: Reviewed and stable  Last Vitals:  Vitals:   06/18/16 0620  BP: (!) 153/86  Pulse: 84  Resp: 20  Temp: 36.9 C    Last Pain:  Vitals:   06/18/16 0620  TempSrc: Oral      Patients Stated Pain Goal: 2 ([id number])  Complications: No apparent anesthesia complications

## 2016-06-18 NOTE — Op Note (Signed)
Brief history: The patient is a 56 year old white female who has complained of back, buttock, and leg pain consistent with neurogenic claudication. She has failed medical management and was worked up with a lumbar myelo CT. This demonstrated the patient had an L3-4 and L4-5 spondylolysis is with spinal stenosis at L2-3, L3-4 and L4-5. I discussed the various treatment options with the patient including surgery. She has weighed the risks, benefits, and alternatives surgery and decided to proceed with a lumbar decompression, instrumentation, and fusion.  Preoperative diagnosis: L3-4 and L4-5 spondylolisthesis, facet arthropathy, L2-3, L3-4 and L4-5 spinal stenosis compressing  L3, L4 and L5 nerve roots nerve roots; lumbago; lumbar radiculopathy  Postoperative diagnosis: The same  Procedure: Bilateral L2-3 Laminotomy/foraminotomies, L3 and L4 laminectomy to decompress the bilateral L3, L4 and L5 nerve roots(the work required to do this was in addition to the work required to do the posterior lumbar interbody fusion because of the patient's spinal stenosis, facet arthropathy. Etc. requiring a wide decompression of the nerve roots.); L3-4 and L4-5 transforaminal lumbar interbody fusion with local morselized autograft bone and Kinnex graft extender; insertion of interbody prosthesis at L3-4 and L4-5 (globus peek expandable interbody prosthesis); posterior segmental instrumentation from L3 to L5 with globus titanium pedicle screws and rods; posterior lateral arthrodesis at L3-4 and L4-5 with local morselized autograft bone and Kinnex bone graft extender.  Surgeon: Dr. Earle Gell  Asst.: Dr. Annette Stable  Anesthesia: Gen. endotracheal  Estimated blood loss: 250 mL  Drains: None  Complications: None  Description of procedure: The patient was brought to the operating room by the anesthesia team. General endotracheal anesthesia was induced. The patient was turned to the prone position on the Wilson frame. The  patient's lumbosacral region was then prepared with Betadine scrub and Betadine solution. Sterile drapes were applied.  I then injected the area to be incised with Marcaine with epinephrine solution. I then used the scalpel to make a linear midline incision over the L2-3, L3-4 and L4-5 interspace. I then used electrocautery to perform a bilateral subperiosteal dissection exposing the spinous process and lamina of L2, L3, L4 and L5. We then obtained intraoperative radiograph to confirm our location. We then inserted the Verstrac retractor to provide exposure.  I began the decompression by using the high speed drill to perform laminotomies at L2-3, L3-4 and L4-5 bilaterally. We then used the Kerrison punches to complete the L3 and L4 laminectomy and to widen the bilateral laminotomies/foraminotomies at L2-3 and removed the ligamentum flavum at L2-3, L3-4 and L4-5. We used the Kerrison punches to remove the medial facets at L2-3, L3-4 and L4-5. We performed wide foraminotomies about the bilateral L3, L4 and L5 nerve roots completing the decompression.  We now turned our attention to the posterior lumbar interbody fusion. I used a scalpel to incise the intervertebral disc at L3-4 and L4-5 bilaterally. I then performed a partial intervertebral discectomy at L3-4 and L4-5 bilaterally using the pituitary forceps. We prepared the vertebral endplates at [id number] and [id number] bilaterally for the fusion by removing the soft tissues with the curettes. We then used the trial spacers to pick the appropriate sized interbody prosthesis. We prefilled his prosthesis with a combination of local morselized autograft bone that we obtained during the decompression as well as Kinnex bone graft extender. We inserted the prefilled prosthesis into the interspace at L3-4 and L4-5, we then expanded the prosthesis.. There was a good snug fit of the prosthesis in the interspace. We then filled and the remainder  of the intervertebral disc space  with local morselized autograft bone and Kinnex. This completed the posterior lumbar interbody arthrodesis.  We now turned attention to the instrumentation. Under fluoroscopic guidance we cannulated the bilateral L3-L4 and L5 pedicles with the bone probe. We then removed the bone probe. We then tapped the pedicle with a 6.5 millimeter tap. We then removed the tap. We probed inside the tapped pedicle with a ball probe to rule out cortical breaches. We then inserted a 7.5 x 45 and 50 millimeter pedicle screw into the L3, L4 and L5 pedicles bilaterally under fluoroscopic guidance. We then palpated along the medial aspect of the pedicles to rule out cortical breaches. There were none. The nerve roots were not injured. We then connected the unilateral pedicle screws with a lordotic rod. We compressed the construct and secured the rod in place with the caps. We then tightened the caps appropriately. We placed a cross connector between the rods. This completed the instrumentation from L3-L5.  We now turned our attention to the posterior lateral arthrodesis at L3-4 and L4-5. We used the high-speed drill to decorticate the remainder of the facets, pars, transverse process at L3-4 and L4-5. We then applied a combination of local morselized autograft bone and Kinnex bone graft extender over these decorticated posterior lateral structures. This completed the posterior lateral arthrodesis.  We then obtained hemostasis using bipolar electrocautery. We irrigated the wound out with bacitracin solution. We inspected the thecal sac and nerve roots and noted they were well decompressed. We then removed the retractor. We placed vancomycin powder in the wound. We reapproximated patient's thoracolumbar fascia with interrupted #1 Vicryl suture. We reapproximated patient's subcutaneous tissue with interrupted 2-0 Vicryl suture. The reapproximated patient's skin with Steri-Strips and benzoin. The wound was then coated with bacitracin  ointment. A sterile dressing was applied. The drapes were removed. The patient was subsequently returned to the supine position where they were extubated by the anesthesia team. He was then transported to the post anesthesia care unit in stable condition. All sponge instrument and needle counts were reportedly correct at the end of this case.

## 2016-06-18 NOTE — Progress Notes (Signed)
Patient ID: [name], female   DOB: 28-Aug-1960, 56 y.o.   MRN: [id number] Subjective:  The patient is somnolent but easily arousable. She is in no apparent distress. She looks well.  Objective: Vital signs in last 24 hours: Temp:  [97.2 F (36.2 C)-98.4 F (36.9 C)] 97.2 F (36.2 C) (01/29 1255) Pulse Rate:  [84-101] 98 (01/29 1325) Resp:  [7-20] 16 (01/29 1325) BP: (97-153)/(61-86) 97/61 (01/29 1325) SpO2:  [94 %-100 %] 98 % (01/29 1325) Weight:  [105.7 kg (233 lb)] 105.7 kg (233 lb) (01/29 0620)  Intake/Output from previous day: No intake/output data recorded. Intake/Output this shift: Total I/O In: 3250 [I.V.:3000; IV Piggyback:250] Out: 365 [Urine:125; Blood:240]  Physical exam the patient is somnolent but arousable. She is moving her lower extremities well.  Lab Results: No results for input(s): WBC, HGB, HCT, PLT in the last 72 hours. BMET No results for input(s): NA, K, CL, CO2, GLUCOSE, BUN, CREATININE, CALCIUM in the last 72 hours.  Studies/Results: Dg Lumbar Spine 2-3 Views  Result Date: 06/18/2016 CLINICAL DATA:  L3-5 lumbar fusion. EXAM: LUMBAR SPINE - 2-3 VIEW; DG C-ARM 61-120 MIN COMPARISON:  CT lumbar spine 03/29/2016 and lumbar spine series [id number]. FINDINGS: AP and lateral intraoperative fluoroscopic spot views of the lumbar spine show pedicle screws at L3, L4 and L5, per report, with interbody cages. IMPRESSION: Intraoperative visualization for L3-5 lumbar fusion. Electronically Signed   By: Lorin Picket M.D.   On: 06/18/2016 12:11   Dg C-arm 61-120 Min  Result Date: 06/18/2016 CLINICAL DATA:  L3-5 lumbar fusion. EXAM: LUMBAR SPINE - 2-3 VIEW; DG C-ARM 61-120 MIN COMPARISON:  CT lumbar spine 03/29/2016 and lumbar spine series [id number]. FINDINGS: AP and lateral intraoperative fluoroscopic spot views of the lumbar spine show pedicle screws at L3, L4 and L5, per report, with interbody cages. IMPRESSION: Intraoperative visualization for L3-5 lumbar fusion.  Electronically Signed   By: Lorin Picket M.D.   On: 06/18/2016 12:11    Assessment/Plan: The patient is doing well. I spoke with her husband.  LOS: 0 days     Kyon Bentler D 06/18/2016, 2:00 PM

## 2016-06-18 NOTE — H&P (Signed)
Subjective: The patient is a 56 year old white female who has complained of back , buttock, and leg pain consistent with neurogenic claudication. She has failed medical management. She was worked up with a myelo CT scan which demonstrated L2-3, L3-4 and L4-5 spinal stenosis with spondylolisthesis. I discussed the various treatment options with the patient. She has weighed the risks, benefits, and alternatives to surgery decided proceed with a lumbar decompression, instrumentation, and fusion.  Past Medical History:  Diagnosis Date  . Arthritis   . Asthma without status asthmaticus 08/02/2015   Overview:  mild, not requiring treatment   . Cervical spinal stenosis   . Degeneration of intervertebral disc of cervical region 04/28/2015  . Depression   . Diabetes ([place])   . GERD (gastroesophageal reflux disease)   . Headache, migraine 08/02/2015  . Heartburn   . History of kidney stones   . HLD (hyperlipidemia)   . Kidney stones 07/18/2015  . Lumbar stenosis   . Pancreatitis   . Radiculopathy of cervical region   . Sleep apnea    does not wear cpap     Past Surgical History:  Procedure Laterality Date  . ANTERIOR CERVICAL DECOMP/DISCECTOMY FUSION N/A 04/20/2016   Procedure: CERVICAL THREE-FOUR  ANTERIOR CERVICAL DECOMPRESSION/DISCECTOMY/FUSION WITH PARTIAL REMOVAL OF PLATE AT CERVICAL FOUR;  Surgeon: Leeroy Cha, MD;  Location: [facility];  Service: Neurosurgery;  Laterality: N/A;  . [place]  . CERVICAL FUSION  2009  . COLONOSCOPY    . CYSTOSCOPY WITH STENT PLACEMENT Right 08/24/2015   Procedure: CYSTOSCOPY WITH STENT PLACEMENT;  Surgeon: Cleon Gustin, MD;  Location: [facility];  Service: Urology;  Laterality: Right;  . NEPHROLITHOTOMY Right 08/24/2015   Procedure: NEPHROLITHOTOMY PERCUTANEOUS WITH SURGEON TO GET ACCESS;  Surgeon: Cleon Gustin, MD;  Location: [facility];  Service: Urology;  Laterality: Right;  . TUBAL LIGATION      Allergies  Allergen Reactions  .  Biaxin [Clarithromycin] Nausea And Vomiting and Nausea Only    Social History  Substance Use Topics  . Smoking status: Never Smoker  . Smokeless tobacco: Never Used  . Alcohol use 0.0 oz/week     Comment: occasional    Family History  Problem Relation Age of Onset  . Heart failure Mother   . Heart attack Father   . Kidney disease Neg Hx   . Bladder Cancer Neg Hx    Prior to Admission medications   Medication Sig Start Date End Date Taking? Authorizing Provider  acetaminophen (TYLENOL) 500 MG tablet Take 1,000 mg by mouth 2 (two) times daily.    Yes Historical Provider, MD  atorvastatin (LIPITOR) 40 MG tablet Take 40 mg by mouth daily at 6 PM.   Yes Historical Provider, MD  DULoxetine (CYMBALTA) 60 MG capsule Take 60 mg by mouth at bedtime.  05/31/15  Yes Historical Provider, MD  gabapentin (NEURONTIN) 300 MG capsule Take 300-600 mg by mouth 2 (two) times daily. 300 mg in the morning and 600 mg at night 06/01/15  Yes Historical Provider, MD  insulin aspart (NOVOLOG) 100 UNIT/ML FlexPen Inject 25 Units into the skin 3 (three) times daily before meals. 03/19/16 03/19/17 Yes Historical Provider, MD  Insulin Glargine-Lixisenatide 100-33 UNT-MCG/ML SOPN Inject 60 Units into the skin daily. 03/19/16  Yes Historical Provider, MD  losartan (COZAAR) 25 MG tablet Take 25 mg by mouth every morning.  05/31/15  Yes Historical Provider, MD  metFORMIN (GLUCOPHAGE-XR) 500 MG 24 hr tablet Take 500 mg by mouth at  bedtime.  05/31/15  Yes Historical Provider, MD  metoprolol succinate (TOPROL-XL) 25 MG 24 hr tablet Take 25 mg by mouth every morning.  05/31/15  Yes Historical Provider, MD  nabumetone (RELAFEN) 750 MG tablet Take 750 mg by mouth 2 (two) times daily. Will stop prior  to procedure on 06-10-2016 05/31/15  Yes Historical Provider, MD  omeprazole (PRILOSEC) 40 MG capsule Take 40 mg by mouth 2 (two) times daily.  06/01/15  Yes Historical Provider, MD  ondansetron (ZOFRAN) 4 MG tablet Take 1 tablet (4 mg  total) by mouth every 8 (eight) hours as needed for nausea or vomiting. 08/25/15  Yes Cleon Gustin, MD  oxyCODONE-acetaminophen (PERCOCET/ROXICET) 5-325 MG tablet Take 1-2 tablets by mouth every 4 (four) hours as needed for moderate pain. 04/21/16  Yes Kevan Ny Ditty, MD  pioglitazone (ACTOS) 30 MG tablet Take 30 mg by mouth daily. 11/25/15 11/24/16 Yes Historical Provider, MD  Pseudoephedrine HCl (SUDAFED 12 HOUR PO) Take 1 tablet by mouth daily as needed (sinus congestion). Sinus headache    Yes Historical Provider, MD  traMADol (ULTRAM) 50 MG tablet Take 50-100 mg by mouth 2 (two) times daily as needed for moderate pain (depends on pain level if 50-100 mg).  06/23/15  Yes Historical Provider, MD  cyclobenzaprine (FLEXERIL) 10 MG tablet Take 1 tablet (10 mg total) by mouth 3 (three) times daily as needed for muscle spasms. Patient not taking: Reported on 06/08/2016 04/21/16   Kevan Ny Ditty, MD  UNIFINE PENTIPS 31G X 5 MM MISC  05/26/15   Historical Provider, MD  zolpidem (AMBIEN) 5 MG tablet Take 5 mg by mouth at bedtime as needed for sleep. Reported on 08/30/2015    Historical Provider, MD     Review of Systems  Positive ROS: As above  All other systems have been reviewed and were otherwise negative with the exception of those mentioned in the HPI and as above.  Objective: Vital signs in last 24 hours: Temp:  [98.4 F (36.9 C)] 98.4 F (36.9 C) (01/29 0620) Pulse Rate:  [84] 84 (01/29 0620) Resp:  [20] 20 (01/29 0620) BP: (153)/(86) 153/86 (01/29 0620) SpO2:  [94 %] 94 % (01/29 0620) Weight:  [105.7 kg (233 lb)] 105.7 kg (233 lb) (01/29 0620)  General Appearance: Alert Head: Normocephalic, without obvious abnormality, atraumatic Eyes: PERRL, conjunctiva/corneas clear, EOM's intact,    Ears: Normal  Throat: Normal  Neck: Supple, Back: unremarkable Lungs: Clear to auscultation bilaterally, respirations unlabored Heart: Regular rate and rhythm, no murmur, rub or  gallop Abdomen: Soft, non-tender Extremities: Extremities normal, atraumatic, no cyanosis or edema Skin: unremarkable  NEUROLOGIC:   Mental status: alert and oriented,Motor Exam - grossly normal Sensory Exam - grossly normal Reflexes:  Coordination - grossly normal Gait - grossly normal Balance - grossly normal Cranial Nerves: I: smell Not tested  II: visual acuity  OS: Normal  OD: Normal   II: visual fields Full to confrontation  II: pupils Equal, round, reactive to light  III,VII: ptosis None  III,IV,VI: extraocular muscles  Full ROM  V: mastication Normal  V: facial light touch sensation  Normal  V,VII: corneal reflex  Present  VII: facial muscle function - upper  Normal  VII: facial muscle function - lower Normal  VIII: hearing Not tested  IX: soft palate elevation  Normal  IX,X: gag reflex Present  XI: trapezius strength  5/5  XI: sternocleidomastoid strength 5/5  XI: neck flexion strength  5/5  XII: tongue strength  Normal  Data Review Lab Results  Component Value Date   WBC 8.1 06/12/2016   HGB 11.4 (L) 06/12/2016   HCT 36.8 06/12/2016   MCV 89.1 06/12/2016   PLT 369 06/12/2016   Lab Results  Component Value Date   NA 139 06/12/2016   K 4.0 06/12/2016   CL 108 06/12/2016   CO2 26 06/12/2016   BUN 15 06/12/2016   CREATININE 0.86 06/12/2016   GLUCOSE 164 (H) 06/12/2016   No results found for: INR, PROTIME  Assessment/Plan: L2-3, L3-4 and L4-5 spinal stenosis, spondylolisthesis, lumbago, lumbar radiculopathy, neurogenic claudication: I have discussed the situation with the patient. I have reviewed her imaging studies with her and pointed out the abnormalities. We have discussed the various treatment options including surgery. I have described the surgical treatment option of an L2-3, L3-4 and L4-5 decompression with fusion and instrumentation at L3-4 and L4-5. I have shown her surgical models. We have discussed the risks, benefits, alternatives, expected  postop course, and likelihood of achieving goals with surgery. I have answered patient's questions. She has decided proceed with surgery.   Silvestre Mines D 06/18/2016 7:23 AM

## 2016-06-18 NOTE — Progress Notes (Signed)
Orthopedic Tech Progress Note Patient Details:  [name] Jan 18, 1961 [id number]  Patient ID: [name], female   DOB: 07-16-1960, 56 y.o.   MRN: [id number]   [name] 06/18/2016, 1:50 PMCalled [employer] for Lumbar brace.

## 2016-06-19 LAB — GLUCOSE, CAPILLARY
GLUCOSE-CAPILLARY: 341 mg/dL — AB (ref 65–99)
GLUCOSE-CAPILLARY: 282 mg/dL — AB (ref 65–99)
Glucose-Capillary: 408 mg/dL — ABNORMAL HIGH (ref 65–99)
Glucose-Capillary: 243 mg/dL — ABNORMAL HIGH (ref 65–99)
Glucose-Capillary: 218 mg/dL — ABNORMAL HIGH (ref 65–99)

## 2016-06-19 LAB — CBC
HEMATOCRIT: 31.1 % — AB (ref 36.0–46.0)
Hemoglobin: 9.6 g/dL — ABNORMAL LOW (ref 12.0–15.0)
MCH: 27.9 pg (ref 26.0–34.0)
MCHC: 30.9 g/dL (ref 30.0–36.0)
MCV: 90.4 fL (ref 78.0–100.0)
PLATELETS: 327 10*3/uL (ref 150–400)
RBC: 3.44 MIL/uL — ABNORMAL LOW (ref 3.87–5.11)
RDW: 14.5 % (ref 11.5–15.5)
WBC: 13.7 10*3/uL — AB (ref 4.0–10.5)

## 2016-06-19 LAB — BASIC METABOLIC PANEL
ANION GAP: 7 (ref 5–15)
BUN: 19 mg/dL (ref 6–20)
CALCIUM: 8.5 mg/dL — AB (ref 8.9–10.3)
CO2: 28 mmol/L (ref 22–32)
Chloride: 101 mmol/L (ref 101–111)
Creatinine, Ser: 0.78 mg/dL (ref 0.44–1.00)
GFR calc Af Amer: >60 mL/min (ref >60)
Glucose, Bld: 278 mg/dL — ABNORMAL HIGH (ref 65–99)
Potassium: 4.2 mmol/L (ref 3.5–5.1)
Sodium: 136 mmol/L (ref 135–145)

## 2016-06-19 LAB — HEMOGLOBIN A1C
HEMOGLOBIN A1C: 10.2 % — AB (ref 4.8–5.6)
Mean Plasma Glucose: 246 mg/dL

## 2016-06-19 MED ORDER — INSULIN GLARGINE 100 UNIT/ML ~~LOC~~ SOLN
60.0000 [IU] | Freq: Every day | SUBCUTANEOUS | Status: DC
  Administered 2016-06-19 – 2016-06-20 (×2): 60 [IU] via SUBCUTANEOUS
  Filled 2016-06-19 (×2): qty 0.6

## 2016-06-19 MED ORDER — LIVING WELL WITH DIABETES BOOK
Freq: Once | Status: AC
  Administered 2016-06-19: 11:00:00
  Filled 2016-06-19: qty 1

## 2016-06-19 MED FILL — Sodium Chloride IV Soln 0.9%: INTRAVENOUS | Qty: 1000 | Status: AC

## 2016-06-19 MED FILL — Heparin Sodium (Porcine) Inj 1000 Unit/ML: INTRAMUSCULAR | Qty: 30 | Status: AC

## 2016-06-19 NOTE — Consult Note (Signed)
   [facility] CM Inpatient Consult   06/19/2016  [name] October 28, 1960 [id number]    Went to bedside to speak with [name] on behalf of [facility] Care Management program for [facility] employees/dependents with [facility] insurance. She was sleeping but easily arousable. Made her aware that writer was at bedside to follow up with her after her pre-hospital telephone conversation with [facility] RNCM. Per conversation with [facility] RNCM prior to hospitalization, it was requested that writer drop of Advance Directives packet. Provided Advance Directives packet as well as [employer] for DM management as well as contact information. Husband was at bedside. Made them both aware that [name] will receive post discharge call. Confirmed best contact number as [phone]. Appreciative of visit.   Will request to be assigned for post discharge call.   Marthenia Rolling, MSN-Ed, RN,BSN [facility] Liaison [phone]

## 2016-06-19 NOTE — Progress Notes (Signed)
Patient ID: [name], female   DOB: Apr 30, 1961, 56 y.o.   MRN: [id number] Subjective:  The patient is somnolent but arousable. She was recently given Percocet.  Objective: Vital signs in last 24 hours: Temp:  [97.2 F (36.2 C)-100 F (37.8 C)] 98 F (36.7 C) (01/30 0757) Pulse Rate:  [90-118] 117 (01/30 0757) Resp:  [7-20] 20 (01/30 0757) BP: (92-144)/(48-67) 110/52 (01/30 0757) SpO2:  [91 %-100 %] 91 % (01/30 0757)  Intake/Output from previous day: 01/29 0701 - 01/30 0700 In: 3870 [P.O.:420; I.V.:3000; IV Piggyback:250] Out: [id number] [Urine:1275; Blood:240] Intake/Output this shift: No intake/output data recorded.  Physical exam the patient is, but arousable. She is moving her lower extremities well.  Lab Results:  Recent Labs  06/19/16 0352  WBC 13.7*  HGB 9.6*  HCT 31.1*  PLT 327   BMET  Recent Labs  06/19/16 0352  NA 136  K 4.2  CL 101  CO2 28  GLUCOSE 278*  BUN 19  CREATININE 0.78  CALCIUM 8.5*    Studies/Results: Dg Lumbar Spine 2-3 Views  Result Date: 06/18/2016 CLINICAL DATA:  L3-5 lumbar fusion. EXAM: LUMBAR SPINE - 2-3 VIEW; DG C-ARM 61-120 MIN COMPARISON:  CT lumbar spine 03/29/2016 and lumbar spine series [id number]. FINDINGS: AP and lateral intraoperative fluoroscopic spot views of the lumbar spine show pedicle screws at L3, L4 and L5, per report, with interbody cages. IMPRESSION: Intraoperative visualization for L3-5 lumbar fusion. Electronically Signed   By: Lorin Picket M.D.   On: 06/18/2016 12:11   Dg Lumbar Spine 1 View  Result Date: 06/18/2016 CLINICAL DATA:  Operative localization, lumbar spine surgery EXAM: LUMBAR SPINE - 1 VIEW COMPARISON:  03/29/2016 head FINDINGS: Portable cross-table lateral view demonstrates surgical retractors and a radiopaque localizer posterior to L3-4. Diffuse endplate lumbar spondylosis noted. Facet arthropathy most pronounced at L4-5 and L5-S1. IMPRESSION: Operative localizer L3-4. Electronically Signed   By: Jerilynn Mages.   Shick M.D.   On: 06/18/2016 17:16   Dg C-arm 61-120 Min  Result Date: 06/18/2016 CLINICAL DATA:  L3-5 lumbar fusion. EXAM: LUMBAR SPINE - 2-3 VIEW; DG C-ARM 61-120 MIN COMPARISON:  CT lumbar spine 03/29/2016 and lumbar spine series [id number]. FINDINGS: AP and lateral intraoperative fluoroscopic spot views of the lumbar spine show pedicle screws at L3, L4 and L5, per report, with interbody cages. IMPRESSION: Intraoperative visualization for L3-5 lumbar fusion. Electronically Signed   By: Lorin Picket M.D.   On: 06/18/2016 12:11    Assessment/Plan: Postop day #1: The patient is doing well. We will mobilize her with physical therapy. She may go home tomorrow.  LOS: 1 day     [name] D 06/19/2016, 8:04 AM

## 2016-06-19 NOTE — Progress Notes (Signed)
Pt's CBG was 408. Dr. Arnoldo Morale notified, orders given to administer 20 units of Novolog. 20 Units of Novolog given. Well recheck CBG. Holli Humbles, RN

## 2016-06-19 NOTE — Progress Notes (Signed)
Inpatient Diabetes Program Recommendations  AACE/ADA: New Consensus Statement on Inpatient Glycemic Control (2015)  Target Ranges:  Prepandial:   less than 140 mg/dL      Peak postprandial:   less than 180 mg/dL (1-2 hours)      Critically ill patients:  140 - 180 mg/dL   Lab Results  Component Value Date   GLUCAP 341 (H) 06/19/2016   HGBA1C 10.2 (H) 06/18/2016    Review of Glycemic Control Results for [name], [name] (MRN [id number]) as of 06/19/2016 10:13  Ref. Range 04/21/2016 07:51 04/21/2016 12:19 06/18/2016 06:40 06/18/2016 13:13 06/18/2016 17:09 06/18/2016 21:30 06/19/2016 07:46 06/19/2016 09:14  Glucose-Capillary Latest Ref Range: 65 - 99 mg/dL 219 (H) 208 (H) 238 (H) 251 (H) 256 (H) 177 (H) 408 (H) 341 (H)   Diabetes history: DM2 Outpatient Diabetes medications: Soliqua 60 qd + Novolog 25 units tid meal coverage+ Metformin 500 mg q hs Current orders for Inpatient glycemic control: Metformin 500 mg q hs + Novolog correction 0-20 units tid  Inpatient Diabetes Program Recommendations:    Please consider: -Lantus 60 units daily -Novolog meal coverage 10 units tid if eats 50%  Spoke with pt and husband @ bedside about A1C 10.2 results and explained what an A1C is, basic pathophysiology of DM Type 2, basic home care, basic diabetes diet nutrition principles, importance of checking CBGs and maintaining good CBG control to prevent long-term and short-term complications. Reviewed signs and symptoms of hyperglycemia and hypoglycemia and how to treat hypoglycemia at home. Also reviewed blood sugar goals at home. Requested patient to keep log of what insulin she takes along with CBGs or take meter to office visits with Dr. Gabriel Carina. Patient shared that she does not take her Novolog ac meals regularly when works 12 hr shifts or goes out to eat. RNs to provide ongoing basic DM education at bedside with this patient. Have ordered educational booklet and DM videos. Patient and husband willing to attend  outpatient diabetes education and wants to join weightwatchers.  Thank you, Nani Gasser. Madellyn Denio, RN, MSN, CDE Inpatient Glycemic Control Team Team Pager [phone] (8am-5pm) 06/19/2016 10:25 AM

## 2016-06-19 NOTE — Evaluation (Signed)
Physical Therapy Evaluation Patient Details Name: [name] MRN: [id number] DOB: 06-11-1960 Today's Date: 06/19/2016   History of Present Illness  Pt is a 56 y/o female who presents s/p L2-L5 PLIF on 06/18/16.  Clinical Impression  Pt admitted with above diagnosis. Pt currently with functional limitations due to the deficits listed below (see PT Problem List). At the time of PT eval pt was able to perform transfers and ambulation with min guard to min assist for balance support and safety. Pt will need to complete stair training prior to d/c. Pt will benefit from skilled PT to increase their independence and safety with mobility to allow discharge to the venue listed below.       Follow Up Recommendations Outpatient PT    Equipment Recommendations  Rolling walker with 5" wheels;3in1 (PT)    Recommendations for Other Services       Precautions / Restrictions Precautions Precautions: Fall;Back Precaution Booklet Issued: Yes (comment) Precaution Comments: Reviewed with pt however she was very sleepy and was not able to retain Required Braces or Orthoses: Spinal Brace Spinal Brace: Lumbar corset;Applied in sitting position Restrictions Weight Bearing Restrictions: No      Mobility  Bed Mobility Overal bed mobility: Needs Assistance Bed Mobility: Rolling;Sidelying to Sit Rolling: Supervision Sidelying to sit: Min guard       General bed mobility comments: HOB slightly elevated and use of rails required. Pt was able to transition to EOB with min guard at trunk for shoulder stability.   Transfers Overall transfer level: Needs assistance Equipment used: Rolling walker (2 wheeled) Transfers: Sit to/from Stand Sit to Stand: Min assist         General transfer comment: Pt required x2 attempts before achieving full stand. Heavy min assist provided for support and balance.   Ambulation/Gait Ambulation/Gait assistance: Min guard Ambulation Distance (Feet): 75  Feet Assistive device: Rolling walker (2 wheeled) Gait Pattern/deviations: Step-through pattern;Decreased stride length;Trunk flexed Gait velocity: Decreased Gait velocity interpretation: Below normal speed for age/gender General Gait Details: VC's for improved posture and general safety with the RW. Pt fatigued quickly and required cues for eyes open during gait training.   Stairs            Wheelchair Mobility    Modified Rankin (Stroke Patients Only)       Balance Overall balance assessment: Needs assistance Sitting-balance support: Feet supported;No upper extremity supported Sitting balance-Leahy Scale: Fair     Standing balance support: No upper extremity supported;During functional activity Standing balance-Leahy Scale: Poor Standing balance comment: Requires hands on guarding or support on walker.                              Pertinent Vitals/Pain Pain Assessment: Faces Faces Pain Scale: Hurts little more Pain Location: Incision site Pain Descriptors / Indicators: Operative site guarding Pain Intervention(s): Limited activity within patient's tolerance;Monitored during session;Repositioned    Home Living Family/patient expects to be discharged to:: Private residence Living Arrangements: Spouse/significant other Available Help at Discharge: Family;Available 24 hours/day Type of Home: House Home Access: Stairs to enter Entrance Stairs-Rails: Psychiatric nurse of Steps: 3 Home Layout: One level Home Equipment: None      Prior Function Level of Independence: Independent               Hand Dominance   Dominant Hand: Right    Extremity/Trunk Assessment   Upper Extremity Assessment Upper Extremity Assessment: Overall WFL for  tasks assessed    Lower Extremity Assessment Lower Extremity Assessment: Generalized weakness (Consistent with pre-op diagnosis)    Cervical / Trunk Assessment Cervical / Trunk Assessment:  Normal (s/p surgery)  Communication   Communication: No difficulties  Cognition Arousal/Alertness: Lethargic;Suspect due to medications Behavior During Therapy: [facility] for tasks assessed/performed Overall Cognitive Status: Within Functional Limits for tasks assessed                      General Comments      Exercises     Assessment/Plan    PT Assessment Patient needs continued PT services  PT Problem List Decreased strength;Decreased range of motion;Decreased activity tolerance;Decreased balance;Decreased mobility;Decreased knowledge of use of DME;Decreased safety awareness;Decreased knowledge of precautions;Pain          PT Treatment Interventions DME instruction;Gait training;Stair training;Functional mobility training;Therapeutic activities;Therapeutic exercise;Neuromuscular re-education;Patient/family education    PT Goals (Current goals can be found in the Care Plan section)  Acute Rehab PT Goals Patient Stated Goal: Home at d/c PT Goal Formulation: With patient Time For Goal Achievement: 06/26/16 Potential to Achieve Goals: Good    Frequency Min 5X/week   Barriers to discharge        Co-evaluation               End of Session Equipment Utilized During Treatment: Gait belt Activity Tolerance: Patient limited by lethargy;Patient limited by fatigue Patient left: in chair;with call bell/phone within reach;with nursing/sitter in room Nurse Communication: Mobility status         Time: [id number] PT Time Calculation (min) (ACUTE ONLY): 30 min   Charges:   PT Evaluation $PT Eval Moderate Complexity: 1 Procedure PT Treatments $Gait Training: 8-22 mins   PT G Codes:        Thelma Comp 2016-07-06, 10:44 AM   Rolinda Roan, PT, DPT Acute Rehabilitation Services Pager: [phone]

## 2016-06-20 ENCOUNTER — Ambulatory Visit: Payer: 59

## 2016-06-20 LAB — GLUCOSE, CAPILLARY: Glucose-Capillary: 199 mg/dL — ABNORMAL HIGH (ref 65–99)

## 2016-06-20 MED ORDER — CYCLOBENZAPRINE HCL 5 MG PO TABS: 5.0000 mg | ORAL_TABLET | Freq: Three times a day (TID) | ORAL | 1 refills | Status: DC | PRN

## 2016-06-20 MED ORDER — CYCLOBENZAPRINE HCL 5 MG PO TABS: 5.0000 mg | ORAL_TABLET | Freq: Three times a day (TID) | ORAL | Status: DC | PRN

## 2016-06-20 MED ORDER — OXYCODONE-ACETAMINOPHEN 5-325 MG PO TABS: 1.0000 | ORAL_TABLET | ORAL | 0 refills | Status: DC | PRN

## 2016-06-20 MED ORDER — DOCUSATE SODIUM 100 MG PO CAPS: 100.0000 mg | ORAL_CAPSULE | Freq: Two times a day (BID) | ORAL | 0 refills | Status: DC

## 2016-06-20 MED FILL — CYCLOBENZAPRINE 5 MG TABLET: 5 | 17 days supply | Qty: 50 | Fill #0

## 2016-06-20 MED FILL — OXYCODONE W/APAP 5/325 TAB: 5-325 | 5 days supply | Qty: 50 | Fill #0

## 2016-06-20 NOTE — Discharge Summary (Signed)
Physician Discharge Summary  Patient ID: [name] MRN: [id number] DOB/AGE: [birth year] 56 y.o.  Admit date: 06/18/2016 Discharge date: 06/20/2016  Admission Diagnoses:Lumbar spondylolisthesis, lumbar spinal stenosis, neurogenic claudication, lumbago  Discharge Diagnoses: The same Active Problems:   Spondylolisthesis of lumbar region   Discharged Condition: good  Hospital Course: I performed a L3-4 and L4-5 decompression, instrumentation, and fusion, with L2-3 laminotomies on the patient on 06/18/2016. The surgery went well.  The patient's postoperative course was unremarkable. On postoperative day #2 the patient requested discharge to home. The patient, and her husband, were given written and oral discharge instructions. All their questions were answered.  Consults: Physical therapy Significant Diagnostic Studies: None Treatments: L2-3, L3-4 and L4-5 laminectomy, L3-4 and L4-5 instrumentation and fusion Discharge Exam: Blood pressure (!) 109/51, pulse 96, temperature 98.1 F (36.7 C), temperature source Oral, resp. rate 18, weight 105.7 kg (233 lb), SpO2 94 %. The patient is alert and pleasant. Her strength is grossly normal the lower extremities.  Disposition: Home  Discharge Instructions    AMB Referral to [facility] Care Management    Complete by:  As directed    Please assign UMR member for post discharge call. Provided Advance Directives packet per request as well as [employer] brochure for DM management. Currently at [facility]. Please call with questions. Marthenia Rolling, [place], RN,BSN-THN [facility] Liaison-[phone]   Reason for consult:  Please assign UMR member for post discharge call   Diagnoses of:  Diabetes   Expected date of contact:  1-3 days (reserved for hospital discharges)   Ambulatory referral to Nutrition and Diabetic Education    Complete by:  As directed    Call MD for:  difficulty breathing, headache or visual disturbances    Complete by:  As  directed    Call MD for:  extreme fatigue    Complete by:  As directed    Call MD for:  hives    Complete by:  As directed    Call MD for:  persistant dizziness or light-headedness    Complete by:  As directed    Call MD for:  persistant nausea and vomiting    Complete by:  As directed    Call MD for:  redness, tenderness, or signs of infection (pain, swelling, redness, odor or green/yellow discharge around incision site)    Complete by:  As directed    Call MD for:  severe uncontrolled pain    Complete by:  As directed    Call MD for:  temperature >100.4    Complete by:  As directed    Diet - low sodium heart healthy    Complete by:  As directed    Discharge instructions    Complete by:  As directed    Call [phone] for a followup appointment. Take a stool softener while you are using pain medications.   Driving Restrictions    Complete by:  As directed    Do not drive for 2 weeks.   Increase activity slowly    Complete by:  As directed    Lifting restrictions    Complete by:  As directed    Do not lift more than 5 pounds. No excessive bending or twisting.   May shower / Bathe    Complete by:  As directed    He may shower after the pain she is removed 3 days after surgery. Leave the incision alone.   Remove dressing in 24 hours    Complete by:  As directed      Allergies as of 06/20/2016      Reactions   Biaxin [clarithromycin] Nausea And Vomiting, Nausea Only      Medication List    STOP taking these medications   acetaminophen 500 MG tablet Commonly known as:  TYLENOL   nabumetone 750 MG tablet Commonly known as:  RELAFEN     TAKE these medications   atorvastatin 40 MG tablet Commonly known as:  LIPITOR Take 40 mg by mouth daily at 6 PM.   cyclobenzaprine 5 MG tablet Commonly known as:  FLEXERIL Take 1 tablet (5 mg total) by mouth 3 (three) times daily as needed for muscle spasms. What changed:  medication strength  how much to take   docusate  sodium 100 MG capsule Commonly known as:  COLACE Take 1 capsule (100 mg total) by mouth 2 (two) times daily.   DULoxetine 60 MG capsule Commonly known as:  CYMBALTA Take 60 mg by mouth at bedtime.   gabapentin 300 MG capsule Commonly known as:  NEURONTIN Take 300-600 mg by mouth 2 (two) times daily. 300 mg in the morning and 600 mg at night   insulin aspart 100 UNIT/ML FlexPen Commonly known as:  NOVOLOG Inject 25 Units into the skin 3 (three) times daily before meals.   Insulin Glargine-Lixisenatide 100-33 UNT-MCG/ML Sopn Inject 60 Units into the skin daily.   losartan 25 MG tablet Commonly known as:  COZAAR Take 25 mg by mouth every morning.   metFORMIN 500 MG 24 hr tablet Commonly known as:  GLUCOPHAGE-XR Take 500 mg by mouth at bedtime.   metoprolol succinate 25 MG 24 hr tablet Commonly known as:  TOPROL-XL Take 25 mg by mouth every morning.   omeprazole 40 MG capsule Commonly known as:  PRILOSEC Take 40 mg by mouth 2 (two) times daily.   ondansetron 4 MG tablet Commonly known as:  ZOFRAN Take 1 tablet (4 mg total) by mouth every 8 (eight) hours as needed for nausea or vomiting.   oxyCODONE-acetaminophen 5-325 MG tablet Commonly known as:  PERCOCET/ROXICET Take 1-2 tablets by mouth every 4 (four) hours as needed for moderate pain. What changed:  Another medication with the same name was added. Make sure you understand how and when to take each.   oxyCODONE-acetaminophen 5-325 MG tablet Commonly known as:  PERCOCET/ROXICET Take 1-2 tablets by mouth every 4 (four) hours as needed for moderate pain. What changed:  You were already taking a medication with the same name, and this prescription was added. Make sure you understand how and when to take each.   pioglitazone 30 MG tablet Commonly known as:  ACTOS Take 30 mg by mouth daily.   SUDAFED 12 HOUR PO Take 1 tablet by mouth daily as needed (sinus congestion). Sinus headache   traMADol 50 MG tablet Commonly  known as:  ULTRAM Take 50-100 mg by mouth 2 (two) times daily as needed for moderate pain (depends on pain level if 50-100 mg).   UNIFINE PENTIPS 31G X 5 MM Misc Generic drug:  Insulin Pen Needle   zolpidem 5 MG tablet Commonly known as:  AMBIEN Take 5 mg by mouth at bedtime as needed for sleep. Reported on 08/30/2015            Durable Medical Equipment        Start     Ordered   06/19/16 0835  For home use only DME 3 n 1  Once     06/19/16  0834   06/19/16 0835  For home use only DME Walker rolling  Once    Question:  Patient needs a walker to treat with the following condition  Answer:  Unsteady gait   06/19/16 0834       Signed: Newman Pies D 06/20/2016, 11:40 AM

## 2016-06-20 NOTE — Discharge Instructions (Signed)

## 2016-06-20 NOTE — Progress Notes (Signed)
Pt and husband given D/C instructions with Rx's, verbal understanding was provided. Pt's incision is clean and dry with no sign of infection. Pt's IV was removed prior to D/C. Pt D/C'd home via wheelchair @ 1300 per MD order. Pt received RW and 3-n-1 from [place] prior to D/C. Pt is stable @ D/C and has no other needs at this time. Holli Humbles, RN

## 2016-06-20 NOTE — Progress Notes (Signed)
Physical Therapy Treatment Patient Details Name: [name] MRN: [id number] DOB: 1960/07/17 Today's Date: 06/20/2016    History of Present Illness Pt is a 56 y/o female who presents s/p L2-L5 PLIF on 06/18/16.    PT Comments    Pt much more awake and clear today. Great mobility progress noted. Stair training complete. Possible d/c home today.  Follow Up Recommendations  Outpatient PT     Equipment Recommendations  Rolling walker with 5" wheels;3in1 (PT)    Recommendations for Other Services       Precautions / Restrictions Precautions Precautions: Fall;Back Precaution Comments: Pt independently recalled 3/3 back precautions. Required Braces or Orthoses: Spinal Brace Spinal Brace: Lumbar corset;Applied in sitting position Restrictions Weight Bearing Restrictions: No    Mobility  Bed Mobility               General bed mobility comments: Pt received in recliner.  Transfers   Equipment used: Rolling walker (2 wheeled)   Sit to Stand: Min guard         General transfer comment: increased time to complete  Ambulation/Gait Ambulation/Gait assistance: Supervision Ambulation Distance (Feet): 300 Feet Assistive device: Rolling walker (2 wheeled) Gait Pattern/deviations: Step-through pattern;Decreased stride length Gait velocity: Decreased Gait velocity interpretation: Below normal speed for age/gender General Gait Details: pt demo safe technique   Stairs Stairs: Yes   Stair Management: One rail Right;Sideways Number of Stairs: 3 General stair comments: verbal cues/instructions for sequencing and technique; increased time to complete  Wheelchair Mobility    Modified Rankin (Stroke Patients Only)       Balance   Sitting-balance support: Feet supported;No upper extremity supported Sitting balance-Leahy Scale: Good     Standing balance support: During functional activity;Bilateral upper extremity supported Standing balance-Leahy Scale:  Fair Standing balance comment: RW for ambulation                    Cognition Arousal/Alertness: Awake/alert Behavior During Therapy: WFL for tasks assessed/performed Overall Cognitive Status: Within Functional Limits for tasks assessed                      Exercises      General Comments        Pertinent Vitals/Pain Pain Assessment: 0-10 Pain Score: 6  Pain Location: sx site Pain Descriptors / Indicators: Sore Pain Intervention(s): Monitored during session    Home Living                      Prior Function            PT Goals (current goals can now be found in the care plan section) Acute Rehab PT Goals Patient Stated Goal: Home at d/c PT Goal Formulation: With patient Time For Goal Achievement: 06/26/16 Potential to Achieve Goals: Good Progress towards PT goals: Progressing toward goals    Frequency    Min 5X/week      PT Plan Current plan remains appropriate    Co-evaluation             End of Session Equipment Utilized During Treatment: Gait belt;Back brace Activity Tolerance: Patient tolerated treatment well Patient left: in chair;with call bell/phone within reach     Time: 0957-1024 PT Time Calculation (min) (ACUTE ONLY): 27 min  Charges:  $Gait Training: 23-37 mins                    G Codes:  Lorriane Shire 06/20/2016, 11:02 AM

## 2016-06-22 ENCOUNTER — Other Ambulatory Visit: Payer: Self-pay | Admitting: *Deleted

## 2016-06-22 ENCOUNTER — Encounter: Payer: Self-pay | Admitting: *Deleted

## 2016-06-22 NOTE — Patient Outreach (Addendum)
[facility]) Care Management  06/22/2016  [name] August 05, 1960 [id number]   Subjective: Telephone call to patient's home / mobile number, spoke with patient, and HIPAA verified.   Discussed [facility] Care Management UMR Transition of care follow up, patient voiced understanding, and is in agreement to complete follow up.   Patient states she is doing well came home from hospital on 06/20/16, just took some pain medication, and requested to talk to this RNCM at this time instead of RNCM calling back on 06/26/16.    RNCM educated patient on the importance of hospital follow up with primary MD, voices understanding, and states she will call MD's office to schedule follow up appointment.   States she will also call surgeon's office today to schedule 2 week follow up appointment.  Patient states she is accessing all her [employer] benefits discussed in preop call with [facility] RNCM and will call pastoral services to set up an appointment to complete Advanced Directives.  Patient states she does not have any transition of care, care coordination, disease management, disease monitoring, transportation, community resource, or pharmacy needs at this time.  States she is very appreciative of the follow up call and is in agreement to receive [place] Management information.    Objective: Per chart review: patient hospitalized 06/18/16 - 06/20/16 for Lumbar spondylolisthesis.   Status post on 06/18/16 Bilateral L2-3 Laminotomy/foraminotomies, L3 and L4 laminectomy to decompress the bilateral L3, L4 and L5 nerve roots(the work required to do this was in addition to the work required to do the posterior lumbar interbody fusion because of the patient's spinal stenosis, facet arthropathy. Etc. requiring a wide decompression of the nerve roots.); L3-4 and L4-5 transforaminal lumbar interbody fusion with local morselized autograft bone and Kinnex graft extender; insertion of interbody prosthesis at L3-4 and L4-5  (globus peek expandable interbody prosthesis); posterior segmental instrumentation from L3 to L5 with globus titanium pedicle screws and rods; posterior lateral arthrodesis at L3-4 and L4-5 with local morselized autograft bone and Kinnex bone graft extender. Patient hospitalized  1/21/7 - 04/21/16 for cervical stenosis.   Status post Cervical 3-4 stenosis with radiculopathy and status post fusion C4-C7 on 05/23/15.   Patient also hospitalized 08/24/15 - 09/04/15 for right renal stone.  Status post Right percutaneous nephrostolithotomy for stone greater than 2 cm 2.  Right nephrostogram 3.  Intraoperative fluoroscopy, under 1 hour, with interpretation 4.  Placement of a 6 x 26 double-J ureteral stent. 5.  Percutaneous access into the Right renal collecting system 6.  Placement of a 31 French nephrostomy tube 7.  Dilation of percutaneous tract on 08/24/15.    Assessment: Received UMR Transition of care referral on 06/19/16.   Transition of care follow up completed, no care management need, and will proceed with case closure.   Plan: RNCM will send patient successful outreach letter, [facility] pamphlet, and magnet. RNCM will send case closure due to follow up completed / no care management needs request to [facility] [facility].   [name] H. Annia Friendly, BSN, [place] Management [facility] Telephonic CM Phone: [phone] Fax: [phone]

## 2016-06-26 MED FILL — OXYCODONE W/APAP 5/325 TAB: 5-325 | 8 days supply | Qty: 90 | Fill #0

## 2016-07-04 MED FILL — CYCLOBENZAPRINE 5 MG TABLET: 5 | 17 days supply | Qty: 50 | Fill #1

## 2016-07-04 MED FILL — OXYCODONE W/APAP 5/325 TAB: 5-325 | 8 days supply | Qty: 90 | Fill #0

## 2016-07-13 DIAGNOSIS — I10 Essential (primary) hypertension: Secondary | ICD-10-CM | POA: Diagnosis not present

## 2016-07-13 DIAGNOSIS — Z6841 Body Mass Index (BMI) 40.0 and over, adult: Secondary | ICD-10-CM | POA: Diagnosis not present

## 2016-07-13 DIAGNOSIS — M4316 Spondylolisthesis, lumbar region: Secondary | ICD-10-CM | POA: Diagnosis not present

## 2016-07-13 MED FILL — OXYCODONE-APAP 10-325: 10-325 | 8 days supply | Qty: 50 | Fill #0

## 2016-07-23 MED FILL — NOVOLOG FLEXPEN SYRINGE: 100 | 20 days supply | Qty: 15 | Fill #3

## 2016-07-23 MED FILL — SOLIQUA 100 UNIT-33 MCG/ML: 100-33 | 25 days supply | Qty: 15 | Fill #3

## 2016-07-24 MED FILL — HYDROCODON-APAP 5-325: 5-325 | 13 days supply | Qty: 50 | Fill #0

## 2016-07-24 MED FILL — CYCLOBENZAPRINE 10 MG TAB: 10 | 17 days supply | Qty: 50 | Fill #0

## 2016-07-27 DIAGNOSIS — Z760 Encounter for issue of repeat prescription: Secondary | ICD-10-CM | POA: Diagnosis not present

## 2016-07-31 ENCOUNTER — Ambulatory Visit
Admission: RE | Admit: 2016-07-31 | Discharge: 2016-07-31 | Disposition: A | Discharge status: Home / Self Care | Payer: 59 | Source: Ambulatory Visit | Attending: Obstetrics & Gynecology | Admitting: Obstetrics & Gynecology

## 2016-07-31 ENCOUNTER — Encounter: Payer: Self-pay | Admitting: Obstetrics & Gynecology

## 2016-07-31 DIAGNOSIS — Z1231 Encounter for screening mammogram for malignant neoplasm of breast: Secondary | ICD-10-CM | POA: Insufficient documentation

## 2016-08-07 ENCOUNTER — Other Ambulatory Visit: Payer: Self-pay | Admitting: *Deleted

## 2016-08-07 ENCOUNTER — Inpatient Hospital Stay
Admission: RE | Admit: 2016-08-07 | Discharge: 2016-08-07 | Disposition: A | Discharge status: Home / Self Care | Payer: Self-pay | Source: Ambulatory Visit | Attending: *Deleted | Admitting: *Deleted

## 2016-08-07 DIAGNOSIS — Z9289 Personal history of other medical treatment: Secondary | ICD-10-CM

## 2016-08-27 MED FILL — NABUMETONE 750 MG TABLET: 750 | 60 days supply | Qty: 120 | Fill #1

## 2016-08-27 MED FILL — ATORVASTATIN 40 MG TABLET: 40 | 90 days supply | Qty: 90 | Fill #1

## 2016-08-27 MED FILL — PIOGLITAZONE HCL 30 MG TAB: 30 | 90 days supply | Qty: 90 | Fill #1

## 2016-08-27 MED FILL — METFORMIN HCL ER 500 MG TAB: 500 | 90 days supply | Qty: 90 | Fill #1

## 2016-08-27 MED FILL — OMEPRAZOLE DR 40 MG CAPSULE: 40 | 90 days supply | Qty: 180 | Fill #1

## 2016-08-27 MED FILL — METOPROLOL SUCC ER 25 MG TA: 25 | 90 days supply | Qty: 90 | Fill #1

## 2016-08-27 MED FILL — GABAPENTIN 300 MG CAPSULE: 300 | 90 days supply | Qty: 270 | Fill #1

## 2016-08-27 MED FILL — SOLIQUA 100 UNIT-33 MCG/ML: 100-33 | 25 days supply | Qty: 15 | Fill #4

## 2016-08-27 MED FILL — NOVOLOG FLEXPEN SYRINGE: 100 | 20 days supply | Qty: 15 | Fill #4

## 2016-08-27 MED FILL — LOSARTAN POTASSIUM 25 MG TA: 25 | 90 days supply | Qty: 90 | Fill #1

## 2016-08-27 MED FILL — DULoxetine HCL 60 MG CPEP: 60 | 90 days supply | Qty: 90 | Fill #1

## 2016-08-29 MED FILL — UNIFINE PENTIPS 31GX3/16: 31G X 5 MM | 90 days supply | Qty: 400 | Fill #0

## 2016-08-29 MED FILL — UNIFINE PENTIPS 31GX3/16": 31G X 5 MM | 90 days supply | Qty: 400 | Fill #0

## 2016-09-25 MED FILL — SOLIQUA 100 UNIT-33 MCG/ML: 100-33 | 25 days supply | Qty: 15 | Fill #5

## 2016-10-17 MED FILL — NOVOLOG FLEXPEN SYRINGE: 100 | 20 days supply | Qty: 15 | Fill #5

## 2016-10-17 MED FILL — SOLIQUA 100 UNIT-33 MCG/ML: 100-33 | 26 days supply | Qty: 15 | Fill #1

## 2016-10-26 NOTE — Addendum Note (Signed)
Addendum  created 10/26/16 0938 by Kyce Ging, MD   Sign clinical note    

## 2016-11-15 DIAGNOSIS — E1165 Type 2 diabetes mellitus with hyperglycemia: Secondary | ICD-10-CM | POA: Diagnosis not present

## 2016-11-15 DIAGNOSIS — G4733 Obstructive sleep apnea (adult) (pediatric): Secondary | ICD-10-CM | POA: Diagnosis not present

## 2016-11-15 DIAGNOSIS — E7800 Pure hypercholesterolemia, unspecified: Secondary | ICD-10-CM | POA: Diagnosis not present

## 2016-11-16 DIAGNOSIS — E1165 Type 2 diabetes mellitus with hyperglycemia: Secondary | ICD-10-CM | POA: Diagnosis not present

## 2016-11-16 DIAGNOSIS — E538 Deficiency of other specified B group vitamins: Secondary | ICD-10-CM | POA: Insufficient documentation

## 2016-11-16 DIAGNOSIS — E7800 Pure hypercholesterolemia, unspecified: Secondary | ICD-10-CM | POA: Diagnosis not present

## 2016-11-16 DIAGNOSIS — R202 Paresthesia of skin: Secondary | ICD-10-CM | POA: Diagnosis not present

## 2016-11-16 DIAGNOSIS — Z794 Long term (current) use of insulin: Secondary | ICD-10-CM | POA: Diagnosis not present

## 2016-11-16 MED FILL — NOVOLOG FLEXPEN SYRINGE: 100 | 20 days supply | Qty: 15 | Fill #6

## 2016-11-16 MED FILL — SOLIQUA 100 UNIT-33 MCG/ML: 100-33 | 26 days supply | Qty: 15 | Fill #2

## 2016-11-19 MED FILL — METFORMIN HCL ER 500 MG TAB: 500 | 90 days supply | Qty: 180 | Fill #0

## 2016-11-22 MED FILL — NABUMETONE 750 MG TABLET: 750 | 60 days supply | Qty: 120 | Fill #0

## 2016-11-22 MED FILL — OMEPRAZOLE DR 40 MG CAPSULE: 40 | 90 days supply | Qty: 180 | Fill #1

## 2016-11-22 MED FILL — ATORVASTATIN 40 MG TABLET: 40 | 90 days supply | Qty: 90 | Fill #2

## 2016-11-22 MED FILL — METOPROLOL SUCC ER 25 MG TA: 25 | 90 days supply | Qty: 90 | Fill #2

## 2016-11-22 MED FILL — DULoxetine HCL 60 MG CPEP: 60 | 90 days supply | Qty: 90 | Fill #0

## 2016-11-22 MED FILL — PIOGLITAZONE HCL 30 MG TAB: 30 | 90 days supply | Qty: 90 | Fill #0

## 2016-11-22 MED FILL — GABAPENTIN 300 MG CAPSULE: 300 | 90 days supply | Qty: 270 | Fill #0

## 2016-11-22 MED FILL — LOSARTAN POTASSIUM 25 MG TA: 25 | 90 days supply | Qty: 90 | Fill #0

## 2016-12-03 DIAGNOSIS — I10 Essential (primary) hypertension: Secondary | ICD-10-CM | POA: Diagnosis not present

## 2016-12-03 DIAGNOSIS — G959 Disease of spinal cord, unspecified: Secondary | ICD-10-CM | POA: Diagnosis not present

## 2016-12-03 DIAGNOSIS — Z6841 Body Mass Index (BMI) 40.0 and over, adult: Secondary | ICD-10-CM | POA: Diagnosis not present

## 2016-12-19 DIAGNOSIS — E538 Deficiency of other specified B group vitamins: Secondary | ICD-10-CM | POA: Diagnosis not present

## 2016-12-24 DIAGNOSIS — G4733 Obstructive sleep apnea (adult) (pediatric): Secondary | ICD-10-CM | POA: Diagnosis not present

## 2016-12-24 DIAGNOSIS — M5416 Radiculopathy, lumbar region: Secondary | ICD-10-CM | POA: Diagnosis not present

## 2016-12-24 DIAGNOSIS — E1165 Type 2 diabetes mellitus with hyperglycemia: Secondary | ICD-10-CM | POA: Diagnosis not present

## 2016-12-24 DIAGNOSIS — J4520 Mild intermittent asthma, uncomplicated: Secondary | ICD-10-CM | POA: Diagnosis not present

## 2016-12-27 MED FILL — SOLIQUA 100 UNIT-33 MCG/ML: 100-33 | 26 days supply | Qty: 15 | Fill #3

## 2016-12-27 MED FILL — NOVOLOG FLEXPEN SYRINGE: 100 | 20 days supply | Qty: 15 | Fill #7

## 2017-01-03 DIAGNOSIS — E1165 Type 2 diabetes mellitus with hyperglycemia: Secondary | ICD-10-CM | POA: Diagnosis not present

## 2017-01-03 DIAGNOSIS — R0602 Shortness of breath: Secondary | ICD-10-CM | POA: Diagnosis not present

## 2017-01-03 DIAGNOSIS — D649 Anemia, unspecified: Secondary | ICD-10-CM | POA: Diagnosis not present

## 2017-01-03 DIAGNOSIS — G4733 Obstructive sleep apnea (adult) (pediatric): Secondary | ICD-10-CM | POA: Diagnosis not present

## 2017-01-07 DIAGNOSIS — R0602 Shortness of breath: Secondary | ICD-10-CM | POA: Diagnosis not present

## 2017-01-07 DIAGNOSIS — G4733 Obstructive sleep apnea (adult) (pediatric): Secondary | ICD-10-CM | POA: Diagnosis not present

## 2017-01-07 DIAGNOSIS — E1165 Type 2 diabetes mellitus with hyperglycemia: Secondary | ICD-10-CM | POA: Diagnosis not present

## 2017-01-08 DIAGNOSIS — R0600 Dyspnea, unspecified: Secondary | ICD-10-CM | POA: Insufficient documentation

## 2017-01-08 DIAGNOSIS — R0609 Other forms of dyspnea: Secondary | ICD-10-CM | POA: Insufficient documentation

## 2017-01-09 DIAGNOSIS — M5136 Other intervertebral disc degeneration, lumbar region: Secondary | ICD-10-CM | POA: Diagnosis not present

## 2017-01-09 DIAGNOSIS — E1165 Type 2 diabetes mellitus with hyperglycemia: Secondary | ICD-10-CM | POA: Diagnosis not present

## 2017-01-09 DIAGNOSIS — G4733 Obstructive sleep apnea (adult) (pediatric): Secondary | ICD-10-CM | POA: Diagnosis not present

## 2017-01-22 MED FILL — NOVOLOG FLEXPEN SYRINGE: 100 | 20 days supply | Qty: 15 | Fill #8

## 2017-01-22 MED FILL — SOLIQUA 100 UNIT-33 MCG/ML: 100-33 | 26 days supply | Qty: 15 | Fill #4

## 2017-02-06 DIAGNOSIS — G4733 Obstructive sleep apnea (adult) (pediatric): Secondary | ICD-10-CM | POA: Diagnosis not present

## 2017-02-09 DIAGNOSIS — G4733 Obstructive sleep apnea (adult) (pediatric): Secondary | ICD-10-CM | POA: Diagnosis not present

## 2017-02-25 MED FILL — PIOGLITAZONE HCL 30 MG TAB: 30 | 90 days supply | Qty: 90 | Fill #1

## 2017-02-25 MED FILL — ATORVASTATIN 40 MG TABLET: 40 | 30 days supply | Qty: 30 | Fill #0

## 2017-02-25 MED FILL — DULoxetine HCL 60 MG CPEP: 60 | 90 days supply | Qty: 90 | Fill #1

## 2017-02-25 MED FILL — SOLIQUA 100 UNIT-33 MCG/ML: 100-33 | 26 days supply | Qty: 15 | Fill #5

## 2017-02-25 MED FILL — OMEPRAZOLE DR 40 MG CAPSULE: 40 | 90 days supply | Qty: 180 | Fill #0

## 2017-02-25 MED FILL — GABAPENTIN 300 MG CAPSULE: 300 | 90 days supply | Qty: 270 | Fill #1

## 2017-02-25 MED FILL — LOSARTAN POTASSIUM 25 MG TA: 25 | 90 days supply | Qty: 90 | Fill #1

## 2017-02-25 MED FILL — NOVOLOG FLEXPEN SYRINGE: 100 | 20 days supply | Qty: 15 | Fill #9

## 2017-02-25 MED FILL — METFORMIN HCL ER 500 MG TAB: 500 | 90 days supply | Qty: 180 | Fill #1

## 2017-02-25 MED FILL — METOPROLOL SUCC ER 25 MG TA: 25 | 90 days supply | Qty: 90 | Fill #3

## 2017-02-25 MED FILL — NABUMETONE 750 MG TABLET: 750 | 60 days supply | Qty: 120 | Fill #1

## 2017-03-21 MED FILL — FLUCONAZOLE 150 MG TABLET: 150 | 1 days supply | Qty: 1 | Fill #0

## 2017-03-22 ENCOUNTER — Other Ambulatory Visit: Payer: Self-pay | Admitting: Neurosurgery

## 2017-03-22 DIAGNOSIS — M545 Low back pain: Secondary | ICD-10-CM | POA: Diagnosis not present

## 2017-03-22 DIAGNOSIS — M542 Cervicalgia: Secondary | ICD-10-CM

## 2017-03-22 DIAGNOSIS — I10 Essential (primary) hypertension: Secondary | ICD-10-CM | POA: Diagnosis not present

## 2017-03-22 DIAGNOSIS — Z6841 Body Mass Index (BMI) 40.0 and over, adult: Secondary | ICD-10-CM | POA: Diagnosis not present

## 2017-03-22 MED FILL — traMADol HCL 50 MG TABS: 50 | 6 days supply | Qty: 50 | Fill #0

## 2017-03-25 DIAGNOSIS — G4739 Other sleep apnea: Secondary | ICD-10-CM | POA: Diagnosis not present

## 2017-03-28 DIAGNOSIS — E538 Deficiency of other specified B group vitamins: Secondary | ICD-10-CM | POA: Diagnosis not present

## 2017-03-28 DIAGNOSIS — G4733 Obstructive sleep apnea (adult) (pediatric): Secondary | ICD-10-CM | POA: Diagnosis not present

## 2017-03-28 DIAGNOSIS — E611 Iron deficiency: Secondary | ICD-10-CM | POA: Diagnosis not present

## 2017-03-28 DIAGNOSIS — E1165 Type 2 diabetes mellitus with hyperglycemia: Secondary | ICD-10-CM | POA: Diagnosis not present

## 2017-04-03 DIAGNOSIS — Z0000 Encounter for general adult medical examination without abnormal findings: Secondary | ICD-10-CM | POA: Diagnosis not present

## 2017-04-03 DIAGNOSIS — J4520 Mild intermittent asthma, uncomplicated: Secondary | ICD-10-CM | POA: Diagnosis not present

## 2017-04-03 DIAGNOSIS — D509 Iron deficiency anemia, unspecified: Secondary | ICD-10-CM | POA: Insufficient documentation

## 2017-04-03 DIAGNOSIS — G4733 Obstructive sleep apnea (adult) (pediatric): Secondary | ICD-10-CM | POA: Diagnosis not present

## 2017-04-03 DIAGNOSIS — E1165 Type 2 diabetes mellitus with hyperglycemia: Secondary | ICD-10-CM | POA: Diagnosis not present

## 2017-04-06 IMAGING — MR MR CERVICAL SPINE W/O CM
5 series · 34 of 48 positions shown · non-contrast
Comparison: None.

CLINICAL DATA: Cervical radiculitis. Neck pain with bilateral
shoulder pain.

EXAM:
MRI CERVICAL SPINE WITHOUT CONTRAST
TECHNIQUE: Multiplanar, multisequence MR imaging of the cervical spine was
performed. No intravenous contrast was administered.

[Series 2: T2 · sagittal · 3.0mm · 0.70mm/px · 7 of 13 slices shown (1 of 2)]
[im 1/13]
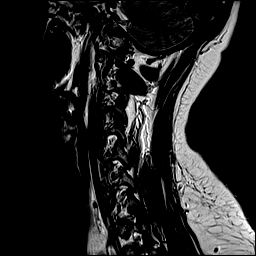
[im 3/13]
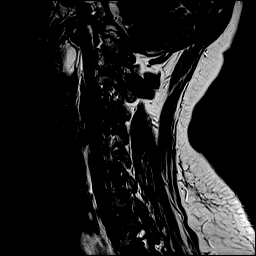
[im 5/13]
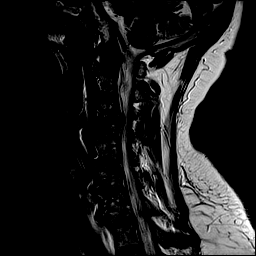
[im 7/13]
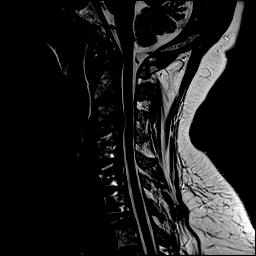
[im 9/13]
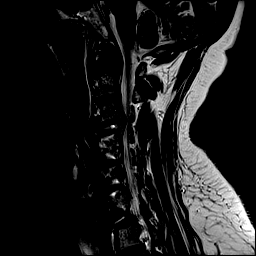
[im 11/13]
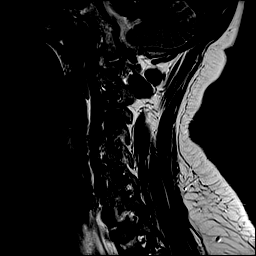
[im 13/13]
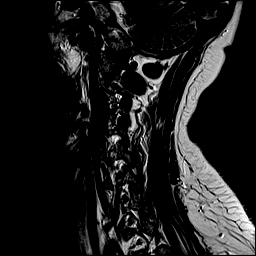

[Series 3: T1 · sagittal · 3.0mm · 0.70mm/px · 6 of 13 slices shown]
[im 1/13]
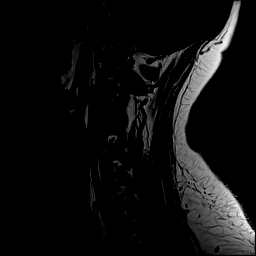
[im 3/13]
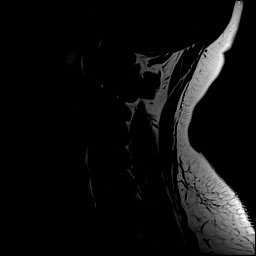
[im 5/13]
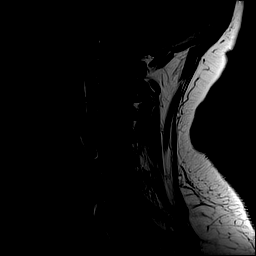
[im 8/13]
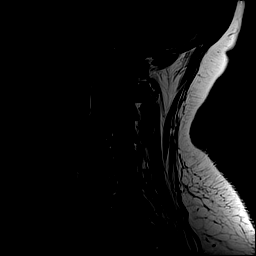
[im 10/13]
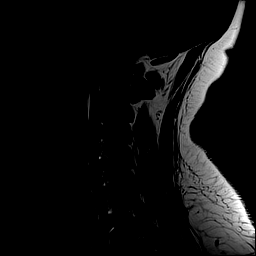
[im 13/13]
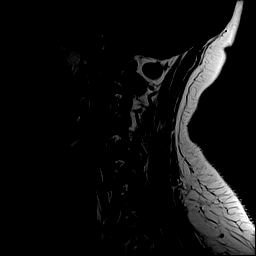

[Series 4: STIR · sagittal · 3.0mm · 0.35mm/px · 7 of 15 slices shown]
[im 1/15]
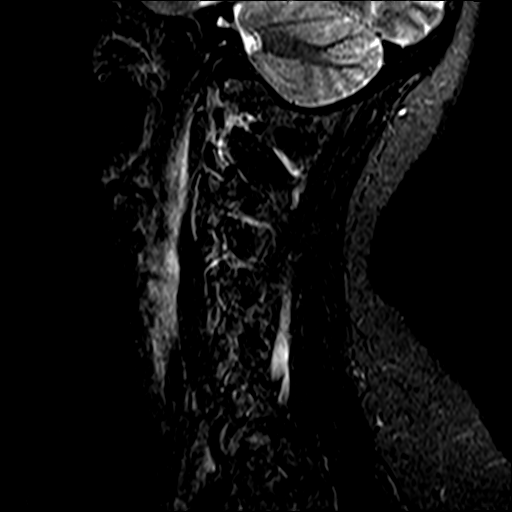
[im 3/15]
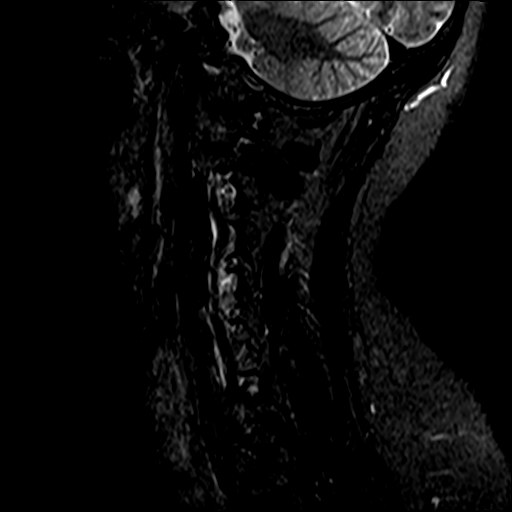
[im 5/15]
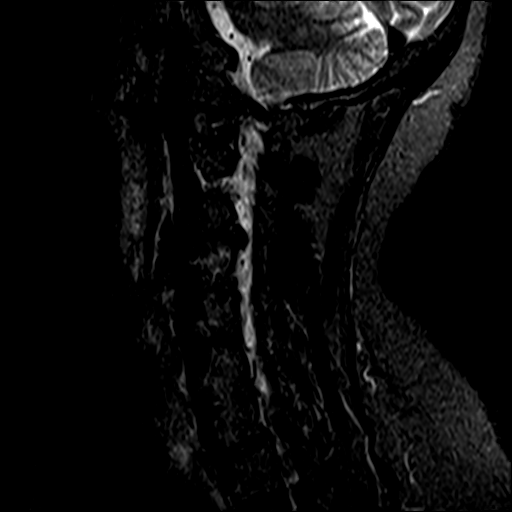
[im 8/15]
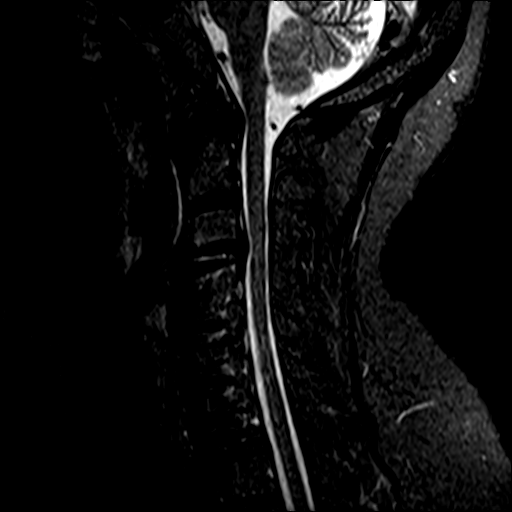
[im 10/15]
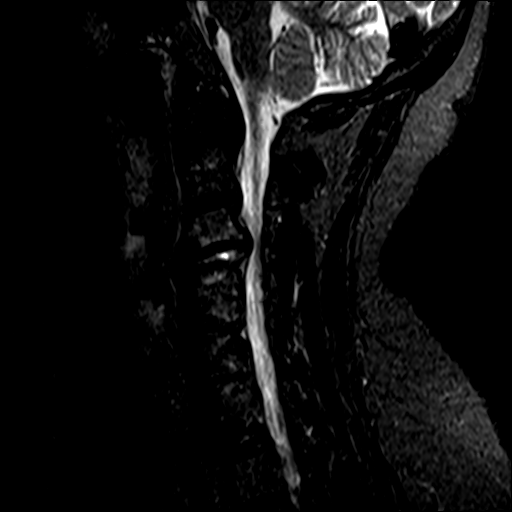
[im 12/15]
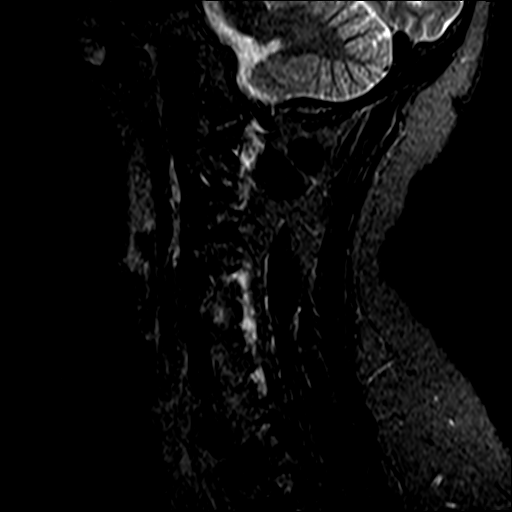
[im 15/15]
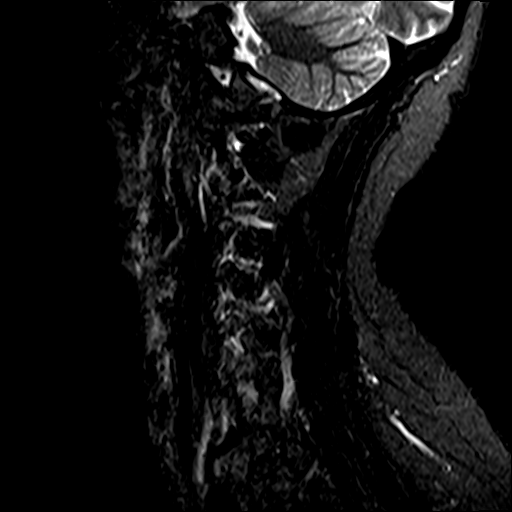

[Series 5: T2 · axial · 3.0mm · 0.70mm/px · z∈[-24,+79]mm · 8 of 28 slices shown (2 of 2)]
[im 1/28]
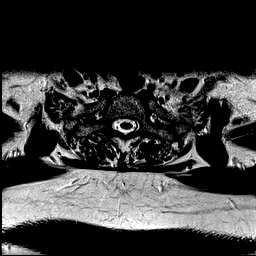
[im 5/28]
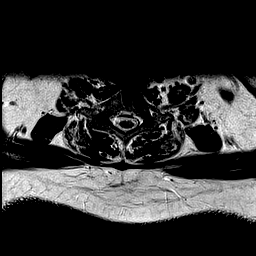
[im 9/28]
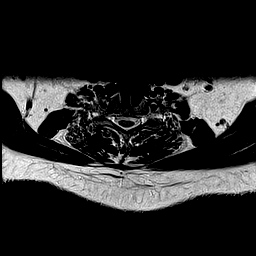
[im 13/28]
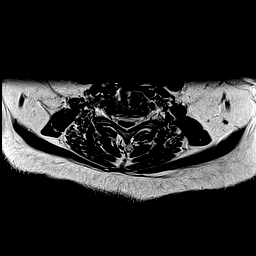
[im 15/28]
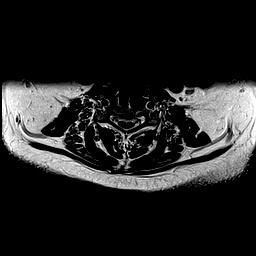
[im 19/28]
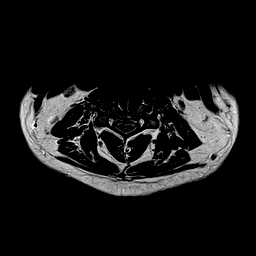
[im 23/28]
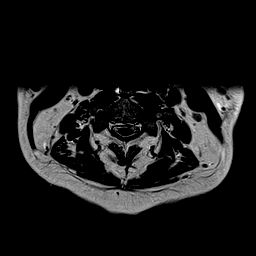
[im 28/28]
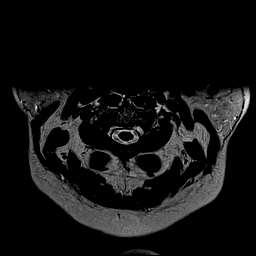

[Series 6: mpgr ax · axial · 3.0mm · 0.35mm/px · z∈[-24,+45]mm · 6 of 28 slices shown]
[im 1/28]
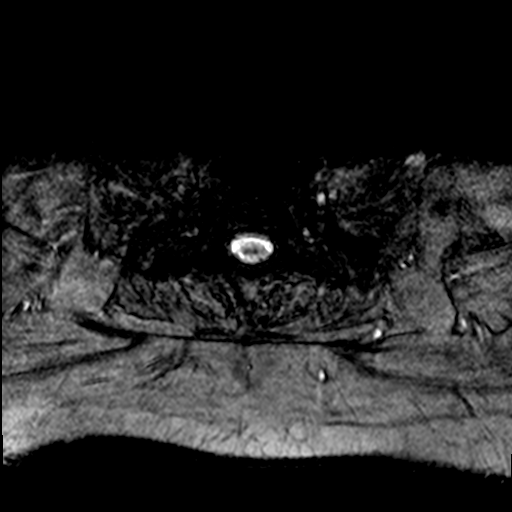
[im 5/28]
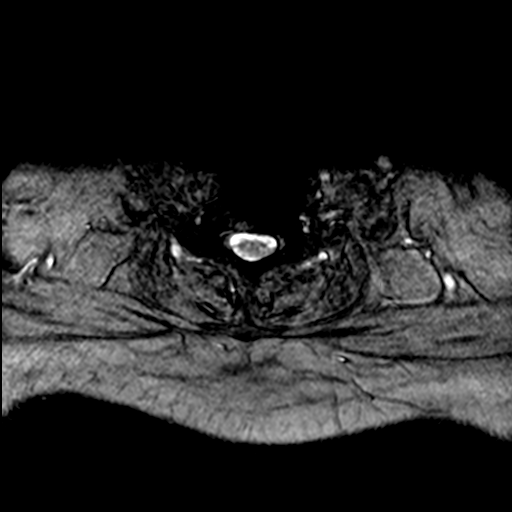
[im 9/28]
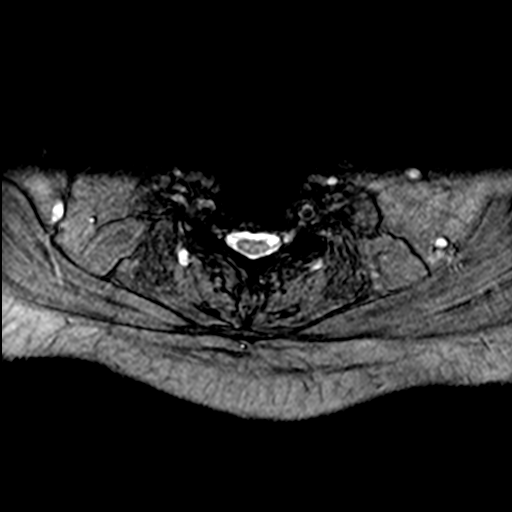
[im 13/28]
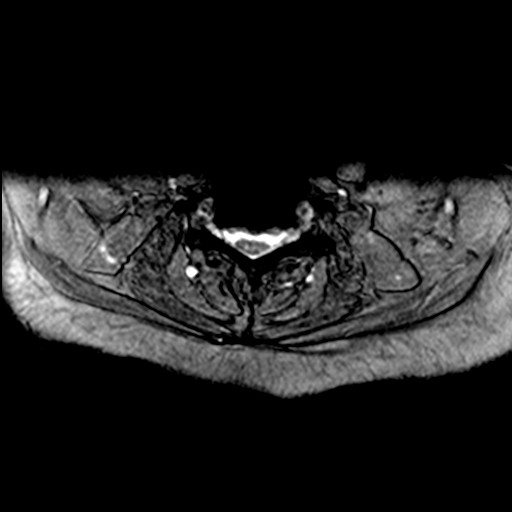
[im 15/28]
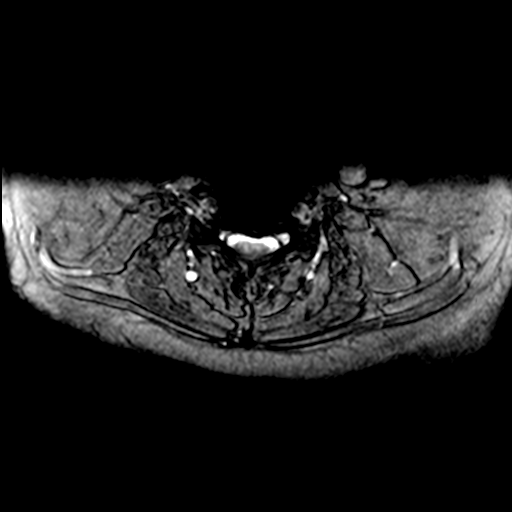
[im 19/28]
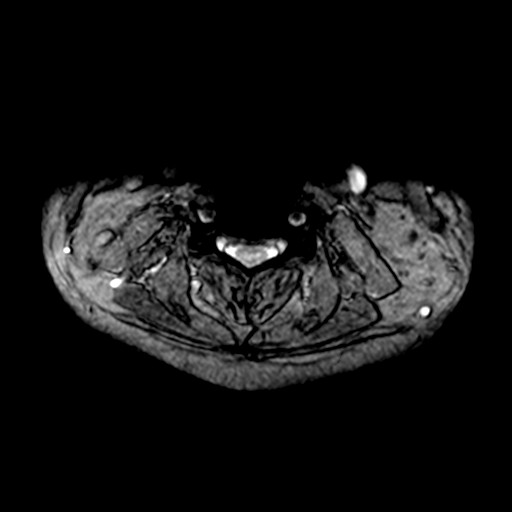

[34 of 48 positions shown; findings below may reference images not displayed]

FINDINGS: ACDF C4 through C7 with anterior plate and screws. Solid bony fusion
C4 through C7.

Straightening of the cervical lordosis. Normal alignment. No
fracture or mass. Spinal cord signal normal. Cervical medullary
junction normal.

C2-3: Mild uncinate spurring on the left with mild left foraminal
narrowing.

C3-4: Moderately large broad-based disc protrusion. Associated
diffuse uncinate spurring. Cord flattening with moderate spinal
stenosis and moderate foraminal stenosis bilaterally.

C4-5:  ACDF without stenosis

C5-6:  ACDF without stenosis

C6-7:  ACDF without stenosis

C7-T1: Bilateral facet hypertrophy. Right foraminal narrowing due to
facet hypertrophy. No cord deformity.
IMPRESSION: C3-4 moderate spinal stenosis. Diffuse disc protrusion and extensive
uncinate spurring causing cord flattening and moderate spinal and
foraminal stenosis bilaterally.

Solid fusion C4-5, C5-6, C6-7 without stenosis

Right foraminal encroachment C7-T1 due to facet hypertrophy.

## 2017-04-08 ENCOUNTER — Ambulatory Visit
Admission: RE | Admit: 2017-04-08 | Discharge: 2017-04-08 | Disposition: A | Discharge status: Home / Self Care | Payer: 59 | Source: Ambulatory Visit | Attending: Neurosurgery | Admitting: Neurosurgery

## 2017-04-08 DIAGNOSIS — Z981 Arthrodesis status: Secondary | ICD-10-CM | POA: Insufficient documentation

## 2017-04-08 DIAGNOSIS — M4802 Spinal stenosis, cervical region: Secondary | ICD-10-CM | POA: Diagnosis not present

## 2017-04-08 DIAGNOSIS — M50222 Other cervical disc displacement at C5-C6 level: Secondary | ICD-10-CM | POA: Diagnosis not present

## 2017-04-08 DIAGNOSIS — M542 Cervicalgia: Secondary | ICD-10-CM

## 2017-04-22 DIAGNOSIS — E1165 Type 2 diabetes mellitus with hyperglycemia: Secondary | ICD-10-CM | POA: Diagnosis not present

## 2017-04-22 DIAGNOSIS — Z794 Long term (current) use of insulin: Secondary | ICD-10-CM | POA: Diagnosis not present

## 2017-04-22 MED FILL — HUMULIN R 500 UNITS/ML KWIK: 500 | 20 days supply | Qty: 6 | Fill #0

## 2017-04-22 MED FILL — TRULICITY 1.5 MG/0.5 ML PEN: 1.5 | 28 days supply | Qty: 2 | Fill #0

## 2017-04-24 DIAGNOSIS — G4739 Other sleep apnea: Secondary | ICD-10-CM | POA: Diagnosis not present

## 2017-04-24 MED FILL — POTASSIUM CL 10 MEQ TAB SA: 10 | 30 days supply | Qty: 30 | Fill #0

## 2017-04-24 MED FILL — FUROSEMIDE 20 MG TABS: 20 | 30 days supply | Qty: 30 | Fill #0

## 2017-05-06 DIAGNOSIS — G4739 Other sleep apnea: Secondary | ICD-10-CM | POA: Diagnosis not present

## 2017-05-06 DIAGNOSIS — G4733 Obstructive sleep apnea (adult) (pediatric): Secondary | ICD-10-CM | POA: Diagnosis not present

## 2017-05-06 MED FILL — VENTOLIN HFA 90 MCG INHALER: 108 (90 BAS | 17 days supply | Qty: 18 | Fill #0

## 2017-05-09 DIAGNOSIS — J4521 Mild intermittent asthma with (acute) exacerbation: Secondary | ICD-10-CM | POA: Diagnosis not present

## 2017-05-09 DIAGNOSIS — J209 Acute bronchitis, unspecified: Secondary | ICD-10-CM | POA: Diagnosis not present

## 2017-05-16 MED FILL — HUMULIN R 500 UNITS/ML KWIK: 500 | 20 days supply | Qty: 6 | Fill #1

## 2017-05-16 MED FILL — TRULICITY 1.5 MG/0.5 ML PEN: 1.5 | 28 days supply | Qty: 2 | Fill #1

## 2017-05-25 DIAGNOSIS — G4739 Other sleep apnea: Secondary | ICD-10-CM | POA: Diagnosis not present

## 2017-05-28 DIAGNOSIS — E1165 Type 2 diabetes mellitus with hyperglycemia: Secondary | ICD-10-CM | POA: Diagnosis not present

## 2017-05-28 DIAGNOSIS — E1129 Type 2 diabetes mellitus with other diabetic kidney complication: Secondary | ICD-10-CM | POA: Diagnosis not present

## 2017-05-28 DIAGNOSIS — Z79899 Other long term (current) drug therapy: Secondary | ICD-10-CM | POA: Diagnosis not present

## 2017-05-30 MED FILL — ATORVASTATIN 40 MG TABLET: 40 | 90 days supply | Qty: 90 | Fill #0

## 2017-05-30 MED FILL — FUROSEMIDE 20 MG TABS: 20 | 30 days supply | Qty: 30 | Fill #0

## 2017-05-30 MED FILL — OMEPRAZOLE DR 40 MG CAPSULE: 40 | 90 days supply | Qty: 180 | Fill #0

## 2017-05-30 MED FILL — GABAPENTIN 300 MG CAPSULE: 300 | 90 days supply | Qty: 270 | Fill #0

## 2017-05-30 MED FILL — KLOR-CON M10 TABLET: 10 | 30 days supply | Qty: 30 | Fill #0

## 2017-05-30 MED FILL — PIOGLITAZONE HCL 30 MG TAB: 30 | 90 days supply | Qty: 90 | Fill #0

## 2017-05-30 MED FILL — traMADol HCL 50 MG TABS: 50 | 30 days supply | Qty: 60 | Fill #0

## 2017-05-30 MED FILL — GLIMEPIRIDE 4 MG TABLET: 4 | 90 days supply | Qty: 90 | Fill #0

## 2017-05-30 MED FILL — METOPROLOL SUCC ER 25 MG TA: 25 | 90 days supply | Qty: 90 | Fill #0

## 2017-05-30 MED FILL — UNIFINE PENTIPS 31GX3/16": 31G X 5 MM | 75 days supply | Qty: 300 | Fill #0

## 2017-05-30 MED FILL — UNIFINE PENTIPS 31GX3/16: 31G X 5 MM | 75 days supply | Qty: 300 | Fill #0

## 2017-05-30 MED FILL — DULoxetine HCL 60 MG CPEP: 60 | 90 days supply | Qty: 90 | Fill #0

## 2017-05-30 MED FILL — LOSARTAN POTASSIUM 25 MG TA: 25 | 90 days supply | Qty: 90 | Fill #0

## 2017-05-31 MED FILL — NABUMETONE 750 MG TABLET: 750 | 30 days supply | Qty: 60 | Fill #2

## 2017-06-17 MED FILL — HUMULIN R 500 UNITS/ML KWIK: 500 | 20 days supply | Qty: 6 | Fill #2

## 2017-06-17 MED FILL — TRULICITY 1.5 MG/0.5 ML PEN: 1.5 | 28 days supply | Qty: 2 | Fill #2

## 2017-06-25 DIAGNOSIS — G4739 Other sleep apnea: Secondary | ICD-10-CM | POA: Diagnosis not present

## 2017-06-26 ENCOUNTER — Encounter: Payer: Self-pay | Admitting: Obstetrics & Gynecology

## 2017-06-27 MED FILL — NABUMETONE 750 MG TABLET: 750 | 90 days supply | Qty: 180 | Fill #0

## 2017-06-28 ENCOUNTER — Ambulatory Visit: Payer: Self-pay | Admitting: Obstetrics & Gynecology

## 2017-07-01 DIAGNOSIS — D508 Other iron deficiency anemias: Secondary | ICD-10-CM | POA: Diagnosis not present

## 2017-07-01 DIAGNOSIS — D472 Monoclonal gammopathy: Secondary | ICD-10-CM | POA: Diagnosis not present

## 2017-07-01 DIAGNOSIS — E1165 Type 2 diabetes mellitus with hyperglycemia: Secondary | ICD-10-CM | POA: Diagnosis not present

## 2017-07-01 DIAGNOSIS — E538 Deficiency of other specified B group vitamins: Secondary | ICD-10-CM | POA: Diagnosis not present

## 2017-07-11 DIAGNOSIS — M47816 Spondylosis without myelopathy or radiculopathy, lumbar region: Secondary | ICD-10-CM | POA: Diagnosis not present

## 2017-07-11 DIAGNOSIS — J4520 Mild intermittent asthma, uncomplicated: Secondary | ICD-10-CM | POA: Diagnosis not present

## 2017-07-11 DIAGNOSIS — G4733 Obstructive sleep apnea (adult) (pediatric): Secondary | ICD-10-CM | POA: Diagnosis not present

## 2017-07-11 DIAGNOSIS — E1165 Type 2 diabetes mellitus with hyperglycemia: Secondary | ICD-10-CM | POA: Diagnosis not present

## 2017-07-11 MED FILL — TRIAMCINOLONE 0.5% CREAM: 0.5 | 30 days supply | Qty: 15 | Fill #0

## 2017-07-15 ENCOUNTER — Other Ambulatory Visit: Payer: Self-pay | Admitting: Internal Medicine

## 2017-07-15 DIAGNOSIS — Z1231 Encounter for screening mammogram for malignant neoplasm of breast: Secondary | ICD-10-CM

## 2017-07-15 MED FILL — TRULICITY 1.5 MG/0.5 ML PEN: 1.5 | 28 days supply | Qty: 2 | Fill #3

## 2017-07-15 MED FILL — HUMULIN R 500 UNITS/ML KWIK: 500 | 20 days supply | Qty: 6 | Fill #3

## 2017-07-30 DIAGNOSIS — M545 Low back pain: Secondary | ICD-10-CM | POA: Diagnosis not present

## 2017-07-30 DIAGNOSIS — I10 Essential (primary) hypertension: Secondary | ICD-10-CM | POA: Diagnosis not present

## 2017-07-30 DIAGNOSIS — Z6841 Body Mass Index (BMI) 40.0 and over, adult: Secondary | ICD-10-CM | POA: Diagnosis not present

## 2017-07-31 ENCOUNTER — Encounter: Payer: Self-pay | Admitting: Obstetrics & Gynecology

## 2017-07-31 ENCOUNTER — Ambulatory Visit (INDEPENDENT_AMBULATORY_CARE_PROVIDER_SITE_OTHER): Payer: 59 | Admitting: Obstetrics & Gynecology

## 2017-07-31 VITALS — BP 162/90 | HR 93 | Ht 63.0 in | Wt 255.0 lb

## 2017-07-31 DIAGNOSIS — Z0000 Encounter for general adult medical examination without abnormal findings: Secondary | ICD-10-CM

## 2017-07-31 DIAGNOSIS — Z124 Encounter for screening for malignant neoplasm of cervix: Secondary | ICD-10-CM | POA: Diagnosis not present

## 2017-07-31 DIAGNOSIS — Z1211 Encounter for screening for malignant neoplasm of colon: Secondary | ICD-10-CM | POA: Diagnosis not present

## 2017-07-31 NOTE — Progress Notes (Signed)
HPI:      [name] is a 57 y.o. G1P1001 who LMP was in the past, she presents today for her annual examination.  The patient has no complaints today. The patient is not sexually active. Herlast pap: approximate date 2017 (Dec) and was normal and last mammogram: approximate date 2017 and was normal. LEEP 2015. The patient does perform self breast exams.  There is no notable family history of breast or ovarian cancer in her family. The patient is not taking hormone replacement therapy. Patient denies post-menopausal vaginal bleeding.   The patient has regular exercise: yes. The patient denies current symptoms of depression.    GYN Hx: Last Colonoscopy:5 years ago. Normal.  Last DEXA: never ago.    PMHx: Past Medical History:  Diagnosis Date  . Arthritis   . Asthma without status asthmaticus 08/02/2015   Overview:  mild, not requiring treatment   . Cervical spinal stenosis   . Degeneration of intervertebral disc of cervical region 04/28/2015  . Depression   . Diabetes ([place])   . GERD (gastroesophageal reflux disease)   . Headache, migraine 08/02/2015  . Heartburn   . History of kidney stones   . HLD (hyperlipidemia)   . Kidney stones 07/18/2015  . Lumbar stenosis   . Pancreatitis   . Radiculopathy of cervical region   . Sleep apnea    does not wear cpap    Past Surgical History:  Procedure Laterality Date  . ANTERIOR CERVICAL DECOMP/DISCECTOMY FUSION N/A 04/20/2016   Procedure: CERVICAL THREE-FOUR  ANTERIOR CERVICAL DECOMPRESSION/DISCECTOMY/FUSION WITH PARTIAL REMOVAL OF PLATE AT CERVICAL FOUR;  Surgeon: Leeroy Cha, MD;  Location: [facility];  Service: Neurosurgery;  Laterality: N/A;  . [place]  . CERVICAL BIOPSY  W/ LOOP ELECTRODE EXCISION  08/12/2013  . CERVICAL FUSION  2009  . COLONOSCOPY    . COLPOSCOPY  03/26/2013  . CYSTOSCOPY WITH STENT PLACEMENT Right 08/24/2015   Procedure: CYSTOSCOPY WITH STENT PLACEMENT;  Surgeon: Cleon Gustin, MD;   [facility];  Service: Urology;  Laterality: Right;  . NEPHROLITHOTOMY Right 08/24/2015   Procedure: NEPHROLITHOTOMY PERCUTANEOUS WITH SURGEON TO GET ACCESS;  Surgeon: Cleon Gustin, MD;  [facility];  Service: Urology;  Laterality: Right;  . TUBAL LIGATION     Family History  Problem Relation Age of Onset  . Heart failure Mother   . Heart attack Father   . Colon cancer Maternal Grandfather 9  . Kidney disease Neg Hx   . Bladder Cancer Neg Hx    Social History   Tobacco Use  . Smoking status: Never Smoker  . Smokeless tobacco: Never Used  Substance Use Topics  . Alcohol use: Yes    Alcohol/week: 0.0 oz    Comment: occasional  . Drug use: No    Current Outpatient Medications:  .  atorvastatin (LIPITOR) 40 MG tablet, Take 40 mg by mouth daily at 6 PM., Disp: , Rfl:  .  cyclobenzaprine (FLEXERIL) 5 MG tablet, Take 1 tablet (5 mg total) by mouth 3 (three) times daily as needed for muscle spasms., Disp: 50 tablet, Rfl: 1 .  docusate sodium (COLACE) 100 MG capsule, Take 1 capsule (100 mg total) by mouth 2 (two) times daily., Disp: 60 capsule, Rfl: 0 .  DULoxetine (CYMBALTA) 60 MG capsule, Take 60 mg by mouth at bedtime. , Disp: , Rfl:  .  gabapentin (NEURONTIN) 300 MG capsule, Take 300-600 mg by mouth 2 (two) times daily. 300 mg  in the morning and 600 mg at night, Disp: , Rfl:  .  Insulin Glargine-Lixisenatide 100-33 UNT-MCG/ML SOPN, Inject 60 Units into the skin daily., Disp: , Rfl:  .  losartan (COZAAR) 25 MG tablet, Take 25 mg by mouth every morning. , Disp: , Rfl:  .  metFORMIN (GLUCOPHAGE-XR) 500 MG 24 hr tablet, Take 500 mg by mouth at bedtime. , Disp: , Rfl:  .  metoprolol succinate (TOPROL-XL) 25 MG 24 hr tablet, Take 25 mg by mouth every morning. , Disp: , Rfl:  .  omeprazole (PRILOSEC) 40 MG capsule, Take 40 mg by mouth 2 (two) times daily. , Disp: , Rfl:  .  ondansetron (ZOFRAN) 4 MG tablet, Take 1 tablet (4 mg total) by mouth every 8 (eight) hours as needed for  nausea or vomiting., Disp: 20 tablet, Rfl: 0 .  oxyCODONE-acetaminophen (PERCOCET/ROXICET) 5-325 MG tablet, Take 1-2 tablets by mouth every 4 (four) hours as needed for moderate pain., Disp: 60 tablet, Rfl: 0 .  oxyCODONE-acetaminophen (PERCOCET/ROXICET) 5-325 MG tablet, Take 1-2 tablets by mouth every 4 (four) hours as needed for moderate pain., Disp: 50 tablet, Rfl: 0 .  pioglitazone (ACTOS) 30 MG tablet, Take 30 mg by mouth daily., Disp: , Rfl:  .  Pseudoephedrine HCl (SUDAFED 12 HOUR PO), Take 1 tablet by mouth daily as needed (sinus congestion). Sinus headache , Disp: , Rfl:  .  traMADol (ULTRAM) 50 MG tablet, Take 50-100 mg by mouth 2 (two) times daily as needed for moderate pain (depends on pain level if 50-100 mg). , Disp: , Rfl:  .  UNIFINE PENTIPS 31G X 5 MM MISC, , Disp: , Rfl: 12 .  zolpidem (AMBIEN) 5 MG tablet, Take 5 mg by mouth at bedtime as needed for sleep. Reported on 08/30/2015, Disp: , Rfl:  Allergies: Biaxin [clarithromycin]  Review of Systems  Constitutional: Negative for chills, fever and malaise/fatigue.  HENT: Negative for congestion, sinus pain and sore throat.   Eyes: Negative for blurred vision and pain.  Respiratory: Negative for cough and wheezing.   Cardiovascular: Negative for chest pain and leg swelling.  Gastrointestinal: Negative for abdominal pain, constipation, diarrhea, heartburn, nausea and vomiting.  Genitourinary: Negative for dysuria, frequency, hematuria and urgency.  Musculoskeletal: Negative for back pain, joint pain, myalgias and neck pain.  Skin: Negative for itching and rash.  Neurological: Negative for dizziness, tremors and weakness.  Endo/Heme/Allergies: Does not bruise/bleed easily.  Psychiatric/Behavioral: Negative for depression. The patient is not nervous/anxious and does not have insomnia.     Objective: BP (!) 162/90   Pulse 93   Ht 5\' 3"  (1.6 m)   Wt 255 lb (115.7 kg)   BMI 45.17 kg/m   Filed Weights   07/31/17 0933  Weight:  255 lb (115.7 kg)   Body mass index is 45.17 kg/m. Physical Exam  Constitutional: She is oriented to person, place, and time. She appears well-developed and well-nourished. No distress.  Genitourinary: Rectum normal, vagina normal and uterus normal. Pelvic exam was performed with patient supine. There is no rash or lesion on the right labia. There is no rash or lesion on the left labia. Vagina exhibits no lesion. No bleeding in the vagina. Right adnexum does not display mass and does not display tenderness. Left adnexum does not display mass and does not display tenderness. Cervix does not exhibit motion tenderness, lesion, friability or polyp.   Uterus is mobile and midaxial. Uterus is not enlarged or exhibiting a mass.  HENT:  Head: Normocephalic  and atraumatic. Head is without laceration.  Right Ear: Hearing normal.  Left Ear: Hearing normal.  Nose: No epistaxis.  No foreign bodies.  Mouth/Throat: Uvula is midline, oropharynx is clear and moist and mucous membranes are normal.  Eyes: Pupils are equal, round, and reactive to light.  Neck: Normal range of motion. Neck supple. No thyromegaly present.  Cardiovascular: Normal rate and regular rhythm. Exam reveals no gallop and no friction rub.  No murmur heard. Pulmonary/Chest: Effort normal and breath sounds normal. No respiratory distress. She has no wheezes. Right breast exhibits no mass, no skin change and no tenderness. Left breast exhibits no mass, no skin change and no tenderness.  Abdominal: Soft. Bowel sounds are normal. She exhibits no distension. There is no tenderness. There is no rebound.  Musculoskeletal: Normal range of motion.  Neurological: She is alert and oriented to person, place, and time. No cranial nerve deficit.  Skin: Skin is warm and dry.  Psychiatric: She has a normal mood and affect. Judgment normal.  Vitals reviewed.   Assessment: Annual Exam 1. Screen for colon cancer   2. Screening for cervical cancer   3.  Annual physical exam    Plan:            1.  Cervical Screening-  Pap smear done today  2. Breast screening- Exam annually and mammogram scheduled  3. Colonoscopy every 10 years, Hemoccult testing after age 35  4. Labs managed by PCP  5. Counseling for hormonal therapy: none     F/U  Return in about 1 year (around 08/01/2018) for Annual.  Barnett Applebaum, MD, [facility], [facility] 07/31/2017  9:57 AM

## 2017-07-31 NOTE — Patient Instructions (Signed)
PAP every  years Mammogram every year    Call [phone] to schedule at [facility] Colonoscopy every 10 years Labs yearly (with PCP)

## 2017-08-02 LAB — PAP IG (IMAGE GUIDED): PAP SMEAR COMMENT: 0

## 2017-08-06 ENCOUNTER — Ambulatory Visit
Admission: RE | Admit: 2017-08-06 | Discharge: 2017-08-06 | Disposition: A | Discharge status: Home / Self Care | Payer: 59 | Source: Ambulatory Visit | Attending: Internal Medicine | Admitting: Internal Medicine

## 2017-08-06 DIAGNOSIS — Z1231 Encounter for screening mammogram for malignant neoplasm of breast: Secondary | ICD-10-CM | POA: Diagnosis not present

## 2017-08-07 MED FILL — TRULICITY 1.5 MG/0.5 ML PEN: 1.5 | 28 days supply | Qty: 2 | Fill #4

## 2017-08-07 MED FILL — HUMULIN R 500 UNITS/ML KWIK: 500 | 20 days supply | Qty: 6 | Fill #4

## 2017-08-27 DIAGNOSIS — E1165 Type 2 diabetes mellitus with hyperglycemia: Secondary | ICD-10-CM | POA: Diagnosis not present

## 2017-08-27 DIAGNOSIS — Z794 Long term (current) use of insulin: Secondary | ICD-10-CM | POA: Diagnosis not present

## 2017-08-28 MED FILL — PIOGLITAZONE HCL 30 MG TAB: 30 | 90 days supply | Qty: 90 | Fill #1

## 2017-08-28 MED FILL — METOPROLOL SUCCINATE ER 25: 25 | 90 days supply | Qty: 90 | Fill #1

## 2017-08-28 MED FILL — DULoxetine HCL 60 MG CPEP: 60 | 90 days supply | Qty: 90 | Fill #1

## 2017-08-28 MED FILL — traMADol HCL 50 MG TABS: 50 | 30 days supply | Qty: 60 | Fill #1

## 2017-08-28 MED FILL — ATORVASTATIN 40 MG TABLET: 40 | 90 days supply | Qty: 90 | Fill #1

## 2017-08-28 MED FILL — LOSARTAN POTASSIUM 25 MG TA: 25 | 90 days supply | Qty: 90 | Fill #1

## 2017-08-28 MED FILL — OMEPRAZOLE DR 40 MG CAPSULE: 40 | 90 days supply | Qty: 180 | Fill #1

## 2017-09-02 DIAGNOSIS — E1129 Type 2 diabetes mellitus with other diabetic kidney complication: Secondary | ICD-10-CM | POA: Diagnosis not present

## 2017-09-02 DIAGNOSIS — I10 Essential (primary) hypertension: Secondary | ICD-10-CM | POA: Diagnosis not present

## 2017-09-02 DIAGNOSIS — E1165 Type 2 diabetes mellitus with hyperglycemia: Secondary | ICD-10-CM | POA: Diagnosis not present

## 2017-09-05 MED FILL — HUMULIN R 500 UNITS/ML KWIK: 500 | 20 days supply | Qty: 6 | Fill #5

## 2017-09-05 MED FILL — TRULICITY 1.5 MG/0.5 ML PEN: 1.5 | 28 days supply | Qty: 2 | Fill #5

## 2017-09-19 DIAGNOSIS — M65312 Trigger thumb, left thumb: Secondary | ICD-10-CM | POA: Diagnosis not present

## 2017-10-09 MED FILL — NABUMETONE 750 MG TABLET: 750 | 90 days supply | Qty: 180 | Fill #1

## 2017-10-09 MED FILL — TRULICITY 1.5 MG/0.5 ML PEN: 1.5 | 28 days supply | Qty: 2 | Fill #6

## 2017-10-09 MED FILL — HUMULIN R 500 UNITS/ML KWIK: 500 | 20 days supply | Qty: 6 | Fill #6

## 2017-11-05 MED FILL — HUMULIN R 500 UNITS/ML KWIK: 500 | 20 days supply | Qty: 6 | Fill #7

## 2017-11-05 MED FILL — traMADol HCL 50 MG TABS: 50 | 30 days supply | Qty: 60 | Fill #2

## 2017-11-05 MED FILL — TRULICITY 1.5 MG/0.5 ML PEN: 1.5 | 28 days supply | Qty: 2 | Fill #7

## 2017-12-02 MED FILL — HUMULIN R 500 UNITS/ML KWIK: 500 | 20 days supply | Qty: 6 | Fill #8

## 2017-12-02 MED FILL — TRULICITY 1.5 MG/0.5 ML PEN: 1.5 | 28 days supply | Qty: 2 | Fill #8

## 2017-12-03 MED FILL — ATORVASTATIN 40 MG TABLET: 40 | 90 days supply | Qty: 90 | Fill #0

## 2017-12-03 MED FILL — METOPROLOL SUCCINATE ER 25: 25 | 90 days supply | Qty: 90 | Fill #0

## 2017-12-03 MED FILL — GABAPENTIN 300 MG CAPSULE: 300 | 90 days supply | Qty: 270 | Fill #0

## 2017-12-03 MED FILL — DULoxetine HCL 60 MG CPEP: 60 | 90 days supply | Qty: 90 | Fill #0

## 2017-12-03 MED FILL — traMADol HCL 50 MG TABS: 50 | 30 days supply | Qty: 60 | Fill #0

## 2017-12-03 MED FILL — OMEPRAZOLE DR 40 MG CAPSULE: 40 | 90 days supply | Qty: 180 | Fill #0

## 2017-12-03 MED FILL — LOSARTAN POTASSIUM 25 MG TA: 25 | 90 days supply | Qty: 90 | Fill #0

## 2017-12-04 MED FILL — PIOGLITAZONE HCL 30 MG TAB: 30 | 90 days supply | Qty: 90 | Fill #0

## 2017-12-31 MED FILL — TRULICITY 1.5 MG/0.5 ML PEN: 1.5 | 28 days supply | Qty: 2 | Fill #9

## 2017-12-31 MED FILL — HUMULIN R 500 UNITS/ML KWIK: 500 | 20 days supply | Qty: 6 | Fill #9

## 2017-12-31 MED FILL — traMADol HCL 50 MG TABS: 50 | 30 days supply | Qty: 60 | Fill #1

## 2018-01-15 MED FILL — NABUMETONE 750 MG TABLET: 750 | 90 days supply | Qty: 180 | Fill #0

## 2018-01-31 MED FILL — TRULICITY 1.5 MG/0.5 ML PEN: 1.5 | 28 days supply | Qty: 2 | Fill #10

## 2018-01-31 MED FILL — HUMULIN R 500 UNITS/ML KWIK: 500 | 20 days supply | Qty: 6 | Fill #10

## 2018-01-31 MED FILL — traMADol HCL 50 MG TABS: 50 | 30 days supply | Qty: 60 | Fill #2

## 2018-02-13 MED FILL — CEPHALEXIN 500 MG CAPSULE: 500 | 5 days supply | Qty: 15 | Fill #0

## 2018-02-14 DIAGNOSIS — E7800 Pure hypercholesterolemia, unspecified: Secondary | ICD-10-CM | POA: Diagnosis not present

## 2018-02-14 DIAGNOSIS — Z794 Long term (current) use of insulin: Secondary | ICD-10-CM | POA: Diagnosis not present

## 2018-02-14 DIAGNOSIS — E1165 Type 2 diabetes mellitus with hyperglycemia: Secondary | ICD-10-CM | POA: Diagnosis not present

## 2018-02-14 DIAGNOSIS — D508 Other iron deficiency anemias: Secondary | ICD-10-CM | POA: Diagnosis not present

## 2018-02-20 DIAGNOSIS — G4733 Obstructive sleep apnea (adult) (pediatric): Secondary | ICD-10-CM | POA: Diagnosis not present

## 2018-02-20 DIAGNOSIS — M5030 Other cervical disc degeneration, unspecified cervical region: Secondary | ICD-10-CM | POA: Diagnosis not present

## 2018-02-20 DIAGNOSIS — E1165 Type 2 diabetes mellitus with hyperglycemia: Secondary | ICD-10-CM | POA: Diagnosis not present

## 2018-02-20 DIAGNOSIS — J4520 Mild intermittent asthma, uncomplicated: Secondary | ICD-10-CM | POA: Diagnosis not present

## 2018-03-04 MED FILL — METOPROLOL SUCCINATE ER 25: 25 | 90 days supply | Qty: 90 | Fill #1

## 2018-03-04 MED FILL — LOSARTAN POTASSIUM 25 MG TA: 25 | 90 days supply | Qty: 90 | Fill #1

## 2018-03-04 MED FILL — PIOGLITAZONE HCL 30 MG TAB: 30 | 90 days supply | Qty: 90 | Fill #1

## 2018-03-04 MED FILL — ATORVASTATIN 40 MG TABLET: 40 | 90 days supply | Qty: 90 | Fill #1

## 2018-03-04 MED FILL — GABAPENTIN 300 MG CAPSULE: 300 | 90 days supply | Qty: 270 | Fill #1

## 2018-03-04 MED FILL — TRULICITY 1.5 MG/0.5 ML PEN: 1.5 | 28 days supply | Qty: 2 | Fill #11

## 2018-03-04 MED FILL — traMADol HCL 50 MG TABS: 50 | 30 days supply | Qty: 60 | Fill #3

## 2018-03-04 MED FILL — OMEPRAZOLE 40 MG CPDR: 40 | 90 days supply | Qty: 180 | Fill #1

## 2018-03-04 MED FILL — CEFUROXIME AXETIL 250 MG TA: 250 | 7 days supply | Qty: 14 | Fill #0

## 2018-03-04 MED FILL — DULoxetine HCL 60 MG CPEP: 60 | 90 days supply | Qty: 90 | Fill #1

## 2018-03-07 DIAGNOSIS — E1165 Type 2 diabetes mellitus with hyperglycemia: Secondary | ICD-10-CM | POA: Diagnosis not present

## 2018-03-07 DIAGNOSIS — E1169 Type 2 diabetes mellitus with other specified complication: Secondary | ICD-10-CM | POA: Diagnosis not present

## 2018-03-07 DIAGNOSIS — E1129 Type 2 diabetes mellitus with other diabetic kidney complication: Secondary | ICD-10-CM | POA: Insufficient documentation

## 2018-03-07 DIAGNOSIS — I10 Essential (primary) hypertension: Secondary | ICD-10-CM | POA: Insufficient documentation

## 2018-03-07 DIAGNOSIS — Z794 Long term (current) use of insulin: Secondary | ICD-10-CM | POA: Insufficient documentation

## 2018-03-13 MED FILL — FREESTYLE LIBRE 14 DAY SENS: 28 days supply | Qty: 2 | Fill #0

## 2018-04-07 MED FILL — FREESTYLE LIBRE 14 DAY SENS: 28 days supply | Qty: 2 | Fill #1

## 2018-04-07 MED FILL — TRULICITY 1.5 MG/0.5 ML PEN: 1.5 | 28 days supply | Qty: 2 | Fill #0

## 2018-04-07 MED FILL — HUMULIN R 500 UNITS/ML KWIK: 500 | 20 days supply | Qty: 6 | Fill #11

## 2018-04-23 MED FILL — NABUMETONE 750 MG TABLET: 750 | 90 days supply | Qty: 180 | Fill #1

## 2018-04-25 DIAGNOSIS — H5213 Myopia, bilateral: Secondary | ICD-10-CM | POA: Diagnosis not present

## 2018-04-28 MED FILL — traMADol HCL 50 MG TABS: 50 | 30 days supply | Qty: 60 | Fill #4

## 2018-04-28 MED FILL — HUMULIN R 500 UNITS/ML KWIK: 500 | 25 days supply | Qty: 6 | Fill #0

## 2018-04-29 MED FILL — TRULICITY 1.5 MG/0.5 ML PEN: 1.5 | 28 days supply | Qty: 2 | Fill #1

## 2018-06-02 MED FILL — TRULICITY 1.5 MG/0.5 ML PEN: 1.5 | 28 days supply | Qty: 2 | Fill #2

## 2018-06-03 ENCOUNTER — Other Ambulatory Visit: Payer: Self-pay | Admitting: Internal Medicine

## 2018-06-03 DIAGNOSIS — Z1231 Encounter for screening mammogram for malignant neoplasm of breast: Secondary | ICD-10-CM

## 2018-06-03 MED FILL — LOSARTAN POTASSIUM 25 MG TA: 25 | 90 days supply | Qty: 90 | Fill #0

## 2018-06-03 MED FILL — ATORVASTATIN 40 MG TABLET: 40 | 90 days supply | Qty: 90 | Fill #0

## 2018-06-03 MED FILL — DULoxetine HCL 60 MG CPEP: 60 | 90 days supply | Qty: 90 | Fill #0

## 2018-06-03 MED FILL — OMEPRAZOLE 40 MG CPDR: 40 | 90 days supply | Qty: 180 | Fill #0

## 2018-06-03 MED FILL — GABAPENTIN 300 MG CAPSULE: 300 | 90 days supply | Qty: 270 | Fill #0

## 2018-06-03 MED FILL — METOPROLOL SUCCINATE ER 25: 25 | 90 days supply | Qty: 90 | Fill #0

## 2018-06-04 MED FILL — PIOGLITAZONE HCL 30 MG TAB: 30 | 90 days supply | Qty: 90 | Fill #0

## 2018-06-25 MED FILL — HUMULIN R 500 UNITS/ML KWIK: 500 | 25 days supply | Qty: 6 | Fill #1 | Status: TO

## 2018-06-25 MED FILL — TRULICITY 1.5 MG/0.5 ML PEN: 1.5 | 28 days supply | Qty: 2 | Fill #3

## 2018-07-21 MED FILL — TRULICITY 1.5 MG/0.5 ML PEN: 1.5 | 28 days supply | Qty: 2 | Fill #4 | Status: TO

## 2018-07-28 MED FILL — NABUMETONE 750 MG TABS: 750 | 90 days supply | Qty: 180 | Fill #0 | Status: TO

## 2018-07-29 MED FILL — HYDROCODON-APAP 5-325: 5-325 | 5 days supply | Qty: 20 | Fill #0

## 2018-07-30 ENCOUNTER — Other Ambulatory Visit: Payer: Self-pay | Admitting: Neurosurgery

## 2018-07-30 DIAGNOSIS — M5136 Other intervertebral disc degeneration, lumbar region: Secondary | ICD-10-CM

## 2018-08-04 ENCOUNTER — Ambulatory Visit (INDEPENDENT_AMBULATORY_CARE_PROVIDER_SITE_OTHER): Payer: No Typology Code available for payment source | Admitting: Obstetrics & Gynecology

## 2018-08-04 ENCOUNTER — Other Ambulatory Visit: Payer: Self-pay

## 2018-08-04 ENCOUNTER — Encounter: Payer: Self-pay | Admitting: Obstetrics & Gynecology

## 2018-08-04 ENCOUNTER — Other Ambulatory Visit (HOSPITAL_COMMUNITY)
Admission: RE | Admit: 2018-08-04 | Discharge: 2018-08-04 | Disposition: A | Discharge status: Home / Self Care | Payer: No Typology Code available for payment source | Source: Ambulatory Visit | Attending: Obstetrics & Gynecology | Admitting: Obstetrics & Gynecology

## 2018-08-04 VITALS — BP 120/80 | Ht 63.0 in | Wt 254.0 lb

## 2018-08-04 DIAGNOSIS — Z01419 Encounter for gynecological examination (general) (routine) without abnormal findings: Secondary | ICD-10-CM

## 2018-08-04 DIAGNOSIS — Z124 Encounter for screening for malignant neoplasm of cervix: Secondary | ICD-10-CM | POA: Diagnosis not present

## 2018-08-04 DIAGNOSIS — Z0000 Encounter for general adult medical examination without abnormal findings: Secondary | ICD-10-CM

## 2018-08-04 DIAGNOSIS — Z1211 Encounter for screening for malignant neoplasm of colon: Secondary | ICD-10-CM

## 2018-08-04 NOTE — Patient Instructions (Signed)
PAP every year Mammogram every year Colonoscopy every 10 years Labs yearly (with PCP)   

## 2018-08-04 NOTE — Progress Notes (Signed)
HPI:      [name] is a 58 y.o. G1P1001 who LMP was in the past, she presents today for her annual examination.  The patient has no complaints today. The patient is sexually active. Herlast pap: approximate date 2019 and was normal and prior LEEP 2015; and last mammogram: approximate date 2019 and was normal.  The patient does perform self breast exams.  There is no notable family history of breast or ovarian cancer in her family. The patient is not taking hormone replacement therapy. Patient denies post-menopausal vaginal bleeding.   The patient has regular exercise: yes. The patient denies current symptoms of depression.    GYN Hx: Last Colonoscopy:6 years ago. Normal.  Last DEXA: never ago.    PMHx: Past Medical History:  Diagnosis Date  . Arthritis   . Asthma without status asthmaticus 08/02/2015   Overview:  mild, not requiring treatment   . Cervical spinal stenosis   . Degeneration of intervertebral disc of cervical region 04/28/2015  . Depression   . Diabetes ([place])   . GERD (gastroesophageal reflux disease)   . Headache, migraine 08/02/2015  . Heartburn   . History of kidney stones   . HLD (hyperlipidemia)   . Kidney stones 07/18/2015  . Lumbar stenosis   . Pancreatitis   . Radiculopathy of cervical region   . Sleep apnea    does not wear cpap    Past Surgical History:  Procedure Laterality Date  . ANTERIOR CERVICAL DECOMP/DISCECTOMY FUSION N/A 04/20/2016   Procedure: CERVICAL THREE-FOUR  ANTERIOR CERVICAL DECOMPRESSION/DISCECTOMY/FUSION WITH PARTIAL REMOVAL OF PLATE AT CERVICAL FOUR;  Surgeon: Leeroy Cha, MD;  Location: [facility];  Service: Neurosurgery;  Laterality: N/A;  . [place]  . CERVICAL BIOPSY  W/ LOOP ELECTRODE EXCISION  08/12/2013  . CERVICAL FUSION  2009  . COLONOSCOPY    . COLPOSCOPY  03/26/2013  . CYSTOSCOPY WITH STENT PLACEMENT Right 08/24/2015   Procedure: CYSTOSCOPY WITH STENT PLACEMENT;  Surgeon: Cleon Gustin, MD;   [facility];  Service: Urology;  Laterality: Right;  . NEPHROLITHOTOMY Right 08/24/2015   Procedure: NEPHROLITHOTOMY PERCUTANEOUS WITH SURGEON TO GET ACCESS;  Surgeon: Cleon Gustin, MD;  [facility];  Service: Urology;  Laterality: Right;  . TUBAL LIGATION     Family History  Problem Relation Age of Onset  . Heart failure Mother   . Heart attack Father   . Colon cancer Maternal Grandfather 83  . Kidney disease Neg Hx   . Bladder Cancer Neg Hx    Social History   Tobacco Use  . Smoking status: Never Smoker  . Smokeless tobacco: Never Used  Substance Use Topics  . Alcohol use: Yes    Alcohol/week: 0.0 standard drinks    Comment: occasional  . Drug use: No    Current Outpatient Medications:  .  atorvastatin (LIPITOR) 40 MG tablet, Take 40 mg by mouth daily at 6 PM., Disp: , Rfl:  .  docusate sodium (COLACE) 100 MG capsule, Take 1 capsule (100 mg total) by mouth 2 (two) times daily., Disp: 60 capsule, Rfl: 0 .  DULoxetine (CYMBALTA) 60 MG capsule, Take 60 mg by mouth at bedtime. , Disp: , Rfl:  .  gabapentin (NEURONTIN) 300 MG capsule, Take 300-600 mg by mouth 2 (two) times daily. 300 mg in the morning and 600 mg at night, Disp: , Rfl:  .  Insulin Glargine-Lixisenatide 100-33 UNT-MCG/ML SOPN, Inject 60 Units into the skin daily., Disp: ,  Rfl:  .  losartan (COZAAR) 25 MG tablet, Take 25 mg by mouth every morning. , Disp: , Rfl:  .  metFORMIN (GLUCOPHAGE-XR) 500 MG 24 hr tablet, Take 500 mg by mouth at bedtime. , Disp: , Rfl:  .  metoprolol succinate (TOPROL-XL) 25 MG 24 hr tablet, Take 25 mg by mouth every morning. , Disp: , Rfl:  .  omeprazole (PRILOSEC) 40 MG capsule, Take 40 mg by mouth 2 (two) times daily. , Disp: , Rfl:  .  ondansetron (ZOFRAN) 4 MG tablet, Take 1 tablet (4 mg total) by mouth every 8 (eight) hours as needed for nausea or vomiting., Disp: 20 tablet, Rfl: 0 .  pioglitazone (ACTOS) 15 MG tablet, Take 15 mg by mouth daily., Disp: , Rfl:  .  UNIFINE  PENTIPS 31G X 5 MM MISC, , Disp: , Rfl: 12 .  cyclobenzaprine (FLEXERIL) 5 MG tablet, Take 1 tablet (5 mg total) by mouth 3 (three) times daily as needed for muscle spasms. (Patient not taking: Reported on 08/04/2018), Disp: 50 tablet, Rfl: 1 .  pioglitazone (ACTOS) 30 MG tablet, Take 30 mg by mouth daily., Disp: , Rfl:  .  Pseudoephedrine HCl (SUDAFED 12 HOUR PO), Take 1 tablet by mouth daily as needed (sinus congestion). Sinus headache , Disp: , Rfl:  .  traMADol (ULTRAM) 50 MG tablet, Take 50-100 mg by mouth 2 (two) times daily as needed for moderate pain (depends on pain level if 50-100 mg). , Disp: , Rfl:  .  zolpidem (AMBIEN) 5 MG tablet, Take 5 mg by mouth at bedtime as needed for sleep. Reported on 08/30/2015, Disp: , Rfl:  Allergies: Biaxin [clarithromycin]  Review of Systems  Constitutional: Negative for chills, fever and malaise/fatigue.  HENT: Negative for congestion, sinus pain and sore throat.   Eyes: Negative for blurred vision and pain.  Respiratory: Negative for cough and wheezing.   Cardiovascular: Negative for chest pain and leg swelling.  Gastrointestinal: Negative for abdominal pain, constipation, diarrhea, heartburn, nausea and vomiting.  Genitourinary: Negative for dysuria, frequency, hematuria and urgency.  Musculoskeletal: Negative for back pain, joint pain, myalgias and neck pain.  Skin: Negative for itching and rash.  Neurological: Negative for dizziness, tremors and weakness.  Endo/Heme/Allergies: Does not bruise/bleed easily.  Psychiatric/Behavioral: Negative for depression. The patient is not nervous/anxious and does not have insomnia.     Objective: BP 120/80   Ht 5\' 3"  (1.6 m)   Wt 254 lb (115.2 kg)   BMI 44.99 kg/m   Filed Weights   08/04/18 0819  Weight: 254 lb (115.2 kg)   Body mass index is 44.99 kg/m. Physical Exam Constitutional:      General: She is not in acute distress.    Appearance: She is well-developed.  Genitourinary:     Pelvic exam  was performed with patient supine.     Vagina, uterus and rectum normal.     No lesions in the vagina.     No vaginal bleeding.     No cervical motion tenderness, friability, lesion or polyp.     Uterus is mobile.     Uterus is not enlarged.     No uterine mass detected.    Uterus is midaxial.     No right or left adnexal mass present.     Right adnexa not tender.     Left adnexa not tender.  HENT:     Head: Normocephalic and atraumatic. No laceration.     Right Ear: Hearing normal.  Left Ear: Hearing normal.     Mouth/Throat:     Pharynx: Uvula midline.  Eyes:     Pupils: Pupils are equal, round, and reactive to light.  Neck:     Musculoskeletal: Normal range of motion and neck supple.     Thyroid: No thyromegaly.  Cardiovascular:     Rate and Rhythm: Normal rate and regular rhythm.     Heart sounds: No murmur. No friction rub. No gallop.   Pulmonary:     Effort: Pulmonary effort is normal. No respiratory distress.     Breath sounds: Normal breath sounds. No wheezing.  Chest:     Breasts:        Right: No mass, skin change or tenderness.        Left: No mass, skin change or tenderness.  Abdominal:     General: Bowel sounds are normal. There is no distension.     Palpations: Abdomen is soft.     Tenderness: There is no abdominal tenderness. There is no rebound.  Musculoskeletal: Normal range of motion.  Neurological:     Mental Status: She is alert and oriented to person, place, and time.     Cranial Nerves: No cranial nerve deficit.  Skin:    General: Skin is warm and dry.  Psychiatric:        Judgment: Judgment normal.  Vitals signs reviewed.     Assessment: Annual Exam 1. Screen for colon cancer   2. Screening for cervical cancer   3. Annual physical exam     Plan:            1.  Cervical Screening-  Pap smear done today  2. Breast screening- Exam annually and mammogram scheduled (scheduled this week)  3. Colonoscopy every 10 years, Hemoccult testing  after age 18  4. Labs managed by PCP  5. Counseling for hormonal therapy: none              6. FRAX - FRAX score for assessing the 10 year probability for fracture calculated and discussed today.  Based on age and score today, DEXA is not currently scheduled.    F/U  Return in about 1 year (around 08/04/2019) for Annual.  Barnett Applebaum, MD, [facility], [facility] 08/04/2018  8:42 AM

## 2018-08-07 ENCOUNTER — Ambulatory Visit
Admission: RE | Admit: 2018-08-07 | Discharge: 2018-08-07 | Disposition: A | Discharge status: Home / Self Care | Payer: No Typology Code available for payment source | Source: Ambulatory Visit | Attending: Neurosurgery | Admitting: Neurosurgery

## 2018-08-07 ENCOUNTER — Other Ambulatory Visit: Payer: Self-pay

## 2018-08-07 DIAGNOSIS — M5136 Other intervertebral disc degeneration, lumbar region: Secondary | ICD-10-CM | POA: Diagnosis present

## 2018-08-07 LAB — CYTOLOGY - PAP
Adequacy: ABSENT
Diagnosis: NEGATIVE

## 2018-08-07 LAB — POCT I-STAT CREATININE: Creatinine, Ser: 0.9 mg/dL (ref 0.44–1.00)

## 2018-08-07 MED ORDER — GADOBUTROL 1 MMOL/ML IV SOLN
10.0000 mL | Freq: Once | INTRAVENOUS | Status: AC | PRN
  Administered 2018-08-07: 10 mL via INTRAVENOUS

## 2018-08-20 MED FILL — LOSARTAN POTASSIUM 25 MG TA: 25 | 90 days supply | Qty: 90 | Fill #0

## 2018-08-20 MED FILL — DULOXETINE HCL 60 MG CPEP: 60 | 90 days supply | Qty: 90 | Fill #0

## 2018-08-20 MED FILL — METOPROLOL SUCCINATE ER 25: 25 | 90 days supply | Qty: 90 | Fill #0

## 2018-08-20 MED FILL — OMEPRAZOLE 40 MG CPDR: 40 | 90 days supply | Qty: 180 | Fill #0

## 2018-08-20 MED FILL — GABAPENTIN 300 MG CAPSULE: 300 | 90 days supply | Qty: 270 | Fill #0

## 2018-08-20 MED FILL — PIOGLITAZONE HCL 30 MG TAB: 30 | 90 days supply | Qty: 90 | Fill #0

## 2018-08-20 MED FILL — TRULICITY 1.5 MG/0.5 ML PEN: 1.5 | 28 days supply | Qty: 2 | Fill #0

## 2018-08-20 MED FILL — ATORVASTATIN 40 MG TABLET: 40 | 90 days supply | Qty: 90 | Fill #0

## 2018-08-20 MED FILL — HUMULIN R 500 UNITS/ML KWIK: 500 | 25 days supply | Qty: 6 | Fill #0

## 2018-09-10 MED FILL — traMADol HCL 50 MG TABS: 50 | 5 days supply | Qty: 40 | Fill #0

## 2018-09-12 ENCOUNTER — Other Ambulatory Visit: Payer: Self-pay | Admitting: Neurosurgery

## 2018-09-12 MED FILL — HYDROCODON-APAP 5-325: 5-325 | 5 days supply | Qty: 20 | Fill #0

## 2018-09-22 MED FILL — TRULICITY 1.5 MG/0.5 ML PEN: 1.5 | 28 days supply | Qty: 2 | Fill #1

## 2018-09-22 MED FILL — HUMULIN R 500 UNITS/ML KWIK: 500 | 25 days supply | Qty: 6 | Fill #1

## 2018-10-26 MED FILL — TRULICITY 1.5 MG/0.5 ML PEN: 1.5 | 28 days supply | Qty: 2 | Fill #2

## 2018-10-28 MED FILL — traMADol HCL 50 MG TABS: 50 | 30 days supply | Qty: 60 | Fill #0

## 2018-10-28 MED FILL — NABUMETONE 750 MG TABS: 750 | 90 days supply | Qty: 180 | Fill #0

## 2018-11-12 NOTE — Pre-Procedure Instructions (Signed)
[place], [place] - [address] [facility]. [address] [place] [address] Phone: [phone] Fax: [phone]      Your procedure is scheduled on  Monday 11-17-18  Report to [facility] Main Entrance "A" at 0800 A.M., and check in at the Admitting office.  Call this number if you have problems the morning of surgery:  [phone]  Call [phone] if you have any questions prior to your surgery date Monday-Friday 8am-4pm    Remember:  Do not eat or drink after midnight.   Take these medicines the morning of surgery with A SIP OF WATER : gabapentin (NEURONTIN) metoprolol succinate (TOPROL-XL) omeprazole (PRILOSEC) ondansetron (ZOFRAN)as needed rizatriptan (MAXALT) as needed traMADol (ULTRAM)as needed  7 days prior to surgery STOP taking any Aspirin (unless otherwise instructed by your surgeon), Aleve, Naproxen, Ibuprofen, Motrin, Advil, Goody's, BC's, all herbal medications, fish oil, and all vitamins,NABUMETONE   WHAT DO I DO ABOUT MY DIABETES MEDICATION?   Marland Kitchen Do not take oral diabetes medicines (pills) the morning of surgery including ACTOS  . THE NIGHT BEFORE SURGERY, do not take insulin regular human CONCENTRATED (HUMULIN  ).You may take the usual dose before meals.   . THE MORNING OF SURGERY, do not take HUMULIN.  Marland Kitchen The day of surgery, do not take other diabetes injectables, Trulicity (dulaglutide).  . If your CBG is greater than 220 mg/dL, you may take  of your sliding scale (correction) dose of insulin.   How to Manage Your Diabetes Before and After Surgery  Why is it important to control my blood sugar before and after surgery? . Improving blood sugar levels before and after surgery helps healing and can limit problems. . A way of improving blood sugar control is eating a healthy diet by: o  Eating less sugar and carbohydrates o  Increasing activity/exercise o  Talking with your doctor about reaching your  blood sugar goals . High blood sugars (greater than 180 mg/dL) can raise your risk of infections and slow your recovery, so you will need to focus on controlling your diabetes during the weeks before surgery. . Make sure that the doctor who takes care of your diabetes knows about your planned surgery including the date and location.  How do I manage my blood sugar before surgery? . Check your blood sugar at least 4 times a day, starting 2 days before surgery, to make sure that the level is not too high or low. o Check your blood sugar the morning of your surgery when you wake up and every 2 hours until you get to the Short Stay unit. . If your blood sugar is less than 70 mg/dL, you will need to treat for low blood sugar: o Do not take insulin. o Treat a low blood sugar (less than 70 mg/dL) with  cup of clear juice (cranberry or apple), 4 glucose tablets, OR glucose gel. o Recheck blood sugar in 15 minutes after treatment (to make sure it is greater than 70 mg/dL). If your blood sugar is not greater than 70 mg/dL on recheck, call [phone] for further instructions. . Report your blood sugar to the short stay nurse when you get to Short Stay.  . If you are admitted to the hospital after surgery: o Your blood sugar will be checked by the staff and you will probably be given insulin after surgery (instead of oral diabetes medicines) to make sure you have good blood sugar levels. o The goal for  blood sugar control after surgery is 80-180 mg/dL.   The Morning of Surgery  Do not wear jewelry, make-up or nail polish.  Do not wear lotions, powders, or perfumes, or deodorant  Do not shave 48 hours prior to surgery.    Do not bring valuables to the hospital.  [facility] is not responsible for any belongings or valuables.  If you are a smoker, DO NOT Smoke 24 hours prior to surgery IF you wear a CPAP at night please bring your mask, tubing, and machine the morning of surgery   Remember that you  must have someone to transport you home after your surgery, and remain with you for 24 hours if you are discharged the same day.   Contacts, glasses, hearing aids, dentures or bridgework may not be worn into surgery.    Leave your suitcase in the car.  After surgery it may be brought to your room.  For patients admitted to the hospital, discharge time will be determined by your treatment team.  Patients discharged the day of surgery will not be allowed to drive home.    Special instructions:   [facility]- Preparing For Surgery  Before surgery, you can play an important role. Because skin is not sterile, your skin needs to be as free of germs as possible. You can reduce the number of germs on your skin by washing with CHG (chlorahexidine gluconate) Soap before surgery.  CHG is an antiseptic cleaner which kills germs and bonds with the skin to continue killing germs even after washing.    Oral Hygiene is also important to reduce your risk of infection.  Remember - BRUSH YOUR TEETH THE MORNING OF SURGERY WITH YOUR REGULAR TOOTHPASTE  Please do not use if you have an allergy to CHG or antibacterial soaps. If your skin becomes reddened/irritated stop using the CHG.  Do not shave (including legs and underarms) for at least 48 hours prior to first CHG shower. It is OK to shave your face.  Please follow these instructions carefully.   1. Shower the NIGHT BEFORE SURGERY and the MORNING OF SURGERY with CHG Soap.   2. If you chose to wash your hair, wash your hair first as usual with your normal shampoo.  3. After you shampoo, rinse your hair and body thoroughly to remove the shampoo.  4. Use CHG as you would any other liquid soap. You can apply CHG directly to the skin and wash gently with a scrungie or a clean washcloth.   5. Apply the CHG Soap to your body ONLY FROM THE NECK DOWN.  Do not use on open wounds or open sores. Avoid contact with your eyes, ears, mouth and genitals (private  parts). Wash Face and genitals (private parts)  with your normal soap.   6. Wash thoroughly, paying special attention to the area where your surgery will be performed.  7. Thoroughly rinse your body with warm water from the neck down.  8. DO NOT shower/wash with your normal soap after using and rinsing off the CHG Soap.  9. Pat yourself dry with a CLEAN TOWEL.  10. Wear CLEAN PAJAMAS to bed the night before surgery, wear comfortable clothes the morning of surgery  11. Place CLEAN SHEETS on your bed the night of your first shower and DO NOT SLEEP WITH PETS.    Day of Surgery:  Do not apply any deodorants/lotions.  Please wear clean clothes to the hospital/surgery center.   Remember to brush your teeth WITH  YOUR REGULAR TOOTHPASTE.   Please read over the following fact sheets that you were given.

## 2018-11-13 ENCOUNTER — Other Ambulatory Visit: Payer: Self-pay | Admitting: Neurosurgery

## 2018-11-13 ENCOUNTER — Encounter (HOSPITAL_COMMUNITY)
Admission: RE | Admit: 2018-11-13 | Discharge: 2018-11-13 | Disposition: A | Discharge status: Home / Self Care | Payer: No Typology Code available for payment source | Source: Ambulatory Visit | Attending: Neurosurgery | Admitting: Neurosurgery

## 2018-11-13 ENCOUNTER — Other Ambulatory Visit (HOSPITAL_COMMUNITY)
Admission: RE | Admit: 2018-11-13 | Discharge: 2018-11-13 | Disposition: A | Discharge status: Home / Self Care | Payer: No Typology Code available for payment source | Source: Ambulatory Visit | Attending: Neurosurgery | Admitting: Neurosurgery

## 2018-11-13 ENCOUNTER — Encounter (HOSPITAL_COMMUNITY): Payer: Self-pay

## 2018-11-13 ENCOUNTER — Other Ambulatory Visit: Payer: Self-pay

## 2018-11-13 DIAGNOSIS — Z01812 Encounter for preprocedural laboratory examination: Secondary | ICD-10-CM | POA: Insufficient documentation

## 2018-11-13 DIAGNOSIS — Z1159 Encounter for screening for other viral diseases: Secondary | ICD-10-CM | POA: Insufficient documentation

## 2018-11-13 LAB — BASIC METABOLIC PANEL
Anion gap: 7 (ref 5–15)
BUN: 16 mg/dL (ref 6–20)
CO2: 27 mmol/L (ref 22–32)
Calcium: 9.1 mg/dL (ref 8.9–10.3)
Chloride: 105 mmol/L (ref 98–111)
Creatinine, Ser: 0.86 mg/dL (ref 0.44–1.00)
GFR calc Af Amer: >60 mL/min (ref >60)
GFR calc non Af Amer: >60 mL/min (ref >60)
Glucose, Bld: 204 mg/dL — ABNORMAL HIGH (ref 70–99)
Potassium: 4.5 mmol/L (ref 3.5–5.1)
Sodium: 139 mmol/L (ref 135–145)

## 2018-11-13 LAB — CBC
HCT: 40.9 % (ref 36.0–46.0)
Hemoglobin: 12.9 g/dL (ref 12.0–15.0)
MCH: 31 pg (ref 26.0–34.0)
MCHC: 31.5 g/dL (ref 30.0–36.0)
MCV: 98.3 fL (ref 80.0–100.0)
Platelets: 314 10*3/uL (ref 150–400)
RBC: 4.16 MIL/uL (ref 3.87–5.11)
RDW: 13.2 % (ref 11.5–15.5)
WBC: 8.2 10*3/uL (ref 4.0–10.5)
nRBC: 0 % (ref 0.0–0.2)

## 2018-11-13 LAB — TYPE AND SCREEN
ABO/RH(D): A POS
Antibody Screen: NEGATIVE

## 2018-11-13 LAB — SURGICAL PCR SCREEN
MRSA, PCR: NEGATIVE
Staphylococcus aureus: NEGATIVE

## 2018-11-13 LAB — SARS CORONAVIRUS 2 (TAT 6-24 HRS): SARS Coronavirus 2: NEGATIVE

## 2018-11-13 NOTE — Progress Notes (Signed)
PCP - [facility]  Chest x-ray - N/A EKG - N/A Stress Test - 2017 [facility])  DM - Type 2 Fasting Blood Sugar - 100-130  Anesthesia review: A1C 8.6 ([facility] - Care Everywhere)  Pt states she does not have high BP.   She takes Metoprolol for migraines and Losartan for kidneys (due to DM).    Patient denies shortness of breath, fever, cough and chest pain at PAT appointment   Patient verbalized understanding of instructions that were given to them at the PAT appointment. Patient was also instructed that they will need to review over the PAT instructions again at home before surgery.

## 2018-11-14 NOTE — Anesthesia Preprocedure Evaluation (Addendum)
Anesthesia Evaluation  Patient identified by MRN, date of birth, ID band Patient awake    Reviewed: Allergy & Precautions, NPO status , Patient's Chart, lab work & pertinent test results  History of Anesthesia Complications Negative for: history of anesthetic complications  Airway Mallampati: II  TM Distance: >3 FB Neck ROM: Full    Dental  (+) Dental Advisory Given   Pulmonary sleep apnea and Continuous Positive Airway Pressure Ventilation ,  11/13/2018 SARS coronavirus NEG   breath sounds clear to auscultation       Cardiovascular hypertension, Pt. on medications and Pt. on home beta blockers (-) angina Rhythm:Regular Rate:Normal     Neuro/Psych  Headaches, Depression    GI/Hepatic Neg liver ROS, GERD  Medicated and Controlled,  Endo/Other  diabetes (glu 181), Insulin Dependent  Renal/GU negative Renal ROS     Musculoskeletal   Abdominal (+) + obese,   Peds  Hematology negative hematology ROS (+)   Anesthesia Other Findings   Reproductive/Obstetrics                           Anesthesia Physical Anesthesia Plan  ASA: III  Anesthesia Plan: General   Post-op Pain Management:    Induction: Intravenous  PONV Risk Score and Plan: 4 or greater and Dexamethasone, Ondansetron and Scopolamine patch - Pre-op  Airway Management Planned: Oral ETT  Additional Equipment:   Intra-op Plan:   Post-operative Plan: Extubation in OR  Informed Consent: I have reviewed the patients History and Physical, chart, labs and discussed the procedure including the risks, benefits and alternatives for the proposed anesthesia with the patient or authorized representative who has indicated his/her understanding and acceptance.     Dental advisory given  Plan Discussed with: CRNA and Surgeon  Anesthesia Plan Comments: (PAT note written 11/14/2018 by Myra Gianotti, PA-C. EKG day of surgery.  )        Anesthesia Quick Evaluation

## 2018-11-14 NOTE — Progress Notes (Signed)
Anesthesia Chart Review:  Case: [id number] Date/Time: 11/17/18 0945   Procedure: EXPLORATION OF LUMBAR FUSION, LUMBAR 2- LUMBAR 3 DECOMPRESSIVE LAMINECTOMY, INSTRUMENTATION W/PEDICLE SCREWS AND ROD,PLACE INTERBODY PROSTHESIS, POSTERIOR LATERAL ARTHRODESIS (N/A ) - EXPLORATION OF LUMBAR FUSION, LUMBAR 2- LUMBAR 3 DECOMPRESSIVE LAMINECTOMY, INSTRUMENTATION W/PEDICLE SCREWS AND ROD,PLACE INTERBODY PROSTHESIS, POSTERIOR LATERAL ARTHRODESIS   Anesthesia type: General   Pre-op diagnosis: DEGENERATIVE DISC DISEASE, LUMBAR   Location: [place] OR ROOM 19 / [place] [facility]   Surgeon: Newman Pies, MD      DISCUSSION: Patient is a 58 year old female scheduled for the above procedure.  History includes never smoker, DM2, HLD, asthma, GERD, migraines, depression, OSA (intolerant of CPAP), pancreatitis, ACDF (C4-7 2009, C3-4 04/20/16),  L2-3 laminotomy/foraminotomies and L3-4/4-5 fusion 06/18/16. BMI is consistent with morbid obesity.    She reports that she is on metoprolol for migraine prevention and losartan for renal protective purposes given DM history.   A1c 8.6 on 10/27/18 [facility], [facility]). A1c result reported to [facility] at Dr. Arnoldo Morale' office. She will get a fasting CBG on arrival.  11/13/18 surgical COVID test was negative. It does not look like she got a pre-surgical EKG at her PAT visit, so this will need to be done on arrival. She had a negative stress echo 12/2016.   VS: BP 137/77   Pulse 73   Temp 36.9 C   Resp 20   Ht 5\' 3"  (1.6 m)   SpO2 99%   BMI 44.99 kg/m    PROVIDERS: Adin Hector, MD is PCP (Care Everywhere) - Lucilla Lame, MD is endocrinologist [facility], [facility]). Last visit 10/27/18. A1c 8.6%. She reported often forgetting to take her pre-supper U500, but otherwise sounded like she was compliant with medication regimen (pioglitazone 30 mg daily, Trulicity 1.5 mg weekly, Humulin R U500 [takes 45 units before breakfast, 40 units before lunch, and 35 units  before supper]).   LABS: Preoperative labs noted. A1c 8.6% on 10/27/18 ([facility]).  (all labs ordered are listed, but only abnormal results are displayed)  Labs Reviewed  BASIC METABOLIC PANEL - Abnormal; Notable for the following components:      Result Value   Glucose, Bld 204 (*)    All other components within normal limits  SURGICAL PCR SCREEN  CBC  TYPE AND SCREEN    IMAGES: MRI L-spine 08/17/18: IMPRESSION: - Satisfactory appearance in the decompression, diskectomy and fusion segment from L3-L5. - Worsening of adjacent segment degenerative disease at L2-3. Multifactorial spinal stenosis likely to be symptomatic. This appearance could worsen with standing or flexion. - L5-S1 facet osteoarthritis but without visible slippage or encroachment upon the neural structures.   EKG: For unclear reasons, EKG was not done at her PAT visit, so she will need an EKG done on the day of surgery.    CV: Stress echo 01/07/17: INTERPRETATION Normal Stress Echocardiogram NORMAL RIGHT VENTRICULAR SYSTOLIC FUNCTION NO VALVULAR STENOSIS NOTED   Past Medical History:  Diagnosis Date  . Arthritis   . Asthma without status asthmaticus 08/02/2015   Overview:  mild, not requiring treatment   . Cervical spinal stenosis   . Degeneration of intervertebral disc of cervical region 04/28/2015  . Depression   . Diabetes ([place])   . GERD (gastroesophageal reflux disease)   . Headache, migraine 08/02/2015  . Heartburn   . History of kidney stones   . HLD (hyperlipidemia)   . Kidney stones 07/18/2015  . Lumbar stenosis   . Pancreatitis   . Radiculopathy  of cervical region   . Sleep apnea    does not wear cpap     Past Surgical History:  Procedure Laterality Date  . ANTERIOR CERVICAL DECOMP/DISCECTOMY FUSION N/A 04/20/2016   Procedure: CERVICAL THREE-FOUR  ANTERIOR CERVICAL DECOMPRESSION/DISCECTOMY/FUSION WITH PARTIAL REMOVAL OF PLATE AT CERVICAL FOUR;  Surgeon: Leeroy Cha, MD;  Location: [place];  Service: Neurosurgery;  Laterality: N/A;  . [place]  . CERVICAL BIOPSY  W/ LOOP ELECTRODE EXCISION  08/12/2013  . CERVICAL FUSION  2009  . COLONOSCOPY    . COLPOSCOPY  03/26/2013  . CYSTOSCOPY WITH STENT PLACEMENT Right 08/24/2015   Procedure: CYSTOSCOPY WITH STENT PLACEMENT;  Surgeon: Cleon Gustin, MD;  Location: [facility];  Service: Urology;  Laterality: Right;  . NEPHROLITHOTOMY Right 08/24/2015   Procedure: NEPHROLITHOTOMY PERCUTANEOUS WITH SURGEON TO GET ACCESS;  Surgeon: Cleon Gustin, MD;  Location: [facility];  Service: Urology;  Laterality: Right;  . TUBAL LIGATION      MEDICATIONS: . atorvastatin (LIPITOR) 40 MG tablet  . Dulaglutide (TRULICITY) 1.5 TD/9.7CB SOPN  . DULoxetine (CYMBALTA) 60 MG capsule  . gabapentin (NEURONTIN) 300 MG capsule  . insulin regular human CONCENTRATED (HUMULIN R U-500 KWIKPEN) 500 UNIT/ML kwikpen  . losartan (COZAAR) 25 MG tablet  . metoprolol succinate (TOPROL-XL) 25 MG 24 hr tablet  . nabumetone (RELAFEN) 750 MG tablet  . omeprazole (PRILOSEC) 40 MG capsule  . ondansetron (ZOFRAN) 4 MG tablet  . pioglitazone (ACTOS) 15 MG tablet  . rizatriptan (MAXALT) 10 MG tablet  . traMADol (ULTRAM) 50 MG tablet  . UNIFINE PENTIPS 31G X 5 MM MISC  . zolpidem (AMBIEN) 5 MG tablet   No current facility-administered medications for this encounter.     Myra Gianotti, PA-C Surgical Short Stay/Anesthesiology [facility] Phone [phone] [facility] Phone [phone] 11/14/2018 11:02 AM

## 2018-11-17 ENCOUNTER — Encounter (HOSPITAL_COMMUNITY): Payer: Self-pay

## 2018-11-17 ENCOUNTER — Encounter (HOSPITAL_COMMUNITY)
Admission: RE | Disposition: A | Discharge status: Home / Self Care | Payer: Self-pay | Source: Home / Self Care | Attending: Neurosurgery

## 2018-11-17 ENCOUNTER — Inpatient Hospital Stay (HOSPITAL_COMMUNITY): Payer: No Typology Code available for payment source | Admitting: Certified Registered Nurse Anesthetist

## 2018-11-17 ENCOUNTER — Inpatient Hospital Stay (HOSPITAL_COMMUNITY): Payer: No Typology Code available for payment source | Admitting: Vascular Surgery

## 2018-11-17 ENCOUNTER — Inpatient Hospital Stay (HOSPITAL_COMMUNITY)
Admission: RE | Admit: 2018-11-17 | Discharge: 2018-11-19 | DRG: 454 | Disposition: A | Discharge status: Home / Self Care | Payer: No Typology Code available for payment source | Attending: Neurosurgery | Admitting: Neurosurgery

## 2018-11-17 ENCOUNTER — Inpatient Hospital Stay (HOSPITAL_COMMUNITY): Payer: No Typology Code available for payment source

## 2018-11-17 ENCOUNTER — Other Ambulatory Visit: Payer: Self-pay

## 2018-11-17 DIAGNOSIS — Z800 Family history of malignant neoplasm of digestive organs: Secondary | ICD-10-CM | POA: Diagnosis not present

## 2018-11-17 DIAGNOSIS — Z9851 Tubal ligation status: Secondary | ICD-10-CM

## 2018-11-17 DIAGNOSIS — K219 Gastro-esophageal reflux disease without esophagitis: Secondary | ICD-10-CM | POA: Diagnosis present

## 2018-11-17 DIAGNOSIS — Z981 Arthrodesis status: Secondary | ICD-10-CM

## 2018-11-17 DIAGNOSIS — M48062 Spinal stenosis, lumbar region with neurogenic claudication: Secondary | ICD-10-CM | POA: Diagnosis present

## 2018-11-17 DIAGNOSIS — G4730 Sleep apnea, unspecified: Secondary | ICD-10-CM | POA: Diagnosis present

## 2018-11-17 DIAGNOSIS — Z881 Allergy status to other antibiotic agents status: Secondary | ICD-10-CM | POA: Diagnosis not present

## 2018-11-17 DIAGNOSIS — Z794 Long term (current) use of insulin: Secondary | ICD-10-CM

## 2018-11-17 DIAGNOSIS — Z79899 Other long term (current) drug therapy: Secondary | ICD-10-CM

## 2018-11-17 DIAGNOSIS — M1990 Unspecified osteoarthritis, unspecified site: Secondary | ICD-10-CM | POA: Diagnosis present

## 2018-11-17 DIAGNOSIS — E785 Hyperlipidemia, unspecified: Secondary | ICD-10-CM | POA: Diagnosis present

## 2018-11-17 DIAGNOSIS — Z419 Encounter for procedure for purposes other than remedying health state, unspecified: Secondary | ICD-10-CM

## 2018-11-17 DIAGNOSIS — Z87442 Personal history of urinary calculi: Secondary | ICD-10-CM | POA: Diagnosis not present

## 2018-11-17 DIAGNOSIS — M5116 Intervertebral disc disorders with radiculopathy, lumbar region: Secondary | ICD-10-CM | POA: Diagnosis present

## 2018-11-17 DIAGNOSIS — J45909 Unspecified asthma, uncomplicated: Secondary | ICD-10-CM | POA: Diagnosis present

## 2018-11-17 DIAGNOSIS — E119 Type 2 diabetes mellitus without complications: Secondary | ICD-10-CM | POA: Diagnosis present

## 2018-11-17 DIAGNOSIS — Z6841 Body Mass Index (BMI) 40.0 and over, adult: Secondary | ICD-10-CM

## 2018-11-17 DIAGNOSIS — E669 Obesity, unspecified: Secondary | ICD-10-CM | POA: Diagnosis present

## 2018-11-17 LAB — GLUCOSE, CAPILLARY
Glucose-Capillary: 181 mg/dL — ABNORMAL HIGH (ref 70–99)
Glucose-Capillary: 182 mg/dL — ABNORMAL HIGH (ref 70–99)
Glucose-Capillary: 219 mg/dL — ABNORMAL HIGH (ref 70–99)
Glucose-Capillary: 304 mg/dL — ABNORMAL HIGH (ref 70–99)
Glucose-Capillary: 372 mg/dL — ABNORMAL HIGH (ref 70–99)

## 2018-11-17 LAB — HEMOGLOBIN A1C
Hgb A1c MFr Bld: 8.6 % — ABNORMAL HIGH (ref 4.8–5.6)
Mean Plasma Glucose: 200.12 mg/dL

## 2018-11-17 SURGERY — POSTERIOR LUMBAR FUSION 1 LEVEL
Anesthesia: General | Site: Spine Lumbar

## 2018-11-17 MED ORDER — MEPERIDINE HCL 25 MG/ML IJ SOLN: 6.2500 mg | INTRAMUSCULAR | Status: DC | PRN

## 2018-11-17 MED ORDER — FENTANYL CITRATE (PF) 250 MCG/5ML IJ SOLN
INTRAMUSCULAR | Status: AC
  Filled 2018-11-17: qty 5

## 2018-11-17 MED ORDER — ROCURONIUM BROMIDE 50 MG/5ML IV SOSY
PREFILLED_SYRINGE | INTRAVENOUS | Status: DC | PRN
  Administered 2018-11-17 (×2): 20 mg via INTRAVENOUS
  Administered 2018-11-17: 60 mg via INTRAVENOUS
  Administered 2018-11-17: 20 mg via INTRAVENOUS

## 2018-11-17 MED ORDER — ACETAMINOPHEN 10 MG/ML IV SOLN
INTRAVENOUS | Status: DC | PRN
  Administered 2018-11-17: 1000 mg via INTRAVENOUS

## 2018-11-17 MED ORDER — MORPHINE SULFATE (PF) 4 MG/ML IV SOLN: 4.0000 mg | INTRAVENOUS | Status: DC | PRN

## 2018-11-17 MED ORDER — LOSARTAN POTASSIUM 25 MG PO TABS
25.0000 mg | ORAL_TABLET | Freq: Every morning | ORAL | Status: DC
  Administered 2018-11-18 – 2018-11-19 (×2): 25 mg via ORAL
  Filled 2018-11-17 (×2): qty 1

## 2018-11-17 MED ORDER — MIDAZOLAM HCL 2 MG/2ML IJ SOLN
INTRAMUSCULAR | Status: AC
  Filled 2018-11-17: qty 2

## 2018-11-17 MED ORDER — SCOPOLAMINE 1 MG/3DAYS TD PT72
MEDICATED_PATCH | TRANSDERMAL | Status: DC | PRN
  Administered 2018-11-17: 1 via TRANSDERMAL

## 2018-11-17 MED ORDER — SCOPOLAMINE 1 MG/3DAYS TD PT72
MEDICATED_PATCH | TRANSDERMAL | Status: AC
  Filled 2018-11-17: qty 1

## 2018-11-17 MED ORDER — BUPIVACAINE LIPOSOME 1.3 % IJ SUSP
INTRAMUSCULAR | Status: DC | PRN
  Administered 2018-11-17: 20 mL

## 2018-11-17 MED ORDER — INSULIN ASPART 100 UNIT/ML ~~LOC~~ SOLN
0.0000 [IU] | Freq: Every day | SUBCUTANEOUS | Status: DC
  Administered 2018-11-17: 5 [IU] via SUBCUTANEOUS
  Administered 2018-11-18: 4 [IU] via SUBCUTANEOUS

## 2018-11-17 MED ORDER — DEXAMETHASONE SODIUM PHOSPHATE 10 MG/ML IJ SOLN
INTRAMUSCULAR | Status: AC
  Filled 2018-11-17: qty 1

## 2018-11-17 MED ORDER — DULOXETINE HCL 30 MG PO CPEP
60.0000 mg | ORAL_CAPSULE | Freq: Every day | ORAL | Status: DC
  Administered 2018-11-17 – 2018-11-18 (×2): 60 mg via ORAL
  Filled 2018-11-17 (×2): qty 2

## 2018-11-17 MED ORDER — BACITRACIN ZINC 500 UNIT/GM EX OINT
TOPICAL_OINTMENT | CUTANEOUS | Status: DC | PRN
  Administered 2018-11-17: 1 via TOPICAL

## 2018-11-17 MED ORDER — ACETAMINOPHEN 650 MG RE SUPP: 650.0000 mg | RECTAL | Status: DC | PRN

## 2018-11-17 MED ORDER — HYDROMORPHONE HCL 1 MG/ML IJ SOLN
INTRAMUSCULAR | Status: AC
  Filled 2018-11-17: qty 1

## 2018-11-17 MED ORDER — VANCOMYCIN HCL 1000 MG IV SOLR
INTRAVENOUS | Status: AC
  Filled 2018-11-17: qty 1000

## 2018-11-17 MED ORDER — ZOLPIDEM TARTRATE 5 MG PO TABS: 5.0000 mg | ORAL_TABLET | Freq: Every evening | ORAL | Status: DC | PRN

## 2018-11-17 MED ORDER — SUMATRIPTAN SUCCINATE 50 MG PO TABS
50.0000 mg | ORAL_TABLET | ORAL | Status: DC | PRN
  Filled 2018-11-17: qty 1

## 2018-11-17 MED ORDER — ROCURONIUM BROMIDE 10 MG/ML (PF) SYRINGE
PREFILLED_SYRINGE | INTRAVENOUS | Status: AC
  Filled 2018-11-17: qty 10

## 2018-11-17 MED ORDER — CEFAZOLIN SODIUM-DEXTROSE 2-4 GM/100ML-% IV SOLN
2.0000 g | Freq: Three times a day (TID) | INTRAVENOUS | Status: AC
  Administered 2018-11-17 (×2): 2 g via INTRAVENOUS
  Filled 2018-11-17 (×2): qty 100

## 2018-11-17 MED ORDER — HYDROMORPHONE HCL 1 MG/ML IJ SOLN
0.2500 mg | INTRAMUSCULAR | Status: DC | PRN
  Administered 2018-11-17 (×2): 0.5 mg via INTRAVENOUS

## 2018-11-17 MED ORDER — FENTANYL CITRATE (PF) 100 MCG/2ML IJ SOLN
INTRAMUSCULAR | Status: DC | PRN
  Administered 2018-11-17 (×4): 50 ug via INTRAVENOUS
  Administered 2018-11-17: 25 ug via INTRAVENOUS
  Administered 2018-11-17: 100 ug via INTRAVENOUS

## 2018-11-17 MED ORDER — SODIUM CHLORIDE 0.9% FLUSH
3.0000 mL | Freq: Two times a day (BID) | INTRAVENOUS | Status: DC
  Administered 2018-11-17: 3 mL via INTRAVENOUS

## 2018-11-17 MED ORDER — VANCOMYCIN HCL 1 G IV SOLR
INTRAVENOUS | Status: DC | PRN
  Administered 2018-11-17: 1000 mg via TOPICAL

## 2018-11-17 MED ORDER — ACETAMINOPHEN 500 MG PO TABS
1000.0000 mg | ORAL_TABLET | Freq: Four times a day (QID) | ORAL | Status: AC
  Administered 2018-11-17 – 2018-11-18 (×4): 1000 mg via ORAL
  Filled 2018-11-17 (×4): qty 2

## 2018-11-17 MED ORDER — OXYCODONE HCL 5 MG PO TABS
10.0000 mg | ORAL_TABLET | ORAL | Status: DC | PRN
  Administered 2018-11-17 – 2018-11-19 (×11): 10 mg via ORAL
  Filled 2018-11-17 (×10): qty 2

## 2018-11-17 MED ORDER — CEFAZOLIN SODIUM-DEXTROSE 2-4 GM/100ML-% IV SOLN
INTRAVENOUS | Status: AC
  Filled 2018-11-17: qty 100

## 2018-11-17 MED ORDER — ATORVASTATIN CALCIUM 40 MG PO TABS
40.0000 mg | ORAL_TABLET | Freq: Every day | ORAL | Status: DC
  Administered 2018-11-17 – 2018-11-18 (×2): 40 mg via ORAL
  Filled 2018-11-17 (×2): qty 1

## 2018-11-17 MED ORDER — GABAPENTIN 300 MG PO CAPS
900.0000 mg | ORAL_CAPSULE | Freq: Every day | ORAL | Status: DC
  Administered 2018-11-17 – 2018-11-18 (×2): 900 mg via ORAL
  Filled 2018-11-17 (×2): qty 3

## 2018-11-17 MED ORDER — PANTOPRAZOLE SODIUM 40 MG PO TBEC
40.0000 mg | DELAYED_RELEASE_TABLET | Freq: Every day | ORAL | Status: DC
  Administered 2018-11-17 – 2018-11-19 (×3): 40 mg via ORAL
  Filled 2018-11-17 (×3): qty 1

## 2018-11-17 MED ORDER — CHLORHEXIDINE GLUCONATE CLOTH 2 % EX PADS: 6.0000 | MEDICATED_PAD | Freq: Once | CUTANEOUS | Status: DC

## 2018-11-17 MED ORDER — ONDANSETRON HCL 4 MG PO TABS: 4.0000 mg | ORAL_TABLET | Freq: Three times a day (TID) | ORAL | Status: DC | PRN

## 2018-11-17 MED ORDER — ONDANSETRON HCL 4 MG PO TABS: 4.0000 mg | ORAL_TABLET | Freq: Four times a day (QID) | ORAL | Status: DC | PRN

## 2018-11-17 MED ORDER — DEXAMETHASONE SODIUM PHOSPHATE 10 MG/ML IJ SOLN
INTRAMUSCULAR | Status: DC | PRN
  Administered 2018-11-17: 4 mg via INTRAVENOUS

## 2018-11-17 MED ORDER — PROPOFOL 10 MG/ML IV BOLUS
INTRAVENOUS | Status: AC
  Filled 2018-11-17: qty 20

## 2018-11-17 MED ORDER — DULAGLUTIDE 1.5 MG/0.5ML ~~LOC~~ SOAJ: 1.5000 mg | SUBCUTANEOUS | Status: DC

## 2018-11-17 MED ORDER — BISACODYL 10 MG RE SUPP: 10.0000 mg | Freq: Every day | RECTAL | Status: DC | PRN

## 2018-11-17 MED ORDER — PROPOFOL 10 MG/ML IV BOLUS
INTRAVENOUS | Status: DC | PRN
  Administered 2018-11-17: 120 mg via INTRAVENOUS

## 2018-11-17 MED ORDER — LACTATED RINGERS IV SOLN
INTRAVENOUS | Status: DC
  Administered 2018-11-17 (×3): via INTRAVENOUS

## 2018-11-17 MED ORDER — SODIUM CHLORIDE 0.9 % IV SOLN: 250.0000 mL | INTRAVENOUS | Status: DC

## 2018-11-17 MED ORDER — SODIUM CHLORIDE 0.9 % IV SOLN
INTRAVENOUS | Status: DC | PRN
  Administered 2018-11-17: 25 ug/min via INTRAVENOUS

## 2018-11-17 MED ORDER — THROMBIN 5000 UNITS EX SOLR
CUTANEOUS | Status: AC
  Filled 2018-11-17: qty 5000

## 2018-11-17 MED ORDER — CYCLOBENZAPRINE HCL 10 MG PO TABS
10.0000 mg | ORAL_TABLET | Freq: Three times a day (TID) | ORAL | Status: DC | PRN
  Administered 2018-11-17 – 2018-11-18 (×2): 10 mg via ORAL
  Filled 2018-11-17: qty 1

## 2018-11-17 MED ORDER — OXYCODONE HCL 5 MG PO TABS
ORAL_TABLET | ORAL | Status: AC
  Filled 2018-11-17: qty 2

## 2018-11-17 MED ORDER — PROMETHAZINE HCL 25 MG/ML IJ SOLN: 6.2500 mg | INTRAMUSCULAR | Status: DC | PRN

## 2018-11-17 MED ORDER — LIDOCAINE 2% (20 MG/ML) 5 ML SYRINGE
INTRAMUSCULAR | Status: DC | PRN
  Administered 2018-11-17: 40 mg via INTRAVENOUS

## 2018-11-17 MED ORDER — PHENOL 1.4 % MT LIQD: 1.0000 | OROMUCOSAL | Status: DC | PRN

## 2018-11-17 MED ORDER — ONDANSETRON HCL 4 MG/2ML IJ SOLN
INTRAMUSCULAR | Status: AC
  Filled 2018-11-17: qty 2

## 2018-11-17 MED ORDER — METOPROLOL SUCCINATE ER 25 MG PO TB24
25.0000 mg | ORAL_TABLET | Freq: Every morning | ORAL | Status: DC
  Administered 2018-11-18 – 2018-11-19 (×2): 25 mg via ORAL
  Filled 2018-11-17 (×2): qty 1

## 2018-11-17 MED ORDER — PIOGLITAZONE HCL 15 MG PO TABS
15.0000 mg | ORAL_TABLET | Freq: Every day | ORAL | Status: DC
  Administered 2018-11-17 – 2018-11-19 (×3): 15 mg via ORAL
  Filled 2018-11-17 (×3): qty 1

## 2018-11-17 MED ORDER — SUGAMMADEX SODIUM 200 MG/2ML IV SOLN
INTRAVENOUS | Status: DC | PRN
  Administered 2018-11-17: 300 mg via INTRAVENOUS

## 2018-11-17 MED ORDER — PHENYLEPHRINE HCL (PRESSORS) 10 MG/ML IV SOLN
INTRAVENOUS | Status: DC | PRN
  Administered 2018-11-17 (×2): 80 ug via INTRAVENOUS

## 2018-11-17 MED ORDER — BUPIVACAINE LIPOSOME 1.3 % IJ SUSP
20.0000 mL | Freq: Once | INTRAMUSCULAR | Status: DC
  Filled 2018-11-17: qty 20

## 2018-11-17 MED ORDER — DOCUSATE SODIUM 100 MG PO CAPS
100.0000 mg | ORAL_CAPSULE | Freq: Two times a day (BID) | ORAL | Status: DC
  Administered 2018-11-17 – 2018-11-19 (×4): 100 mg via ORAL
  Filled 2018-11-17 (×4): qty 1

## 2018-11-17 MED ORDER — BUPIVACAINE-EPINEPHRINE (PF) 0.5% -1:200000 IJ SOLN
INTRAMUSCULAR | Status: DC | PRN
  Administered 2018-11-17: 10 mL

## 2018-11-17 MED ORDER — SODIUM CHLORIDE 0.9% FLUSH: 3.0000 mL | INTRAVENOUS | Status: DC | PRN

## 2018-11-17 MED ORDER — BACITRACIN ZINC 500 UNIT/GM EX OINT
TOPICAL_OINTMENT | CUTANEOUS | Status: AC
  Filled 2018-11-17: qty 28.35

## 2018-11-17 MED ORDER — BUPIVACAINE-EPINEPHRINE (PF) 0.5% -1:200000 IJ SOLN
INTRAMUSCULAR | Status: AC
  Filled 2018-11-17: qty 30

## 2018-11-17 MED ORDER — INSULIN ASPART 100 UNIT/ML ~~LOC~~ SOLN
0.0000 [IU] | Freq: Three times a day (TID) | SUBCUTANEOUS | Status: DC
  Administered 2018-11-18: 3 [IU] via SUBCUTANEOUS
  Administered 2018-11-18 (×2): 8 [IU] via SUBCUTANEOUS
  Administered 2018-11-19 (×2): 5 [IU] via SUBCUTANEOUS

## 2018-11-17 MED ORDER — TRAMADOL HCL 50 MG PO TABS: 50.0000 mg | ORAL_TABLET | Freq: Two times a day (BID) | ORAL | Status: DC | PRN

## 2018-11-17 MED ORDER — ACETAMINOPHEN 325 MG PO TABS
650.0000 mg | ORAL_TABLET | ORAL | Status: DC | PRN
  Administered 2018-11-18: 650 mg via ORAL
  Filled 2018-11-17: qty 2

## 2018-11-17 MED ORDER — CEFAZOLIN SODIUM-DEXTROSE 2-4 GM/100ML-% IV SOLN
2.0000 g | INTRAVENOUS | Status: AC
  Administered 2018-11-17: 2 g via INTRAVENOUS

## 2018-11-17 MED ORDER — SODIUM CHLORIDE 0.9 % IV SOLN
INTRAVENOUS | Status: DC | PRN
  Administered 2018-11-17: 500 mL

## 2018-11-17 MED ORDER — LIDOCAINE 2% (20 MG/ML) 5 ML SYRINGE
INTRAMUSCULAR | Status: AC
  Filled 2018-11-17: qty 5

## 2018-11-17 MED ORDER — MENTHOL 3 MG MT LOZG: 1.0000 | LOZENGE | OROMUCOSAL | Status: DC | PRN

## 2018-11-17 MED ORDER — GABAPENTIN 300 MG PO CAPS
300.0000 mg | ORAL_CAPSULE | Freq: Every day | ORAL | Status: DC
  Administered 2018-11-18 – 2018-11-19 (×2): 300 mg via ORAL
  Filled 2018-11-17 (×2): qty 1

## 2018-11-17 MED ORDER — CYCLOBENZAPRINE HCL 10 MG PO TABS
ORAL_TABLET | ORAL | Status: AC
  Filled 2018-11-17: qty 1

## 2018-11-17 MED ORDER — OXYCODONE HCL 5 MG PO TABS: 5.0000 mg | ORAL_TABLET | ORAL | Status: DC | PRN

## 2018-11-17 MED ORDER — MIDAZOLAM HCL 5 MG/5ML IJ SOLN
INTRAMUSCULAR | Status: DC | PRN
  Administered 2018-11-17: 2 mg via INTRAVENOUS

## 2018-11-17 MED ORDER — THROMBIN 5000 UNITS EX SOLR
OROMUCOSAL | Status: DC | PRN
  Administered 2018-11-17: 5 mL via TOPICAL

## 2018-11-17 MED ORDER — MIDAZOLAM HCL 2 MG/2ML IJ SOLN: 0.5000 mg | Freq: Once | INTRAMUSCULAR | Status: DC | PRN

## 2018-11-17 MED ORDER — ONDANSETRON HCL 4 MG/2ML IJ SOLN: 4.0000 mg | Freq: Four times a day (QID) | INTRAMUSCULAR | Status: DC | PRN

## 2018-11-17 MED ORDER — 0.9 % SODIUM CHLORIDE (POUR BTL) OPTIME
TOPICAL | Status: DC | PRN
  Administered 2018-11-17: 1000 mL

## 2018-11-17 MED ORDER — PHENYLEPHRINE 40 MCG/ML (10ML) SYRINGE FOR IV PUSH (FOR BLOOD PRESSURE SUPPORT)
PREFILLED_SYRINGE | INTRAVENOUS | Status: AC
  Filled 2018-11-17: qty 10

## 2018-11-17 MED ORDER — ONDANSETRON HCL 4 MG/2ML IJ SOLN
INTRAMUSCULAR | Status: DC | PRN
  Administered 2018-11-17: 4 mg via INTRAVENOUS

## 2018-11-17 SURGICAL SUPPLY — 59 items
BAG DECANTER FOR FLEXI CONT (MISCELLANEOUS) ×2 IMPLANT
BASKET BONE COLLECTION (BASKET) ×2 IMPLANT
BENZOIN TINCTURE PRP APPL 2/3 (GAUZE/BANDAGES/DRESSINGS) ×2 IMPLANT
BUR MATCHSTICK NEURO 3.0 LAGG (BURR) ×2 IMPLANT
BUR PRECISION FLUTE 6.0 (BURR) ×4 IMPLANT
CAGE ALTERA 10X31X9-13 15D (Cage) ×2 IMPLANT
CANISTER SUCT 3000ML PPV (MISCELLANEOUS) ×2 IMPLANT
CAP REVERE LOCKING (Cap) ×16 IMPLANT
CARTRIDGE OIL MAESTRO DRILL (MISCELLANEOUS) ×1 IMPLANT
CONN CROSSLINK REV 6.35 48-60 (Connector) ×2 IMPLANT
CONNECTOR CRSLNK REV6.35 48-60 (Connector) ×1 IMPLANT
CONT SPEC 4OZ CLIKSEAL STRL BL (MISCELLANEOUS) ×2 IMPLANT
COVER BACK TABLE 60X90IN (DRAPES) ×2 IMPLANT
DIFFUSER DRILL AIR PNEUMATIC (MISCELLANEOUS) ×2 IMPLANT
DRAPE C-ARM 42X72 X-RAY (DRAPES) ×4 IMPLANT
DRAPE HALF SHEET 40X57 (DRAPES) ×2 IMPLANT
DRAPE LAPAROTOMY 100X72X124 (DRAPES) ×2 IMPLANT
DRAPE SURG 17X23 STRL (DRAPES) ×8 IMPLANT
DRSG OPSITE POSTOP 4X8 (GAUZE/BANDAGES/DRESSINGS) ×2 IMPLANT
ELECT BLADE 4.0 EZ CLEAN MEGAD (MISCELLANEOUS) ×2
ELECT REM PT RETURN 9FT ADLT (ELECTROSURGICAL) ×2
ELECTRODE BLDE 4.0 EZ CLN MEGD (MISCELLANEOUS) ×1 IMPLANT
ELECTRODE REM PT RTRN 9FT ADLT (ELECTROSURGICAL) ×1 IMPLANT
EVACUATOR 1/8 PVC DRAIN (DRAIN) IMPLANT
GAUZE 4X4 16PLY RFD (DISPOSABLE) ×2 IMPLANT
GAUZE SPONGE 4X4 12PLY STRL (GAUZE/BANDAGES/DRESSINGS) ×2 IMPLANT
GLOVE BIO SURGEON STRL SZ8 (GLOVE) ×4 IMPLANT
GLOVE BIO SURGEON STRL SZ8.5 (GLOVE) ×4 IMPLANT
GLOVE BIOGEL PI IND STRL 7.0 (GLOVE) ×3 IMPLANT
GLOVE BIOGEL PI IND STRL 7.5 (GLOVE) ×3 IMPLANT
GLOVE BIOGEL PI INDICATOR 7.0 (GLOVE) ×3
GLOVE BIOGEL PI INDICATOR 7.5 (GLOVE) ×3
GOWN STRL REUS W/ TWL LRG LVL3 (GOWN DISPOSABLE) ×2 IMPLANT
GOWN STRL REUS W/ TWL XL LVL3 (GOWN DISPOSABLE) ×2 IMPLANT
GOWN STRL REUS W/TWL 2XL LVL3 (GOWN DISPOSABLE) IMPLANT
GOWN STRL REUS W/TWL LRG LVL3 (GOWN DISPOSABLE) ×2
GOWN STRL REUS W/TWL XL LVL3 (GOWN DISPOSABLE) ×2
HEMOSTAT POWDER KIT SURGIFOAM (HEMOSTASIS) ×2 IMPLANT
KIT BASIN OR (CUSTOM PROCEDURE TRAY) ×2 IMPLANT
KIT TURNOVER KIT B (KITS) ×2 IMPLANT
NEEDLE HYPO 21X1.5 SAFETY (NEEDLE) ×2 IMPLANT
NEEDLE HYPO 22GX1.5 SAFETY (NEEDLE) ×2 IMPLANT
NS IRRIG 1000ML POUR BTL (IV SOLUTION) ×2 IMPLANT
OIL CARTRIDGE MAESTRO DRILL (MISCELLANEOUS) ×2
PACK LAMINECTOMY NEURO (CUSTOM PROCEDURE TRAY) ×2 IMPLANT
PAD ARMBOARD 7.5X6 YLW CONV (MISCELLANEOUS) ×6 IMPLANT
PUTTY DBM 10CC CALC GRAN (Putty) ×2 IMPLANT
ROD CURVED REVERE 6.35 95MM (Rod) ×4 IMPLANT
SCREW 7.5X50MM (Screw) ×4 IMPLANT
STRIP CLOSURE SKIN 1/2X4 (GAUZE/BANDAGES/DRESSINGS) ×2 IMPLANT
SUT VIC AB 1 CT1 18XBRD ANBCTR (SUTURE) ×2 IMPLANT
SUT VIC AB 1 CT1 8-18 (SUTURE) ×2
SUT VIC AB 2-0 CP2 18 (SUTURE) ×4 IMPLANT
SYR 20CC LL (SYRINGE) ×2 IMPLANT
TOWEL GREEN STERILE (TOWEL DISPOSABLE) ×2 IMPLANT
TOWEL GREEN STERILE FF (TOWEL DISPOSABLE) ×2 IMPLANT
TOWEL OR NON WOVEN STRL DISP B (DISPOSABLE) ×2 IMPLANT
TRAY FOLEY MTR SLVR 16FR STAT (SET/KITS/TRAYS/PACK) ×2 IMPLANT
WATER STERILE IRR 1000ML POUR (IV SOLUTION) ×2 IMPLANT

## 2018-11-17 NOTE — Progress Notes (Signed)
Subjective: The patient is somnolent but arousable.  Her back is appropriately sore.  She looks well.  Objective: Vital signs in last 24 hours: Temp:  [98.1 F (36.7 C)-98.3 F (36.8 C)] 98.3 F (36.8 C) (06/29 1453) Pulse Rate:  [84-89] 89 (06/29 1511) Resp:  [12-20] 12 (06/29 1511) BP: (120-142)/(66-75) 120/67 (06/29 1505) SpO2:  [96 %-97 %] 97 % (06/29 1511) Weight:  [115.7 kg] 115.7 kg (06/29 0819) Estimated body mass index is 45.17 kg/m as calculated from the following:   Height as of this encounter: 5\' 3"  (1.6 m).   Weight as of this encounter: 115.7 kg.   Intake/Output from previous day: No intake/output data recorded. Intake/Output this shift: Total I/O In: 2100 [I.V.:2100] Out: 400 [Urine:150; Blood:250]  Physical exam the patient is somnolent but arousable.  She is moving her lower extremities well.  Lab Results: No results for input(s): WBC, HGB, HCT, PLT in the last 72 hours. BMET No results for input(s): NA, K, CL, CO2, GLUCOSE, BUN, CREATININE, CALCIUM in the last 72 hours.  Studies/Results: Dg Lumbar Spine 2-3 Views  Result Date: 11/17/2018 CLINICAL DATA:  Lumbar spine surgery. EXAM: DG C-ARM 61-120 MIN; LUMBAR SPINE - 2-3 VIEW COMPARISON:  06/18/2016. FINDINGS: Lumbar spine difficult to number. Postsurgical changes noted of the lumbar spine hardware appears to be intact. Anatomic alignment. 0 minutes 13 seconds fluoroscopy time utilized. IMPRESSION: Postsurgical changes lumbar spine. Electronically Signed   By: Marcello Moores  Register   On: 11/17/2018 13:57   Dg C-arm 1-60 Min  Result Date: 11/17/2018 CLINICAL DATA:  Lumbar spine surgery. EXAM: DG C-ARM 61-120 MIN; LUMBAR SPINE - 2-3 VIEW COMPARISON:  06/18/2016. FINDINGS: Lumbar spine difficult to number. Postsurgical changes noted of the lumbar spine hardware appears to be intact. Anatomic alignment. 0 minutes 13 seconds fluoroscopy time utilized. IMPRESSION: Postsurgical changes lumbar spine. Electronically Signed    By: Marcello Moores  Register   On: 11/17/2018 13:57    Assessment/Plan: The patient is doing well.  I spoke with her husband.  LOS: 0 days     [name] 11/17/2018, 3:14 PM

## 2018-11-17 NOTE — Anesthesia Procedure Notes (Signed)
Procedure Name: Intubation Date/Time: 11/17/2018 10:56 AM Performed by: Inda Coke, CRNA Pre-anesthesia Checklist: Patient identified, Emergency Drugs available, Suction available and Patient being monitored Patient Re-evaluated:Patient Re-evaluated prior to induction Oxygen Delivery Method: Circle System Utilized Preoxygenation: Pre-oxygenation with 100% oxygen Induction Type: IV induction Ventilation: Mask ventilation without difficulty and Oral airway inserted - appropriate to patient size Laryngoscope Size: Glidescope and 3 Grade View: Grade I Tube type: Oral Tube size: 7.5 mm Number of attempts: 1 Airway Equipment and Method: Stylet and Oral airway Placement Confirmation: ETT inserted through vocal cords under direct vision,  positive ETCO2 and breath sounds checked- equal and bilateral Secured at: 22 cm Tube secured with: Tape Dental Injury: Teeth and Oropharynx as per pre-operative assessment  Difficulty Due To: Difficulty was anticipated, Difficult Airway- due to large tongue, Difficult Airway- due to reduced neck mobility and Difficult Airway- due to limited oral opening

## 2018-11-17 NOTE — Transfer of Care (Signed)
Immediate Anesthesia Transfer of Care Note  Patient: [name]  Procedure(s) Performed: EXPLORATION OF LUMBAR FUSION, LUMBAR TWO- LUMBAR THREE DECOMPRESSIVE LAMINECTOMY, INSTRUMENTATION W/PEDICLE SCREWS AND ROD,PLACE INTERBODY PROSTHESIS, POSTERIOR LATERAL ARTHRODESIS (N/A Spine Lumbar)  Patient Location: PACU  Anesthesia Type:General  Level of Consciousness: awake, alert  and oriented  Airway & Oxygen Therapy: Patient Spontanous Breathing and Patient connected to nasal cannula oxygen  Post-op Assessment: Report given to RN and Post -op Vital signs reviewed and stable  Post vital signs: Reviewed and stable  Last Vitals:  Vitals Value Taken Time  BP 142/75 11/17/18 1449  Temp 36.8 C 11/17/18 1453  Pulse 91 11/17/18 1456  Resp 15 11/17/18 1456  SpO2 93 % 11/17/18 1456  Vitals shown include unvalidated device data.  Last Pain:  Vitals:   11/17/18 0832  TempSrc:   PainSc: 5       Patients Stated Pain Goal: 3 (84/03/75 4360)  Complications: No apparent anesthesia complications

## 2018-11-17 NOTE — Anesthesia Postprocedure Evaluation (Signed)
Anesthesia Post Note  Patient: NASIYAH LAVERDIERE  Procedure(s) Performed: EXPLORATION OF LUMBAR FUSION, LUMBAR TWO- LUMBAR THREE DECOMPRESSIVE LAMINECTOMY, INSTRUMENTATION W/PEDICLE SCREWS AND ROD,PLACE INTERBODY PROSTHESIS, POSTERIOR LATERAL ARTHRODESIS (N/A Spine Lumbar)     Patient location during evaluation: PACU Anesthesia Type: General Level of consciousness: sedated, patient cooperative and oriented Pain management: pain level controlled Vital Signs Assessment: post-procedure vital signs reviewed and stable Respiratory status: spontaneous breathing, nonlabored ventilation, respiratory function stable and patient connected to nasal cannula oxygen Cardiovascular status: blood pressure returned to baseline and stable Postop Assessment: no apparent nausea or vomiting Anesthetic complications: no    Last Vitals:  Vitals:   11/17/18 1521 11/17/18 1530  BP: 130/78   Pulse: 91 96  Resp: 14 14  Temp:    SpO2: 98% 98%    Last Pain:  Vitals:   11/17/18 1521  TempSrc:   PainSc: 6                  Aniko Finnigan,E. [employer]

## 2018-11-17 NOTE — Op Note (Signed)
Brief history: The patient is a 58 year old white female on whom I previously performed an L3-4 and L4-5 instrumentation and fusion.  She did well initially but has developed recurrent back and leg pain consistent with neurogenic claudication.  She failed medical management and was worked up with a lumbar MRI and lumbar x-rays which demonstrated L2-3 degenerative disc disease and spinal stenosis.  I discussed the various treatment options with her including surgery.  She has weighed the risks, benefits and alternatives surgery and decided to proceed with a lumbar decompression, instrumentation and fusion.  Preoperative diagnosis: L2-3 degenerative disc disease, spinal stenosis compressing both the L2 and the L3 nerve roots; lumbago; lumbar radiculopathy; neurogenic claudication  Postoperative diagnosis: The same  Procedure: Bilateral L2-3 laminotomy/foraminotomies/medial facetectomy to decompress the bilateral L2 and L3 nerve roots(the work required to do this was in addition to the work required to do the posterior lumbar interbody fusion because of the patient's spinal stenosis, facet arthropathy. Etc. requiring a wide decompression of the nerve roots.);  L2-3 transforaminal lumbar interbody fusion with local morselized autograft bone and Zimmer DBM; insertion of interbody prosthesis at L2-3 (globus peek expandable interbody prosthesis); posterior segmental instrumentation from L2 to L5 with globus titanium pedicle screws and rods; posterior lateral arthrodesis at L2-3 bilaterally with local morselized autograft bone and Zimmer DBM; exploration of lumbar fusion/removal of lumbar hardware  Surgeon: Dr. Earle Gell  Asst.: Arnetha Massy, nurse practitioner  Anesthesia: Gen. endotracheal  Estimated blood loss: 200 cc  Drains: None  Complications: None  Description of procedure: The patient was brought to the operating room by the anesthesia team. General endotracheal anesthesia was induced. The  patient was turned to the prone position on the Wilson frame. The patient's lumbosacral region was then prepared with Betadine scrub and Betadine solution. Sterile drapes were applied.  I then injected the area to be incised with Marcaine with epinephrine solution. I then used the scalpel to make a linear midline incision over the L2-3, L3-4 and L4-5 interspace, incising through the old surgical scar. I then used electrocautery to perform a bilateral subperiosteal dissection exposing the spinous process and lamina of L2, L3, L4 and L5 and to expose the old hardware at L3-4 and L4-5.  We then inserted the Verstrac retractor to provide exposure.  We explored the fusion by removing the caps from the old screws, removing the cross connector, and then removing the rods.  I inspected the arthrodesis at L3-4 and L4-5.  It appeared solid.  I began the decompression by using the high speed drill to perform laminotomies at L2-3 bilaterally. We then used the Kerrison punches to widen the laminotomy and removed the ligamentum flavum at L2-3 bilaterally. We used the Kerrison punches to remove the medial facets at L2-3 bilaterally. We performed wide foraminotomies about the bilateral L2 and L3 nerve roots completing the decompression.  We now turned our attention to the posterior lumbar interbody fusion. I used a scalpel to incise the intervertebral disc at L2-3 bilaterally. I then performed a partial intervertebral discectomy at L2-3 bilaterally using the pituitary forceps. We prepared the vertebral endplates at U3-1 bilaterally for the fusion by removing the soft tissues with the curettes. We then used the trial spacers to pick the appropriate sized interbody prosthesis. We prefilled his prosthesis with a combination of local morselized autograft bone that we obtained during the decompression as well as Zimmer DBM. We inserted the prefilled prosthesis into the interspace at L2-3, we then turned and expanded the  prosthesis. There was a good snug fit of the prosthesis in the interspace. We then filled and the remainder of the intervertebral disc space with local morselized autograft bone and Zimmer DBM. This completed the posterior lumbar interbody arthrodesis.  We now turned attention to the instrumentation. Under fluoroscopic guidance we cannulated the bilateral L2 pedicles with the bone probe. We then removed the bone probe. We then tapped the pedicle with a 6.5 millimeter tap. We then removed the tap. We probed inside the tapped pedicle with a ball probe to rule out cortical breaches. We then inserted a 7.5 x 50 millimeter pedicle screw into the L2 pedicles bilaterally under fluoroscopic guidance. We then palpated along the medial aspect of the pedicles to rule out cortical breaches. There were none. The nerve roots were not injured. We then connected the unilateral pedicle screws with a lordotic rod. We compressed the construct and secured the rod in place with the caps. We then tightened the caps appropriately. This completed the instrumentation from L2-L5 bilaterally.  We now turned our attention to the posterior lateral arthrodesis at L2-3 bilaterally. We used the high-speed drill to decorticate the remainder of the facets, pars, transverse process at L2-3 bilaterally. We then applied a combination of local morselized autograft bone and Zimmer DBM over these decorticated posterior lateral structures. This completed the posterior lateral arthrodesis.  We then obtained hemostasis using bipolar electrocautery. We irrigated the wound out with bacitracin solution. We inspected the thecal sac and nerve roots and noted they were well decompressed. We then removed the retractor. We placed vancomycin powder in the wound.  We injected Exparel . We reapproximated patient's thoracolumbar fascia with interrupted #1 Vicryl suture. We reapproximated patient's subcutaneous tissue with interrupted 2-0 Vicryl suture. The  reapproximated patient's skin with Steri-Strips and benzoin. The wound was then coated with bacitracin ointment. A sterile dressing was applied. The drapes were removed. The patient was subsequently returned to the supine position where they were extubated by the anesthesia team. He was then transported to the post anesthesia care unit in stable condition. All sponge instrument and needle counts were reportedly correct at the end of this case.

## 2018-11-17 NOTE — Progress Notes (Signed)
Orthopedic Tech Progress Note Patient Details:  [name] 05/24/1960 [id number] Pt supplied back brace. Patient ID: [name], female   DOB: 05/29/1960, 58 y.o.   MRN: [id number]   [name] 11/17/2018, 4:36 PM

## 2018-11-17 NOTE — H&P (Signed)
Subjective: The patient is a 58 year old obese white female on whom I previously performed a lumbar decompression and fusion.  The patient has done well until recently when she developed recurrent back and leg pain.  She failed medical management.  She was worked up with a lumbar MRI and lumbar x-rays which demonstrated L2-3 degeneration and stenosis.  I discussed the various treatment options with her.  She has decided to proceed with surgery.  Past Medical History:  Diagnosis Date  . Arthritis   . Asthma without status asthmaticus 08/02/2015   Overview:  mild, not requiring treatment   . Cervical spinal stenosis   . Degeneration of intervertebral disc of cervical region 04/28/2015  . Depression   . Diabetes ([place])   . GERD (gastroesophageal reflux disease)   . Headache, migraine 08/02/2015  . Heartburn   . History of kidney stones   . HLD (hyperlipidemia)   . Kidney stones 07/18/2015  . Lumbar stenosis   . Pancreatitis   . Radiculopathy of cervical region   . Sleep apnea    does not wear cpap     Past Surgical History:  Procedure Laterality Date  . ANTERIOR CERVICAL DECOMP/DISCECTOMY FUSION N/A 04/20/2016   Procedure: CERVICAL THREE-FOUR  ANTERIOR CERVICAL DECOMPRESSION/DISCECTOMY/FUSION WITH PARTIAL REMOVAL OF PLATE AT CERVICAL FOUR;  Surgeon: Leeroy Cha, MD;  Location: [facility];  Service: Neurosurgery;  Laterality: N/A;  . [place]  . CERVICAL BIOPSY  W/ LOOP ELECTRODE EXCISION  08/12/2013  . CERVICAL FUSION  2009  . COLONOSCOPY    . COLPOSCOPY  03/26/2013  . CYSTOSCOPY WITH STENT PLACEMENT Right 08/24/2015   Procedure: CYSTOSCOPY WITH STENT PLACEMENT;  Surgeon: Cleon Gustin, MD;  Location: [facility];  Service: Urology;  Laterality: Right;  . NEPHROLITHOTOMY Right 08/24/2015   Procedure: NEPHROLITHOTOMY PERCUTANEOUS WITH SURGEON TO GET ACCESS;  Surgeon: Cleon Gustin, MD;  Location: [facility];  Service: Urology;  Laterality: Right;  . TUBAL LIGATION       Allergies  Allergen Reactions  . Biaxin [Clarithromycin] Nausea And Vomiting and Nausea Only    Social History   Tobacco Use  . Smoking status: Never Smoker  . Smokeless tobacco: Never Used  Substance Use Topics  . Alcohol use: Yes    Alcohol/week: 0.0 standard drinks    Comment: occasional    Family History  Problem Relation Age of Onset  . Heart failure Mother   . Heart attack Father   . Colon cancer Maternal Grandfather 34  . Kidney disease Neg Hx   . Bladder Cancer Neg Hx    Prior to Admission medications   Medication Sig Start Date End Date Taking? Authorizing Provider  atorvastatin (LIPITOR) 40 MG tablet Take 40 mg by mouth daily at 6 PM.   Yes [provider]  Dulaglutide (TRULICITY) 1.5 UX/3.2TF SOPN Inject 1.5 mg into the skin once a week. Mondays   Yes [provider]  DULoxetine (CYMBALTA) 60 MG capsule Take 60 mg by mouth at bedtime.  05/31/15  Yes [provider]  gabapentin (NEURONTIN) 300 MG capsule Take 300-900 mg by mouth See admin instructions. 300 mg in the morning and 900 mg at night 06/01/15  Yes [provider]  insulin regular human CONCENTRATED (HUMULIN R U-500 KWIKPEN) 500 UNIT/ML kwikpen Inject 35-45 Units into the skin See admin instructions. 45 units in the morning, 40 units at lunch, and 35 units at dinner   Yes [provider]  losartan (COZAAR) 25 MG  tablet Take 25 mg by mouth every morning.  05/31/15  Yes [provider]  metoprolol succinate (TOPROL-XL) 25 MG 24 hr tablet Take 25 mg by mouth every morning.  05/31/15  Yes [provider]  nabumetone (RELAFEN) 750 MG tablet Take 1 tablet by mouth 2 (two) times a day. 07/28/18  Yes [provider]  omeprazole (PRILOSEC) 40 MG capsule Take 40 mg by mouth daily. Pt may take additional dose if needed 06/01/15  Yes [provider]  ondansetron (ZOFRAN) 4 MG tablet Take 1 tablet (4 mg total) by mouth every 8 (eight) hours as needed  for nausea or vomiting. 08/25/15  Yes McKenzie, Candee Furbish, MD  pioglitazone (ACTOS) 15 MG tablet Take 15 mg by mouth daily.   Yes [provider]  rizatriptan (MAXALT) 10 MG tablet Take 10 mg by mouth as needed for migraine. May repeat in 2 hours if needed   Yes [provider]  traMADol (ULTRAM) 50 MG tablet Take 50-100 mg by mouth 2 (two) times daily as needed for moderate pain (depends on pain level if 50-100 mg).  06/23/15  Yes [provider]  UNIFINE PENTIPS 31G X 5 MM [place]  05/26/15   [provider]  zolpidem (AMBIEN) 5 MG tablet Take 5 mg by mouth at bedtime as needed for sleep. Reported on 08/30/2015    [provider]     Review of Systems  Positive ROS: As above  All other systems have been reviewed and were otherwise negative with the exception of those mentioned in the HPI and as above.  Objective: Vital signs in last 24 hours: Temp:  [98.1 F (36.7 C)] 98.1 F (36.7 C) (06/29 0819) Pulse Rate:  [84] 84 (06/29 0819) Resp:  [20] 20 (06/29 0819) BP: (122)/(66) 122/66 (06/29 0819) SpO2:  [97 %] 97 % (06/29 0819) Weight:  [115.7 kg] 115.7 kg (06/29 0819) Estimated body mass index is 45.17 kg/m as calculated from the following:   Height as of this encounter: 5\' 3"  (1.6 m).   Weight as of this encounter: 115.7 kg.   General Appearance: Alert Head: Normocephalic, without obvious abnormality, atraumatic Eyes: PERRL, conjunctiva/corneas clear, EOM's intact,    Ears: Normal  Throat: Normal  Neck: Supple, Back: Her lumbar incision is well-healed.  Lungs: Clear to auscultation bilaterally, respirations unlabored Heart: Regular rate and rhythm, no murmur, rub or gallop Abdomen: Soft, non-tender Extremities: Extremities normal, atraumatic, no cyanosis or edema Skin: unremarkable  NEUROLOGIC:   Mental status: alert and oriented,Motor Exam - grossly normal Sensory Exam - grossly normal Reflexes:  Coordination - grossly normal Gait -  grossly normal Balance - grossly normal Cranial Nerves: I: smell Not tested  II: visual acuity  OS: Normal  OD: Normal   II: visual fields Full to confrontation  II: pupils Equal, round, reactive to light  III,VII: ptosis None  III,IV,VI: extraocular muscles  Full ROM  V: mastication Normal  V: facial light touch sensation  Normal  V,VII: corneal reflex  Present  VII: facial muscle function - upper  Normal  VII: facial muscle function - lower Normal  VIII: hearing Not tested  IX: soft palate elevation  Normal  IX,X: gag reflex Present  XI: trapezius strength  5/5  XI: sternocleidomastoid strength 5/5  XI: neck flexion strength  5/5  XII: tongue strength  Normal    Data Review Lab Results  Component Value Date   WBC 8.2 11/13/2018   HGB 12.9 11/13/2018   HCT  40.9 11/13/2018   MCV 98.3 11/13/2018   PLT 314 11/13/2018   Lab Results  Component Value Date   NA 139 11/13/2018   K 4.5 11/13/2018   CL 105 11/13/2018   CO2 27 11/13/2018   BUN 16 11/13/2018   CREATININE 0.86 11/13/2018   GLUCOSE 204 (H) 11/13/2018   No results found for: INR, PROTIME  Assessment/Plan: L2-3 adjacent disease, lumbar spinal stenosis, lumbago, lumbar radiculopathy, neurogenic claudication: I have discussed the situation with the patient.  I reviewed her imaging studies with her and pointed out the abnormalities.  We have discussed the various treatment options including surgery.  I have described the surgical treatment option of an L2-3 decompression, instrumentation and fusion.  I have shown her surgical models.  I have given her a surgical pamphlet.  We have discussed the risks, benefits, alternatives, expected postoperative course, and likelihood of achieving our goals with surgery.  I have answered all her questions.  She has decided proceed with surgery.   Ophelia Charter 11/17/2018 10:37 AM

## 2018-11-18 LAB — CBC
HCT: 36.8 % (ref 36.0–46.0)
Hemoglobin: 11.6 g/dL — ABNORMAL LOW (ref 12.0–15.0)
MCH: 31.4 pg (ref 26.0–34.0)
MCHC: 31.5 g/dL (ref 30.0–36.0)
MCV: 99.5 fL (ref 80.0–100.0)
Platelets: 316 10*3/uL (ref 150–400)
RBC: 3.7 MIL/uL — ABNORMAL LOW (ref 3.87–5.11)
RDW: 13.1 % (ref 11.5–15.5)
WBC: 13.6 10*3/uL — ABNORMAL HIGH (ref 4.0–10.5)
nRBC: 0 % (ref 0.0–0.2)

## 2018-11-18 LAB — BASIC METABOLIC PANEL
Anion gap: 8 (ref 5–15)
BUN: 17 mg/dL (ref 6–20)
CO2: 27 mmol/L (ref 22–32)
Calcium: 8.7 mg/dL — ABNORMAL LOW (ref 8.9–10.3)
Chloride: 100 mmol/L (ref 98–111)
Creatinine, Ser: 0.94 mg/dL (ref 0.44–1.00)
GFR calc Af Amer: >60 mL/min (ref >60)
GFR calc non Af Amer: >60 mL/min (ref >60)
Glucose, Bld: 223 mg/dL — ABNORMAL HIGH (ref 70–99)
Potassium: 4 mmol/L (ref 3.5–5.1)
Sodium: 135 mmol/L (ref 135–145)

## 2018-11-18 LAB — GLUCOSE, CAPILLARY
Glucose-Capillary: 197 mg/dL — ABNORMAL HIGH (ref 70–99)
Glucose-Capillary: 273 mg/dL — ABNORMAL HIGH (ref 70–99)
Glucose-Capillary: 280 mg/dL — ABNORMAL HIGH (ref 70–99)
Glucose-Capillary: 308 mg/dL — ABNORMAL HIGH (ref 70–99)

## 2018-11-18 NOTE — Evaluation (Addendum)
Occupational Therapy Evaluation Patient Details Name: [name] MRN: [id number] DOB: 1960-12-14 Today's Date: 11/18/2018    History of Present Illness Pt is a 58 y/o s/p L2-3 decompression and fusion. PMH: arthritis, depression, ACDF, cervical fusion, prior lumbar fusion, DM, pancreatitis, sleep apnea.    Clinical Impression   PTA patient independent and working. Admitted for above and limited by problem list below, including back precautions, pain, activity tolerance, body habitus limiting independence with ADLs.  Patient currently requires min guard to supervision for transfers and mobility in room using RW, mod assist for LB ADLs and supervision for UB ADLs.  She reports her spouse will be able to provide 24/7 support (hes retired), and assist with ADLs as needed. She will benefit from continued OT services while admitted to maximize independence and safety with ADLs, mobility but anticipate no further needs after dc.     Follow Up Recommendations  No OT follow up;Supervision - Intermittent    Equipment Recommendations  Tub/shower seat (she may decide to purchase on her own)   Recommendations for Other Services PT consult     Precautions / Restrictions Precautions Precautions: Back Precaution Booklet Issued: Yes (comment) Precaution Comments: reviewed with pt  Required Braces or Orthoses: Spinal Brace Spinal Brace: Lumbar corset;Other (comment) Spinal Brace Comments: attempted to apply in sitting, donned in standing due to size Restrictions Weight Bearing Restrictions: No      Mobility Bed Mobility               General bed mobility comments: OOB in chair upon entry  Transfers Overall transfer level: Needs assistance Equipment used: Rolling walker (2 wheeled) Transfers: Sit to/from Stand Sit to Stand: Min guard;Supervision         General transfer comment: min guard initally fading to supervision for safety, cueing for hand placement and technique     Balance Overall balance assessment: Needs assistance Sitting-balance support: No upper extremity supported;Feet supported Sitting balance-Leahy Scale: Fair     Standing balance support: During functional activity;No upper extremity supported;Bilateral upper extremity supported Standing balance-Leahy Scale: Fair Standing balance comment: preference to UE support dynamically                           ADL either performed or assessed with clinical judgement   ADL Overall ADL's : Needs assistance/impaired     Grooming: Wash/dry hands;Supervision/safety;Standing   Upper Body Bathing: Set up;Sitting   Lower Body Bathing: Moderate assistance;Sit to/from stand Lower Body Bathing Details (indicate cue type and reason): pt unable to complete figure 4, reviewed use of long sponge and reports spouse can assist Upper Body Dressing : Sitting;Supervision/safety;Set up Upper Body Dressing Details (indicate cue type and reason): assist to don brace Lower Body Dressing: Moderate assistance;Sit to/from stand Lower Body Dressing Details (indicate cue type and reason): unable to complete figure 4 technique, educated on compensatory techniques and clothing- reports spouse can assist but would benefit from AE education  Toilet Transfer: Supervision/safety;Ambulation;Regular Toilet;RW Toilet Transfer Details (indicate cue type and reason): supervision for safety and technique  Toileting- Clothing Manipulation and Hygiene: Supervision/safety;Sit to/from stand       Functional mobility during ADLs: Min guard;Supervision/safety;Rolling walker(min guard fading to supervision ) General ADL Comments: pt educated on compensatory techniques for ADls, back precautions and brace mgmt      Vision         Perception     Praxis      Pertinent Vitals/Pain Pain  Assessment: 0-10 Pain Score: 5  Pain Location: back-incisional Pain Descriptors / Indicators: Discomfort;Operative site guarding Pain  Intervention(s): Limited activity within patient's tolerance;Monitored during session     Hand Dominance Right   Extremity/Trunk Assessment Upper Extremity Assessment Upper Extremity Assessment: RUE deficits/detail RUE Deficits / Details: hx of R UE weakness from prior cervical injury; limited FF to 90 degrees; grossly 3+/5 MMT RUE Sensation: WNL   Lower Extremity Assessment Lower Extremity Assessment: Defer to PT evaluation   Cervical / Trunk Assessment Cervical / Trunk Assessment: Other exceptions Cervical / Trunk Exceptions: s/p lumbar sx   Communication Communication Communication: No difficulties   Cognition Arousal/Alertness: Awake/alert Behavior During Therapy: WFL for tasks assessed/performed Overall Cognitive Status: Within Functional Limits for tasks assessed                                     General Comments       Exercises     Shoulder Instructions      Home Living Family/patient expects to be discharged to:: Private residence Living Arrangements: Spouse/significant other Available Help at Discharge: Family;Available 24 hours/day Type of Home: House Home Access: Stairs to enter [employer] of Steps: 3 Entrance Stairs-Rails: Right;Left Home Layout: One level     Bathroom Shower/Tub: Teacher, early years/pre: Standard     Home Equipment: Environmental consultant - 2 wheels;Bedside commode          Prior Functioning/Environment Level of Independence: Independent        Comments: driving and working        OT Problem List: Decreased strength;Decreased activity tolerance;Impaired balance (sitting and/or standing);Decreased safety awareness;Decreased knowledge of use of DME or AE;Decreased knowledge of precautions;Pain      OT Treatment/Interventions: Self-care/ADL training;DME and/or AE instruction;Balance training;Patient/family education;Therapeutic activities    OT Goals(Current goals can be found in the care plan  section) Acute Rehab OT Goals Patient Stated Goal: to get home  OT Goal Formulation: With patient Time For Goal Achievement: 12/02/18 Potential to Achieve Goals: Good  OT Frequency: Min 2X/week   Barriers to D/C:            Co-evaluation              AM-PAC OT "6 Clicks" Daily Activity     Outcome Measure Help from another person eating meals?: None Help from another person taking care of personal grooming?: A Little Help from another person toileting, which includes using toliet, bedpan, or urinal?: A Little Help from another person bathing (including washing, rinsing, drying)?: A Lot Help from another person to put on and taking off regular upper body clothing?: A Little Help from another person to put on and taking off regular lower body clothing?: A Lot 6 Click Score: 17   End of Session Equipment Utilized During Treatment: Rolling walker;Back brace Nurse Communication: Mobility status;Other (comment)(brace fit)  Activity Tolerance: Patient tolerated treatment well Patient left: in chair;with call bell/phone within reach  OT Visit Diagnosis: Other abnormalities of gait and mobility (R26.89);Pain Pain - part of body: (back-incisional)                Time: 4193-7902 OT Time Calculation (min): 21 min Charges:  OT General Charges $OT Visit: 1 Visit OT Evaluation $OT Eval Low Complexity: 1 Low  Delight Stare, OT Acute Rehabilitation Services Pager [phone] Office [phone]   Delight Stare 11/18/2018, 9:05 AM

## 2018-11-18 NOTE — Progress Notes (Signed)
Subjective: The patient is alert and pleasant.  She looks well.  Her back is appropriately sore.  She wants to go home tomorrow.  Objective: Vital signs in last 24 hours: Temp:  [97.2 F (36.2 C)-98.9 F (37.2 C)] 98.6 F (37 C) (06/30 0721) Pulse Rate:  [84-99] 95 (06/30 0721) Resp:  [12-20] 18 (06/30 0721) BP: (114-144)/(65-79) 126/65 (06/30 0721) SpO2:  [93 %-98 %] 93 % (06/30 0721) Weight:  [115.7 kg] 115.7 kg (06/29 0819) Estimated body mass index is 45.17 kg/m as calculated from the following:   Height as of this encounter: 5\' 3"  (1.6 m).   Weight as of this encounter: 115.7 kg.   Intake/Output from previous day: 06/29 0701 - 06/30 0700 In: 2402.8 [P.O.:80; I.V.:2223; IV Piggyback:99.8] Out: 400 [Urine:150; Blood:250] Intake/Output this shift: No intake/output data recorded.  Physical exam patient is alert and oriented.  Her lower extremity strength is normal.  Lab Results: No results for input(s): WBC, HGB, HCT, PLT in the last 72 hours. BMET No results for input(s): NA, K, CL, CO2, GLUCOSE, BUN, CREATININE, CALCIUM in the last 72 hours.  Studies/Results: Dg Lumbar Spine 2-3 Views  Result Date: 11/17/2018 CLINICAL DATA:  Lumbar spine surgery. EXAM: DG C-ARM 61-120 MIN; LUMBAR SPINE - 2-3 VIEW COMPARISON:  06/18/2016. FINDINGS: Lumbar spine difficult to number. Postsurgical changes noted of the lumbar spine hardware appears to be intact. Anatomic alignment. 0 minutes 13 seconds fluoroscopy time utilized. IMPRESSION: Postsurgical changes lumbar spine. Electronically Signed   By: Marcello Moores  Register   On: 11/17/2018 13:57   Dg C-arm 1-60 Min  Result Date: 11/17/2018 CLINICAL DATA:  Lumbar spine surgery. EXAM: DG C-ARM 61-120 MIN; LUMBAR SPINE - 2-3 VIEW COMPARISON:  06/18/2016. FINDINGS: Lumbar spine difficult to number. Postsurgical changes noted of the lumbar spine hardware appears to be intact. Anatomic alignment. 0 minutes 13 seconds fluoroscopy time utilized.  IMPRESSION: Postsurgical changes lumbar spine. Electronically Signed   By: Marcello Moores  Register   On: 11/17/2018 13:57    Assessment/Plan: Postop day #1: The patient is doing well.  We will mobilize her with PT and OT.  She will likely go home tomorrow.  I gave her discharge instructions and answered all her questions.  LOS: 1 day     Ophelia Charter 11/18/2018, 7:33 AM

## 2018-11-18 NOTE — Evaluation (Signed)
Physical Therapy Evaluation Patient Details Name: [name] MRN: [id number] DOB: March 14, 1961 Today's Date: 11/18/2018   History of Present Illness  Pt is a 58 y/o s/p L2-3 decompression and fusion. PMH: arthritis, depression, ACDF, cervical fusion, prior lumbar fusion, DM, pancreatitis, sleep apnea.   Clinical Impression  Patient is s/p above surgery resulting in the deficits listed below (see PT Problem List). Pt sleepy, reporting she is just very tired because she hasn't slept all night.  Patient will benefit from skilled PT to increase their independence and safety with mobility (while adhering to their precautions) to allow discharge to the venue listed below.     Follow Up Recommendations No PT follow up;Supervision - Intermittent    Equipment Recommendations  None recommended by PT    Recommendations for Other Services       Precautions / Restrictions Precautions Precautions: Back Precaution Booklet Issued: Yes (comment) Precaution Comments: reviewed with pt  Required Braces or Orthoses: Spinal Brace Spinal Brace: Lumbar corset;Other (comment) Spinal Brace Comments: attempted to apply in sitting, donned in standing due to size Restrictions Weight Bearing Restrictions: No      Mobility  Bed Mobility Overal bed mobility: Modified Independent             General bed mobility comments: with increased time and use of bed rail pt able to bring her self down to L side lying and placed pillow between her knees  Transfers Overall transfer level: Needs assistance Equipment used: Rolling walker (2 wheeled) Transfers: Sit to/from Stand Sit to Stand: Min guard;Supervision         General transfer comment: verbal cues to push up from chair and to reach back for chair, not depend on RW  Ambulation/Gait Ambulation/Gait assistance: Supervision Gait Distance (Feet): 130 Feet Assistive device: Rolling walker (2 wheeled) Gait Pattern/deviations: Step-through  pattern;Decreased stride length;Trunk flexed Gait velocity: dec Gait velocity interpretation: 1.31 - 2.62 ft/sec, indicative of limited community ambulator General Gait Details: verbal cues to stay in walker and rely more on LEs than pushing down through UEs, verbal cues to maintain upright position and not push walker to far forward promoting back flexion  Stairs Stairs: Yes Stairs assistance: Min guard Stair Management: One rail Right;Step to pattern;Sideways Number of Stairs: 3 General stair comments: pt with weak R LE, pt cautious on stairs but able to complete sideways safely  Wheelchair Mobility    Modified Rankin (Stroke Patients Only)       Balance Overall balance assessment: Needs assistance Sitting-balance support: No upper extremity supported;Feet supported Sitting balance-Leahy Scale: Fair     Standing balance support: During functional activity;No upper extremity supported;Bilateral upper extremity supported Standing balance-Leahy Scale: Fair Standing balance comment: preference to UE support dynamically                             Pertinent Vitals/Pain Pain Assessment: 0-10 Pain Score: 8  Pain Location: back-incisional Pain Descriptors / Indicators: Discomfort;Operative site guarding Pain Intervention(s): Monitored during session    Home Living Family/patient expects to be discharged to:: Private residence Living Arrangements: Spouse/significant other Available Help at Discharge: Family;Available 24 hours/day Type of Home: House Home Access: Stairs to enter Entrance Stairs-Rails: Psychiatric nurse of Steps: 3 Home Layout: One level Home Equipment: Walker - 2 wheels;Bedside commode      Prior Function Level of Independence: Independent         Comments: driving and working  Hand Dominance   Dominant Hand: Right    Extremity/Trunk Assessment   Upper Extremity Assessment Upper Extremity Assessment: Defer to OT  evaluation    Lower Extremity Assessment Lower Extremity Assessment: Generalized weakness    Cervical / Trunk Assessment Cervical / Trunk Assessment: Other exceptions Cervical / Trunk Exceptions: s/p lumbar sx  Communication   Communication: No difficulties  Cognition Arousal/Alertness: Awake/alert Behavior During Therapy: WFL for tasks assessed/performed Overall Cognitive Status: Within Functional Limits for tasks assessed                                        General Comments General comments (skin integrity, edema, etc.): VSS    Exercises     Assessment/Plan    PT Assessment Patient needs continued PT services  PT Problem List Decreased strength;Decreased activity tolerance;Decreased balance;Decreased mobility;Decreased knowledge of use of DME       PT Treatment Interventions DME instruction;Gait training;Stair training;Functional mobility training;Therapeutic activities;Therapeutic exercise;Balance training;Neuromuscular re-education    PT Goals (Current goals can be found in the Care Plan section)  Acute Rehab PT Goals Patient Stated Goal: to get home  PT Goal Formulation: With patient Time For Goal Achievement: 11/25/18 Potential to Achieve Goals: Good    Frequency Min 5X/week   Barriers to discharge        Co-evaluation               AM-PAC PT "6 Clicks" Mobility  Outcome Measure Help needed turning from your back to your side while in a flat bed without using bedrails?: A Little Help needed moving from lying on your back to sitting on the side of a flat bed without using bedrails?: A Little Help needed moving to and from a bed to a chair (including a wheelchair)?: A Little Help needed standing up from a chair using your arms (e.g., wheelchair or bedside chair)?: A Little Help needed to walk in hospital room?: A Little Help needed climbing 3-5 steps with a railing? : A Little 6 Click Score: 18    End of Session Equipment  Utilized During Treatment: Back brace Activity Tolerance: Patient tolerated treatment well Patient left: in bed;with call bell/phone within reach Nurse Communication: Mobility status PT Visit Diagnosis: Unsteadiness on feet (R26.81);Difficulty in walking, not elsewhere classified (R26.2)    Time: 5625-6389 PT Time Calculation (min) (ACUTE ONLY): 21 min   Charges:   PT Evaluation $PT Eval Moderate Complexity: 1 Mod          Kittie Plater, PT, DPT Acute Rehabilitation Services Pager #: [phone] Office #: [phone]   Berline Lopes 11/18/2018, 12:34 PM

## 2018-11-19 LAB — GLUCOSE, CAPILLARY
Glucose-Capillary: 250 mg/dL — ABNORMAL HIGH (ref 70–99)
Glucose-Capillary: 216 mg/dL — ABNORMAL HIGH (ref 70–99)

## 2018-11-19 MED ORDER — CYCLOBENZAPRINE HCL 10 MG PO TABS: 10.0000 mg | ORAL_TABLET | Freq: Three times a day (TID) | ORAL | 0 refills | Status: DC | PRN

## 2018-11-19 MED ORDER — DOCUSATE SODIUM 100 MG PO CAPS: 100.0000 mg | ORAL_CAPSULE | Freq: Two times a day (BID) | ORAL | 0 refills | Status: DC

## 2018-11-19 MED ORDER — OXYCODONE HCL 10 MG PO TABS: 10.0000 mg | ORAL_TABLET | ORAL | 0 refills | Status: DC | PRN

## 2018-11-19 MED FILL — CYCLOBENZAPRINE 10 MG TAB: 10 | 10 days supply | Qty: 30 | Fill #0

## 2018-11-19 MED FILL — oxyCODONE HCL 10 MG TABS: 10 | 7 days supply | Qty: 42 | Fill #0

## 2018-11-19 NOTE — Progress Notes (Signed)
Occupational Therapy Treatment Patient Details Name: [name] MRN: [id number] DOB: 1960/08/24 Today's Date: 11/19/2018    History of present illness Pt is a 58 y/o s/p L2-3 decompression and fusion. PMH: arthritis, depression, ACDF, cervical fusion, prior lumbar fusion, DM, pancreatitis, sleep apnea.    OT comments  Pt progressing well. Supervision for transfers given increased time. Reviewed AE for LB self care, min assist required for shoes.  Min assist required for simulated tub transfers, poor clearance of R LE--agreeable to have spouse assist.  Will follow.    Follow Up Recommendations  No OT follow up;Supervision - Intermittent    Equipment Recommendations  Tub/shower seat    Recommendations for Other Services      Precautions / Restrictions Precautions Precautions: Back Precaution Booklet Issued: Yes (comment) Precaution Comments: reviewed with pt  Required Braces or Orthoses: Spinal Brace Spinal Brace: Lumbar corset;Other (comment)(on upon entry ) Restrictions Weight Bearing Restrictions: No       Mobility Bed Mobility               General bed mobility comments: OOB upon entry  Transfers Overall transfer level: Needs assistance Equipment used: Rolling walker (2 wheeled) Transfers: Sit to/from Stand Sit to Stand: Supervision         General transfer comment: pt with good recall of hand placement and safety, increased time and effort required    Balance Overall balance assessment: Needs assistance Sitting-balance support: No upper extremity supported;Feet supported Sitting balance-Leahy Scale: Fair     Standing balance support: During functional activity;No upper extremity supported;Bilateral upper extremity supported Standing balance-Leahy Scale: Fair Standing balance comment: preference to UE support dynamically                           ADL either performed or assessed with clinical judgement   ADL Overall ADL's : Needs  assistance/impaired             Lower Body Bathing: Minimal assistance;Cueing for back precautions;Cueing for compensatory techniques;With adaptive equipment;Sit to/from stand Lower Body Bathing Details (indicate cue type and reason): reviewed use of long handled sponge and bathing sitting      Lower Body Dressing: Minimal assistance;Sit to/from stand;With adaptive equipment;Cueing for compensatory techniques;Cueing for back precautions Lower Body Dressing Details (indicate cue type and reason): pt utilized AE for LB dressing, using reacher to doff socks, sock aide to don socks, shoe horn for shoes with min assist Toilet Transfer: Supervision/safety;Ambulation;Regular Toilet;RW Toilet Transfer Details (indicate cue type and reason): increased time for sit to stand, but no assist required     Tub/ Shower Transfer: Tub transfer;Minimal assistance;Ambulation;Shower Technical sales engineer Details (indicate cue type and reason): simulated in room using RW, reverse step technique with min assist; decreased clearance of R LE over threshold--reviewed have spouse assist and technique  Functional mobility during ADLs: Supervision/safety;Rolling walker       Vision       Perception     Praxis      Cognition Arousal/Alertness: Awake/alert Behavior During Therapy: WFL for tasks assessed/performed Overall Cognitive Status: Within Functional Limits for tasks assessed                                          Exercises     Shoulder Instructions       General Comments  Pertinent Vitals/ Pain       Pain Assessment: Faces Faces Pain Scale: Hurts little more Pain Location: back-incisional Pain Descriptors / Indicators: Discomfort;Operative site guarding Pain Intervention(s): Monitored during session;Repositioned  Home Living                                          Prior Functioning/Environment              Frequency   Min 2X/week        Progress Toward Goals  OT Goals(current goals can now be found in the care plan section)  Progress towards OT goals: Progressing toward goals  Acute Rehab OT Goals Patient Stated Goal: to get home  OT Goal Formulation: With patient  Plan Discharge plan remains appropriate;Frequency remains appropriate    Co-evaluation                 AM-PAC OT "6 Clicks" Daily Activity     Outcome Measure   Help from another person eating meals?: None Help from another person taking care of personal grooming?: A Little Help from another person toileting, which includes using toliet, bedpan, or urinal?: A Little Help from another person bathing (including washing, rinsing, drying)?: A Little Help from another person to put on and taking off regular upper body clothing?: A Little Help from another person to put on and taking off regular lower body clothing?: A Little 6 Click Score: 19    End of Session Equipment Utilized During Treatment: Rolling walker;Back brace  OT Visit Diagnosis: Other abnormalities of gait and mobility (R26.89);Pain Pain - part of body: (back-incisional)   Activity Tolerance Patient tolerated treatment well   Patient Left in chair;with call bell/phone within reach   Nurse Communication Mobility status        Time: 9233-0076 OT Time Calculation (min): 19 min  Charges: OT General Charges $OT Visit: 1 Visit OT Treatments $Self Care/Home Management : 8-22 mins  Delight Stare, [place] Pager [phone] Office [phone]    Delight Stare 11/19/2018, 10:20 AM

## 2018-11-19 NOTE — Discharge Instructions (Signed)

## 2018-11-19 NOTE — Progress Notes (Signed)
Physical Therapy Treatment Patient Details Name: ANIDA DEOL MRN: [id number] DOB: 01/08/1961 Today's Date: 11/19/2018    History of Present Illness Pt is a 58 y/o s/p L2-3 decompression and fusion. PMH: arthritis, depression, ACDF, cervical fusion, prior lumbar fusion, DM, pancreatitis, sleep apnea.     PT Comments    Pt with improved alertness. Required re-education on "BLT" /back precautions. Pt amb with and without RW with good posture however fatigues quickly due to body habitus and sedentary lifestyle. Acute PT to cont to follow.    Follow Up Recommendations  No PT follow up;Supervision - Intermittent     Equipment Recommendations  None recommended by PT    Recommendations for Other Services       Precautions / Restrictions Precautions Precautions: Back Precaution Booklet Issued: Yes (comment) Precaution Comments: pt unable to recall "BLT", with verbal cues patient able to recall 3/3 Required Braces or Orthoses: Spinal Brace Spinal Brace: Lumbar corset Spinal Brace Comments: pt gained weigth since last surgery, brace very snug fitting Restrictions Weight Bearing Restrictions: No    Mobility  Bed Mobility               General bed mobility comments: up in hallway walking  Transfers Overall transfer level: Needs assistance Equipment used: Rolling walker (2 wheeled) Transfers: Sit to/from Stand Sit to Stand: Supervision         General transfer comment: pt with safe and controlled descent into chair  Ambulation/Gait Ambulation/Gait assistance: Supervision Gait Distance (Feet): 200 Feet Assistive device: Rolling walker (2 wheeled);None Gait Pattern/deviations: Step-through pattern;Decreased stride length;Trunk flexed Gait velocity: dec Gait velocity interpretation: 1.31 - 2.62 ft/sec, indicative of limited community ambulator General Gait Details: pt ambulated 1/2 without AD and 1/2 with, pt with onset of fatigue, poor activity tolerance due to body  habitus and sedentary lifestayle   Stairs Stairs: Yes Stairs assistance: Supervision Stair Management: One rail Right;Step to pattern;Sideways Number of Stairs: 3     Wheelchair Mobility    Modified Rankin (Stroke Patients Only)       Balance Overall balance assessment: Needs assistance Sitting-balance support: No upper extremity supported;Feet supported Sitting balance-Leahy Scale: Fair     Standing balance support: During functional activity;No upper extremity supported;Bilateral upper extremity supported Standing balance-Leahy Scale: Fair Standing balance comment: preference to UE support dynamically                            Cognition Arousal/Alertness: Awake/alert Behavior During Therapy: WFL for tasks assessed/performed Overall Cognitive Status: Within Functional Limits for tasks assessed                                        Exercises      General Comments General comments (skin integrity, edema, etc.): VSS      Pertinent Vitals/Pain Pain Assessment: 0-10 Pain Score: 4  Faces Pain Scale: Hurts little more Pain Location: back-incisional Pain Descriptors / Indicators: Discomfort;Operative site guarding Pain Intervention(s): Monitored during session    Home Living                      Prior Function            PT Goals (current goals can now be found in the care plan section) Acute Rehab PT Goals Patient Stated Goal: home asap Progress towards PT goals: Progressing  toward goals    Frequency    Min 5X/week      PT Plan Current plan remains appropriate    Co-evaluation              AM-PAC PT "6 Clicks" Mobility   Outcome Measure  Help needed turning from your back to your side while in a flat bed without using bedrails?: A Little Help needed moving from lying on your back to sitting on the side of a flat bed without using bedrails?: A Little Help needed moving to and from a bed to a chair  (including a wheelchair)?: A Little Help needed standing up from a chair using your arms (e.g., wheelchair or bedside chair)?: A Little Help needed to walk in hospital room?: A Little Help needed climbing 3-5 steps with a railing? : A Little 6 Click Score: 18    End of Session Equipment Utilized During Treatment: Back brace Activity Tolerance: Patient tolerated treatment well Patient left: in chair;with call bell/phone within reach Nurse Communication: Mobility status PT Visit Diagnosis: Unsteadiness on feet (R26.81);Difficulty in walking, not elsewhere classified (R26.2)     Time: 0037-0488 PT Time Calculation (min) (ACUTE ONLY): 12 min  Charges:  $Gait Training: 8-22 mins                     Kittie Plater, PT, DPT Acute Rehabilitation Services Pager #: [phone] Office #: [phone]    Berline Lopes 11/19/2018, 10:48 AM

## 2018-11-19 NOTE — Discharge Summary (Signed)
Physician Discharge Summary  Patient ID: [name] MRN: [id number] DOB/AGE: 1960-09-27 58 y.o.  Admit date: 11/17/2018 Discharge date: 11/19/2018  Admission Diagnoses: Lumbar stenosis with neurogenic claudication  Discharge Diagnoses:  Active Problems:   Lumbar stenosis with neurogenic claudication   Discharged Condition: good  Hospital Course: Patient underwent posterior lumbar decompression and fusion at L2-3, extending the previous fusion from L2-L5. Patient has done well post operatively. She has worked with physical and occupational therapies. She is ready to discharge home.  Consults: rehabilitation medicine  Significant Diagnostic Studies: radiology: Dg Lumbar Spine 2-3 Views  Result Date: 11/17/2018 CLINICAL DATA:  Lumbar spine surgery. EXAM: DG C-ARM 61-120 MIN; LUMBAR SPINE - 2-3 VIEW COMPARISON:  06/18/2016. FINDINGS: Lumbar spine difficult to number. Postsurgical changes noted of the lumbar spine hardware appears to be intact. Anatomic alignment. 0 minutes 13 seconds fluoroscopy time utilized. IMPRESSION: Postsurgical changes lumbar spine. Electronically Signed   By: Marcello Moores  Register   On: 11/17/2018 13:57   Dg C-arm 1-60 Min  Result Date: 11/17/2018 CLINICAL DATA:  Lumbar spine surgery. EXAM: DG C-ARM 61-120 MIN; LUMBAR SPINE - 2-3 VIEW COMPARISON:  06/18/2016. FINDINGS: Lumbar spine difficult to number. Postsurgical changes noted of the lumbar spine hardware appears to be intact. Anatomic alignment. 0 minutes 13 seconds fluoroscopy time utilized. IMPRESSION: Postsurgical changes lumbar spine. Electronically Signed   By: Marcello Moores  Register   On: 11/17/2018 13:57     Treatments: surgery: Bilateral L2-3 laminotomy/foraminotomies/medial facetectomy to decompress the bilateral L2 and L3 nerve roots(the work required to do this was in addition to the work required to do the posterior lumbar interbody fusion because of the patient's spinal stenosis, facet arthropathy. Etc.  requiring a wide decompression of the nerve roots.);  L2-3 transforaminal lumbar interbody fusion with local morselized autograft bone and Zimmer DBM; insertion of interbody prosthesis at L2-3 (globus peek expandable interbody prosthesis); posterior segmental instrumentation from L2 to L5 with globus titanium pedicle screws and rods; posterior lateral arthrodesis at L2-3 bilaterally with local morselized autograft bone and Zimmer DBM; exploration of lumbar fusion/removal of lumbar hardware  Discharge Exam: Blood pressure 102/71, pulse 98, temperature 97.7 F (36.5 C), temperature source Oral, resp. rate 19, height 5\' 3"  (1.6 m), weight 115.7 kg, SpO2 94 %.   Alert and oriented x 4 PERRLA CN II-XII grossly intact MAE Strength 5/5 BUE, BLE Incision is clean, dry, and intact; Covered with honeycomb dressing  Disposition: Discharge disposition: 01-Home or Self Care        Allergies as of 11/19/2018      Reactions   Biaxin [clarithromycin] Nausea And Vomiting, Nausea Only      Medication List    TAKE these medications   atorvastatin 40 MG tablet Commonly known as: LIPITOR Take 40 mg by mouth daily at 6 PM.   cyclobenzaprine 10 MG tablet Commonly known as: FLEXERIL Take 1 tablet (10 mg total) by mouth 3 (three) times daily as needed for muscle spasms.   docusate sodium 100 MG capsule Commonly known as: COLACE Take 1 capsule (100 mg total) by mouth 2 (two) times daily.   DULoxetine 60 MG capsule Commonly known as: CYMBALTA Take 60 mg by mouth at bedtime.   gabapentin 300 MG capsule Commonly known as: NEURONTIN Take 300-900 mg by mouth See admin instructions. 300 mg in the morning and 900 mg at night   HumuLIN R U-500 KwikPen 500 UNIT/ML kwikpen Generic drug: insulin regular human CONCENTRATED Inject 35-45 Units into the skin See admin instructions.  45 units in the morning, 40 units at lunch, and 35 units at dinner   losartan 25 MG tablet Commonly known as: COZAAR Take 25  mg by mouth every morning.   metoprolol succinate 25 MG 24 hr tablet Commonly known as: TOPROL-XL Take 25 mg by mouth every morning.   nabumetone 750 MG tablet Commonly known as: RELAFEN Take 1 tablet by mouth 2 (two) times a day.   omeprazole 40 MG capsule Commonly known as: PRILOSEC Take 40 mg by mouth daily. Pt may take additional dose if needed   ondansetron 4 MG tablet Commonly known as: ZOFRAN Take 1 tablet (4 mg total) by mouth every 8 (eight) hours as needed for nausea or vomiting.   Oxycodone HCl 10 MG Tabs Take 1 tablet (10 mg total) by mouth every 4 (four) hours as needed for severe pain ((score 7 to 10)).   pioglitazone 15 MG tablet Commonly known as: ACTOS Take 15 mg by mouth daily.   rizatriptan 10 MG tablet Commonly known as: MAXALT Take 10 mg by mouth as needed for migraine. May repeat in 2 hours if needed   traMADol 50 MG tablet Commonly known as: ULTRAM Take 50-100 mg by mouth 2 (two) times daily as needed for moderate pain (depends on pain level if 50-100 mg).   Trulicity 1.5 QA/0.6OR Sopn Generic drug: Dulaglutide Inject 1.5 mg into the skin once a week. Mondays   Unifine Pentips 31G X 5 MM Misc Generic drug: Insulin Pen Needle   zolpidem 5 MG tablet Commonly known as: AMBIEN Take 5 mg by mouth at bedtime as needed for sleep. Reported on 08/30/2015      Follow-up Information    Newman Pies, MD Follow up.   Specialty: Neurosurgery Contact information: [address] [place] [address] [place] [address] [phone]           Signed: Patricia Nettle 11/19/2018, 12:31 PM

## 2018-11-19 NOTE — Plan of Care (Signed)
Patient alert and oriented, mae's well, voiding adequate amount of urine, swallowing without difficulty, no c/o pain at time of discharge. Patient discharged home with family. Script and discharged instructions given to patient. Patient and family stated understanding of instructions given. Patient has an appointment with Dr. Jenkins   

## 2018-11-20 ENCOUNTER — Other Ambulatory Visit: Payer: Self-pay | Admitting: *Deleted

## 2018-11-20 MED FILL — Heparin Sodium (Porcine) Inj 1000 Unit/ML: INTRAMUSCULAR | Qty: 30 | Status: AC

## 2018-11-20 MED FILL — Sodium Chloride IV Soln 0.9%: INTRAVENOUS | Qty: 1000 | Status: AC

## 2018-11-20 NOTE — Patient Outreach (Signed)
[facility]) Care Management  11/20/2018  [name] 1960/06/26 [id number]   Transition of care telephone call  Referral received: 11/18/18 Initial outreach: 11/20/18 Insurance: [place]  Reached patient via preferred (mobile) number in order to complete transition of care assessment. [name] says she does not feel up to completing the transition of care assessment and requested a call back tomorrow. This RNCM advised her that the [place] Management office is closed on 11/21/18 so she requested a return call next week. She did verify that she has all of the medications that were prescribed at discharge and that she had no immediate needs.    Objective: [name]  was hospitalized at [facility] from  6/29- 11/19/18 for bilateral L2-3 laminotomy/foraminotomies/medial facetectomy to decompress the bilateral L2 and L3 nerve roots(the work required to do this was in addition to the work required to do the posterior lumbar interbody fusion because of the patient's spinal stenosis, facet arthropathy. Etc. requiring a wide decompression of the nerve roots.);  L2-3 transforaminal lumbar interbody fusion with local morselized autograft bone and Zimmer DBM; insertion of interbody prosthesis at L2-3 (globus peek expandable interbody prosthesis); posterior segmental instrumentation from L2 to L5 with globus titanium pedicle screws and rods; posterior lateral arthrodesis at L2-3 bilaterally with local morselized autograft bone and Zimmer DBM; exploration of lumbar fusion/removal of lumbar hardware   Comorbidities include: migraine headaches, previous lumbar back surgery, sleep apnea, asthma, GERD, Type 2 DM with most recent Hgb A1C= 8.6% on 11/14/18, cervical radiculitis and cervical spinal stenosis, arthritis, kidney stones, depression, hyperlipidemia.  Plan: RNCM will attempt another outreach within 4 business days.  Barrington Ellison RN,CCM,CDE [place] Management Coordinator Office Phone [phone] Office Fax [phone]

## 2018-11-25 MED FILL — oxyCODONE HCL 10 MG TABS: 10 | 7 days supply | Qty: 42 | Fill #0

## 2018-11-27 MED FILL — PIOGLITAZONE HCL 30 MG TAB: 30 | 90 days supply | Qty: 90 | Fill #0

## 2018-11-27 MED FILL — OMEPRAZOLE DR 40 MG CAPSULE: 40 | 90 days supply | Qty: 90 | Fill #0

## 2018-11-27 MED FILL — ATORVASTATIN 40 MG TABLET: 40 | 90 days supply | Qty: 90 | Fill #0

## 2018-11-27 MED FILL — HUMULIN R 500 UNITS/ML KWIK: 500 | 25 days supply | Qty: 6 | Fill #0

## 2018-11-27 MED FILL — DULoxetine HCL 60 MG CPEP: 60 | 90 days supply | Qty: 90 | Fill #0

## 2018-11-27 MED FILL — METOPROLOL SUCCINATE ER 25: 25 | 90 days supply | Qty: 90 | Fill #0

## 2018-11-27 MED FILL — GABAPENTIN 300 MG CAPSULE: 300 | 90 days supply | Qty: 270 | Fill #0

## 2018-11-27 MED FILL — TRULICITY 1.5 MG/0.5 ML PEN: 1.5 | 28 days supply | Qty: 2 | Fill #0

## 2018-11-27 MED FILL — LOSARTAN POTASSIUM 25 MG TA: 25 | 90 days supply | Qty: 90 | Fill #0

## 2018-12-03 ENCOUNTER — Other Ambulatory Visit: Payer: Self-pay | Admitting: *Deleted

## 2018-12-03 ENCOUNTER — Ambulatory Visit: Payer: Self-pay | Admitting: *Deleted

## 2018-12-03 NOTE — Patient Outreach (Signed)
[facility]) Care Management  12/03/2018  TERRILEE DUDZIK [birth year] [id number]   Transition of care telephone call  Referral received: 11/18/18 Initial outreach: 11/20/18 Insurance: [place]  Unsuccessful telephone call, second outreach attempt, to patient's mobile number in order to complete transition of care assessment; no answer, left HIPAA compliant voicemail message requesting return call.   Objective: Mrs Ellingsen  was hospitalized at [facility] from 6/29- 11/19/18 for bilateral L2-3 laminotomy/foraminotomies/medial facetectomy to decompress the [place] and L3 nerve roots(the work required to do this was in addition to the work required to do the posterior lumbar interbody fusion because of the patient's spinal stenosis, facet arthropathy. Etc. requiring a wide decompression of the nerve roots.);L2-3transforaminal lumbar interbody fusion with local morselized autograft bone and Zimmer DBM; insertion of interbody prosthesis atL2-3 (globus peek expandable interbody prosthesis); posteriorsegmental instrumentation fromL2toL5 with globus titanium pedicle screws and rods; posterior lateral arthrodesis atL2-3 bilaterally with local morselized autograft bone and Zimmer DBM;exploration of lumbar fusion/removal of lumbar hardware    Comorbidities include: migraine headaches, previous lumbar back surgery, sleep apnea, asthma, GERD, Type 2 DM with most recent Hgb A1C= 8.6% on 11/14/18, cervical radiculitis and cervical spinal stenosis, arthritis, kidney stones, depression, hyperlipidemia.  Plan: This RNCM will route unsuccessful outreach letter with [place] Management pamphlet and 24 hour Nurse Advice Line Magnet to [place] Management clinical pool to be mailed to patient's home address. This RNCM will attempt another outreach within 4 business days.  Barrington Ellison RN,CCM,CDE [place] Management Coordinator Office Phone [phone] Office Fax [phone]

## 2018-12-08 ENCOUNTER — Other Ambulatory Visit: Payer: Self-pay | Admitting: *Deleted

## 2018-12-08 ENCOUNTER — Encounter: Payer: Self-pay | Admitting: *Deleted

## 2018-12-08 DIAGNOSIS — F329 Major depressive disorder, single episode, unspecified: Secondary | ICD-10-CM | POA: Insufficient documentation

## 2018-12-08 DIAGNOSIS — F32A Depression, unspecified: Secondary | ICD-10-CM | POA: Insufficient documentation

## 2018-12-08 DIAGNOSIS — G4730 Sleep apnea, unspecified: Secondary | ICD-10-CM | POA: Insufficient documentation

## 2018-12-08 NOTE — Patient Outreach (Signed)
[name] [facility]) Care Richards  12/08/2018  [name] 10-05-1960 [id number]   Transition of care telephone call  Referral received: 11/18/18 Initial outreach: 11/20/18 Insurance: [place]  Subjective: Returned all to [name] after she left a message for this RNCM at 3:42 pm on 12/05/18 in response to message left for her on 7/2 by this [facility] requesting a return call. 2 HIPAA identifiers verified. Explained purpose of call and completed transition of care assessment.  [name] states she is doing well, denies post-operative problems, says surgical incision is unremarkable, states surgical pain well managed with prescribed medications, tolerating diet, denies bowel or bladder problems currently although she did not to treat constipation with Colace in the immediate post discharge days. Says she is ambulating without an assistive device although she does have a rolling walker from previous surgery.  Spouse is assisting with her recovery.  She voiced concern that her husband was not advised of her discharge instructions when she was escorted to the car as he could not be present in her hospital room due to Covid 19 visitor restrictions. She says she communicated this concern on this the survey she was sent.  She states she is an active member of [facility] [employer] Richards chronic disease Richards program.  She  says she is not sure if she purchased the hospital indemnity insurance. She says she uses a [facility] outpatient pharmacy.  She denies educational needs related to staying safe during the COVID 19 pandemic.   Objective:  [name]3 [facility]/foraminotomies/medial facetectomy to decompress the [place] and L3 nerve roots(the work required to do this was in addition to the work required to do the posterior lumbar interbody fusion because of the patient's spinal stenosis,  facet arthropathy. Etc. requiring a wide decompression of the nerve roots.);L2-3transforaminal lumbar interbody fusion with local morselized autograft bone and Zimmer DBM; insertion of interbody prosthesis atL2-3 (globus peek expandable interbody prosthesis); posteriorsegmental instrumentation fromL2toL5 with globus titanium pedicle screws and rods; posterior lateral arthrodesis atL2-3 bilaterally with local morselized autograft bone and Zimmer DBM;exploration of lumbar fusion/removal of lumbar hardware Comorbidities include: migraine headaches, previous lumbar back surgery, sleep apnea, asthma, GERD, Type 2 DM with most recent Hgb A1C= 8.6% on 11/14/18, cervical radiculitis and cervical spinal stenosis, arthritis, kidney stones, depression, hyperlipidemia.  Assessment:  Patient voices good understanding of all discharge instructions.  See transition of care flowsheet for assessment details.  Plan:  With [name]'s permission used her personal email address and emailed the contact number for [name], the hyperlink to [employer] and the phone number for [name] in the event she did purchase the hospital indemnity insurance.  Reviewed hospital discharge diagnosis of L2-L3 laminotomy/foraminotomies/medial facetectomy and treatment plan using hospital discharge instructions, assessing medication adherence, reviewing problems requiring provider notification, and discussing the importance of follow up with surgeon, primary care provider and/or specialists as directed. Reviewed 's [employer] Richards 2020 Wellness Requirements of: Completing the computerized Health Assessment and the Health Action Step with [facility]) by January 20 2019 AND have an annual physical between May 21, 2017 and January 20, 2019 in order to qualify for the 2021 Healthy Lifestyle Premium. No ongoing care Richards needs  identified so will close case to [name] services.  Barrington Ellison RN,CCM,CDE [place] Richards Coordinator Office Phone [phone] Office Fax [phone]

## 2018-12-09 MED FILL — HYDROCODON-APAP 5-325: 5-325 | 8 days supply | Qty: 50 | Fill #0

## 2018-12-25 MED FILL — TRULICITY 1.5 MG/0.5 ML PEN: 1.5 | 28 days supply | Qty: 2 | Fill #1

## 2018-12-25 MED FILL — traMADol HCL 50 MG TABS: 50 | 30 days supply | Qty: 60 | Fill #1

## 2019-02-03 ENCOUNTER — Encounter (HOSPITAL_COMMUNITY): Payer: Self-pay | Admitting: Neurosurgery

## 2019-02-05 ENCOUNTER — Ambulatory Visit
Admission: RE | Admit: 2019-02-05 | Discharge: 2019-02-05 | Disposition: A | Discharge status: Home / Self Care | Payer: No Typology Code available for payment source | Source: Ambulatory Visit | Attending: Internal Medicine | Admitting: Internal Medicine

## 2019-02-05 DIAGNOSIS — Z1231 Encounter for screening mammogram for malignant neoplasm of breast: Secondary | ICD-10-CM | POA: Insufficient documentation

## 2019-02-09 MED FILL — TRULICITY 1.5 MG/0.5 ML PEN: 1.5 | 28 days supply | Qty: 2 | Fill #2

## 2019-02-09 MED FILL — HUMULIN R 500 UNITS/ML KWIK: 500 | 25 days supply | Qty: 6 | Fill #1

## 2019-02-10 ENCOUNTER — Other Ambulatory Visit: Payer: Self-pay | Admitting: Internal Medicine

## 2019-02-10 DIAGNOSIS — R928 Other abnormal and inconclusive findings on diagnostic imaging of breast: Secondary | ICD-10-CM

## 2019-02-10 DIAGNOSIS — N6310 Unspecified lump in the right breast, unspecified quadrant: Secondary | ICD-10-CM

## 2019-02-10 DIAGNOSIS — N6489 Other specified disorders of breast: Secondary | ICD-10-CM

## 2019-02-20 ENCOUNTER — Ambulatory Visit
Admission: RE | Admit: 2019-02-20 | Discharge: 2019-02-20 | Disposition: A | Discharge status: Home / Self Care | Payer: No Typology Code available for payment source | Source: Ambulatory Visit | Attending: Internal Medicine | Admitting: Internal Medicine

## 2019-02-20 DIAGNOSIS — N6310 Unspecified lump in the right breast, unspecified quadrant: Secondary | ICD-10-CM

## 2019-02-20 DIAGNOSIS — N6489 Other specified disorders of breast: Secondary | ICD-10-CM | POA: Diagnosis present

## 2019-02-20 DIAGNOSIS — R928 Other abnormal and inconclusive findings on diagnostic imaging of breast: Secondary | ICD-10-CM

## 2019-03-03 MED FILL — LOSARTAN POTASSIUM 25 MG TA: 25 | 90 days supply | Qty: 90 | Fill #1

## 2019-03-03 MED FILL — METOPROLOL SUCCINATE ER 25: 25 | 90 days supply | Qty: 90 | Fill #1

## 2019-03-03 MED FILL — DULoxetine HCL 60 MG CPEP: 60 | 90 days supply | Qty: 90 | Fill #1

## 2019-03-03 MED FILL — PIOGLITAZONE HCL 30 MG TAB: 30 | 90 days supply | Qty: 90 | Fill #1

## 2019-03-03 MED FILL — TRULICITY 1.5 MG/0.5 ML PEN: 1.5 | 28 days supply | Qty: 2 | Fill #3

## 2019-03-03 MED FILL — OMEPRAZOLE DR 40 MG CAPSULE: 40 | 90 days supply | Qty: 90 | Fill #1

## 2019-03-03 MED FILL — ATORVASTATIN 40 MG TABLET: 40 | 90 days supply | Qty: 90 | Fill #1

## 2019-03-03 MED FILL — NABUMETONE 750 MG TABS: 750 | 90 days supply | Qty: 180 | Fill #1

## 2019-04-06 MED FILL — HUMULIN R 500 UNITS/ML KWIK: 500 | 25 days supply | Qty: 6 | Fill #2

## 2019-04-06 MED FILL — TRULICITY 1.5 MG/0.5 ML PEN: 1.5 | 28 days supply | Qty: 2 | Fill #0

## 2019-04-13 MED FILL — ONDANSETRON HCL 4 MG TABLET: 4 | 10 days supply | Qty: 20 | Fill #0

## 2019-04-13 MED FILL — TRULICITY 3 MG/0.5ML SOPN: 3 | 28 days supply | Qty: 2 | Fill #0

## 2019-04-27 MED FILL — HUMULIN R 500 UNITS/ML KWIK: 500 | 25 days supply | Qty: 6 | Fill #0

## 2019-04-28 MED FILL — BOOSTRIX VACCINE SYRINGE: 5-2.5-18.5 | 1 days supply | Qty: 1 | Fill #0

## 2019-04-28 MED FILL — SHINGRIX 50 MCG SUS: 50 | 30 days supply | Qty: 1 | Fill #0

## 2019-05-18 MED FILL — TRULICITY 4.5 MG/0.5ML SOPN: 4.5 | 28 days supply | Qty: 2 | Fill #0

## 2019-05-18 MED FILL — ONDANSETRON HCL 4 MG TABLET: 4 | 10 days supply | Qty: 20 | Fill #1

## 2019-06-10 MED FILL — TRULICITY 4.5 MG/0.5ML SOPN: 4.5 | 28 days supply | Qty: 2 | Fill #1

## 2019-06-10 MED FILL — HUMULIN R 500 UNITS/ML KWIK: 500 | 25 days supply | Qty: 6 | Fill #1

## 2019-07-15 MED FILL — NABUMETONE 750 MG TABS: 750 | 90 days supply | Qty: 180 | Fill #0

## 2019-07-15 MED FILL — TRULICITY 4.5 MG/0.5ML SOPN: 4.5 | 28 days supply | Qty: 2 | Fill #2

## 2019-07-15 MED FILL — METOPROLOL SUCCINATE ER 25: 25 | 90 days supply | Qty: 90 | Fill #0

## 2019-07-15 MED FILL — ATORVASTATIN 40 MG TABLET: 40 | 90 days supply | Qty: 90 | Fill #0

## 2019-07-15 MED FILL — GABAPENTIN 300 MG CAPSULE: 300 | 90 days supply | Qty: 360 | Fill #0

## 2019-07-15 MED FILL — HUMULIN R 500 UNITS/ML KWIK: 500 | 25 days supply | Qty: 6 | Fill #2

## 2019-07-15 MED FILL — OMEPRAZOLE 40 MG CPDR: 40 | 90 days supply | Qty: 90 | Fill #0

## 2019-07-15 MED FILL — LOSARTAN POTASSIUM 25 MG TA: 25 | 90 days supply | Qty: 90 | Fill #0

## 2019-07-15 MED FILL — PIOGLITAZONE HCL 30 MG TAB: 30 | 90 days supply | Qty: 90 | Fill #0

## 2019-07-15 MED FILL — DULoxetine HCL 60 MG CPEP: 60 | 90 days supply | Qty: 45 | Fill #0

## 2019-07-16 MED FILL — RIZATRIPTAN BENZOATE 10 MG: 10 | 30 days supply | Qty: 10 | Fill #0

## 2019-07-20 MED FILL — LOSARTAN POTASSIUM 25 MG TA: 25 | 90 days supply | Qty: 90 | Fill #0

## 2019-07-24 MED FILL — SHINGRIX 50 MCG SUS: 50 | 30 days supply | Qty: 1 | Fill #1

## 2019-07-28 MED FILL — ONDANSETRON HCL 4 MG TABLET: 4 | 15 days supply | Qty: 30 | Fill #0

## 2019-08-24 MED FILL — TRULICITY 4.5 MG/0.5ML SOPN: 4.5 | 28 days supply | Qty: 2 | Fill #3

## 2019-08-31 ENCOUNTER — Encounter: Payer: Self-pay | Admitting: Obstetrics & Gynecology

## 2019-08-31 ENCOUNTER — Other Ambulatory Visit (HOSPITAL_COMMUNITY)
Admission: RE | Admit: 2019-08-31 | Discharge: 2019-08-31 | Disposition: A | Discharge status: Home / Self Care | Payer: No Typology Code available for payment source | Source: Ambulatory Visit | Attending: Obstetrics & Gynecology | Admitting: Obstetrics & Gynecology

## 2019-08-31 ENCOUNTER — Other Ambulatory Visit: Payer: Self-pay

## 2019-08-31 ENCOUNTER — Ambulatory Visit (INDEPENDENT_AMBULATORY_CARE_PROVIDER_SITE_OTHER): Payer: No Typology Code available for payment source | Admitting: Obstetrics & Gynecology

## 2019-08-31 VITALS — BP 120/82 | Ht 63.0 in | Wt 245.0 lb

## 2019-08-31 DIAGNOSIS — Z01419 Encounter for gynecological examination (general) (routine) without abnormal findings: Secondary | ICD-10-CM

## 2019-08-31 DIAGNOSIS — Z124 Encounter for screening for malignant neoplasm of cervix: Secondary | ICD-10-CM | POA: Diagnosis not present

## 2019-08-31 DIAGNOSIS — N952 Postmenopausal atrophic vaginitis: Secondary | ICD-10-CM

## 2019-08-31 NOTE — Patient Instructions (Signed)
PAP every year Mammogram every year (due Oct)    Call [phone] to schedule at [facility] Colonoscopy every 10 years Labs yearly (with PCP)   Atrophic Vaginitis  Atrophic vaginitis is a condition in which the tissues that line the vagina become dry and thin. This condition is most common in women who have stopped having regular menstrual periods (are in menopause). This usually starts when a woman is 42-59 years old. That is the time when a woman's estrogen levels begin to drop (decrease). Estrogen is a female hormone. It helps to keep the tissues of the vagina moist. It stimulates the vagina to produce a clear fluid that lubricates the vagina for sexual intercourse. This fluid also protects the vagina from infection. Lack of estrogen can cause the lining of the vagina to get thinner and dryer. The vagina may also shrink in size. It may become less elastic. Atrophic vaginitis tends to get worse over time as a woman's estrogen level drops. What are the causes? This condition is caused by the normal drop in estrogen that happens around the time of menopause. What increases the risk? Certain conditions or situations may lower a woman's estrogen level, leading to a higher risk for atrophic vaginitis. You are more likely to develop this condition if:  You are taking medicines that block estrogen.  You have had your ovaries removed.  You are being treated for cancer with X-ray (radiation) or medicines (chemotherapy).  You have given birth or are breastfeeding.  You are older than age 51.  You smoke. What are the signs or symptoms? Symptoms of this condition include:  Pain, soreness, or bleeding during sexual intercourse (dyspareunia).  Vaginal burning, irritation, or itching.  Pain or bleeding when a speculum is used in a vaginal exam (pelvic exam).  Having burning pain when passing urine.  Vaginal discharge that is brown or yellow. In some cases, there are no symptoms. How is this  diagnosed? This condition is diagnosed by taking a medical history and doing a physical exam. This will include a pelvic exam that checks the vaginal tissues. Though rare, you may also have other tests, including:  A urine test.  A test that checks the acid balance in your vagina (acid balance test). How is this treated? Treatment for this condition depends on how severe your symptoms are. Treatment may include:  Using an over-the-counter vaginal lubricant before sex.  Using a long-acting vaginal moisturizer. - REPLENS -  Using low-dose vaginal estrogen for moderate to severe symptoms that do not respond to other treatments. Options include creams, tablets, and inserts (vaginal rings). Before you use a vaginal estrogen, tell your health care provider if you have a history of: ? Breast cancer. ? Endometrial cancer. ? Blood clots. If you are not sexually active and your symptoms are very mild, you may not need treatment. Follow these instructions at home: Medicines  Take over-the-counter and prescription medicines only as told by your health care provider. Do not use herbal or alternative medicines unless your health care provider says that you can.  Use over-the-counter creams, lubricants, or moisturizers for dryness only as directed by your health care provider. General instructions  If your atrophic vaginitis is caused by menopause, discuss all of your menopause symptoms and treatment options with your health care provider.  Do not douche.  Do not use products that can make your vagina dry. These include: ? Scented feminine sprays. ? Scented tampons. ? Scented soaps.  Vaginal intercourse can help to improve  blood flow and elasticity of vaginal tissue. If it hurts to have sex, try using a lubricant or moisturizer just before having intercourse. Contact a health care provider if:  Your discharge looks different than normal.  Your vagina has an unusual smell.  You have new  symptoms.  Your symptoms do not improve with treatment.  Your symptoms get worse. Summary  Atrophic vaginitis is a condition in which the tissues that line the vagina become dry and thin. It is most common in women who have stopped having regular menstrual periods (are in menopause).  Treatment options include using vaginal lubricants and low-dose vaginal estrogen.  Contact a health care provider if your vagina has an unusual smell, or if your symptoms get worse or do not improve after treatment. This information is not intended to replace advice given to you by your health care provider. Make sure you discuss any questions you have with your health care provider. Document Revised: 04/19/2017 Document Reviewed: 01/31/2017 Elsevier Patient Education  2020 [employer].

## 2019-08-31 NOTE — Progress Notes (Signed)
HPI:      [name] is a 59 y.o. G1P1001 who LMP was in the past, she presents today for her annual examination.  The patient has no complaints today. The patient is sexually active. Herlast pap: approximate date 2020 and was normal and prior LEEP 2015; and last mammogram: approximate date 02/2019 and was normal.  The patient does perform self breast exams.  There is no notable family history of breast or ovarian cancer in her family. The patient is not taking hormone replacement therapy. Patient denies post-menopausal vaginal bleeding.   The patient has regular exercise: yes. The patient denies current symptoms of depression.  Reports urinary urgency without nocturia or LOU.  Daily.  GYN Hx: Last Colonoscopy:7 yrs ago. Normal.   PMHx: Past Medical History:  Diagnosis Date  . Arthritis   . Asthma without status asthmaticus 08/02/2015   Overview:  mild, not requiring treatment   . Cervical spinal stenosis   . Degeneration of intervertebral disc of cervical region 04/28/2015  . Depression   . Diabetes ([place])   . GERD (gastroesophageal reflux disease)   . Headache, migraine 08/02/2015  . Heartburn   . History of kidney stones   . HLD (hyperlipidemia)   . Kidney stones 07/18/2015  . Lumbar stenosis   . Pancreatitis   . Radiculopathy of cervical region   . Sleep apnea    does not wear cpap    Past Surgical History:  Procedure Laterality Date  . ANTERIOR CERVICAL DECOMP/DISCECTOMY FUSION N/A 04/20/2016   Procedure: CERVICAL THREE-FOUR  ANTERIOR CERVICAL DECOMPRESSION/DISCECTOMY/FUSION WITH PARTIAL REMOVAL OF PLATE AT CERVICAL FOUR;  Surgeon: Leeroy Cha, MD;  Location: [facility];  Service: Neurosurgery;  Laterality: N/A;  . [place]  . CERVICAL BIOPSY  W/ LOOP ELECTRODE EXCISION  08/12/2013  . CERVICAL FUSION  2009  . COLONOSCOPY    . COLPOSCOPY  03/26/2013  . CYSTOSCOPY WITH STENT PLACEMENT Right 08/24/2015   Procedure: CYSTOSCOPY WITH STENT PLACEMENT;  Surgeon:  Cleon Gustin, MD;  Location: [facility];  Service: Urology;  Laterality: Right;  . NEPHROLITHOTOMY Right 08/24/2015   Procedure: NEPHROLITHOTOMY PERCUTANEOUS WITH SURGEON TO GET ACCESS;  Surgeon: Cleon Gustin, MD;  Location: [facility];  Service: Urology;  Laterality: Right;  . TUBAL LIGATION     Family History  Problem Relation Age of Onset  . Heart failure Mother   . Heart attack Father   . Colon cancer Maternal Grandfather 10  . Kidney disease Neg Hx   . Bladder Cancer Neg Hx   . Breast cancer Neg Hx    Social History   Tobacco Use  . Smoking status: Never Smoker  . Smokeless tobacco: Never Used  Substance Use Topics  . Alcohol use: Yes    Alcohol/week: 0.0 standard drinks    Comment: occasional  . Drug use: No    Current Outpatient Medications:  .  atorvastatin (LIPITOR) 40 MG tablet, Take 40 mg by mouth daily at 6 PM., Disp: , Rfl:  .  Dulaglutide (TRULICITY) 1.5 0000000 SOPN, Inject 1.5 mg into the skin once a week. Mondays, Disp: , Rfl:  .  DULoxetine (CYMBALTA) 60 MG capsule, Take 60 mg by mouth at bedtime. , Disp: , Rfl:  .  gabapentin (NEURONTIN) 300 MG capsule, Take 600-900 mg by mouth See admin instructions. 600 mg in the morning and 900 mg at night, Disp: , Rfl:  .  insulin regular human CONCENTRATED (HUMULIN R U-500 KWIKPEN) 500  UNIT/ML kwikpen, Inject 35-45 Units into the skin See admin instructions. 45 units in the morning, 40 units at lunch, and 35 units at dinner  She is adjusting mealtime insulin to reflect that she is not eating much post-op, Disp: , Rfl:  .  losartan (COZAAR) 25 MG tablet, Take 25 mg by mouth every morning. , Disp: , Rfl:  .  metoprolol succinate (TOPROL-XL) 25 MG 24 hr tablet, Take 25 mg by mouth every morning. , Disp: , Rfl:  .  nabumetone (RELAFEN) 750 MG tablet, Take 1 tablet by mouth 2 (two) times a day., Disp: , Rfl:  .  omeprazole (PRILOSEC) 40 MG capsule, Take 40 mg by mouth daily. Pt may take additional dose if needed, Disp: , Rfl:    .  ondansetron (ZOFRAN) 4 MG tablet, Take 1 tablet (4 mg total) by mouth every 8 (eight) hours as needed for nausea or vomiting., Disp: 20 tablet, Rfl: 0 .  pioglitazone (ACTOS) 15 MG tablet, Take 15 mg by mouth daily., Disp: , Rfl:  .  rizatriptan (MAXALT) 10 MG tablet, Take 10 mg by mouth as needed for migraine. May repeat in 2 hours if needed, Disp: , Rfl:  .  UNIFINE PENTIPS 31G X 5 MM MISC, , Disp: , Rfl: 12 .  cyclobenzaprine (FLEXERIL) 10 MG tablet, Take 1 tablet (10 mg total) by mouth 3 (three) times daily as needed for muscle spasms. (Patient not taking: Reported on 08/31/2019), Disp: 30 tablet, Rfl: 0 .  docusate sodium (COLACE) 100 MG capsule, Take 1 capsule (100 mg total) by mouth 2 (two) times daily. (Patient not taking: Reported on 08/31/2019), Disp: 10 capsule, Rfl: 0 .  oxyCODONE 10 MG TABS, Take 1 tablet (10 mg total) by mouth every 4 (four) hours as needed for severe pain ((score 7 to 10)). (Patient not taking: Reported on 12/08/2018), Disp: 42 tablet, Rfl: 0 .  traMADol (ULTRAM) 50 MG tablet, Take 50-100 mg by mouth 2 (two) times daily as needed for moderate pain (depends on pain level if 50-100 mg). , Disp: , Rfl:  .  zolpidem (AMBIEN) 5 MG tablet, Take 5 mg by mouth at bedtime as needed for sleep. Reported on 08/30/2015, Disp: , Rfl:  Allergies: Biaxin [clarithromycin]  Review of Systems  Constitutional: Negative for chills, fever and malaise/fatigue.  HENT: Negative for congestion, sinus pain and sore throat.   Eyes: Negative for blurred vision and pain.  Respiratory: Negative for cough and wheezing.   Cardiovascular: Negative for chest pain and leg swelling.  Gastrointestinal: Negative for abdominal pain, constipation, diarrhea, heartburn, nausea and vomiting.  Genitourinary: Positive for urgency. Negative for dysuria, frequency and hematuria.  Musculoskeletal: Negative for back pain, joint pain, myalgias and neck pain.  Skin: Negative for itching and rash.  Neurological:  Negative for dizziness, tremors and weakness.  Endo/Heme/Allergies: Does not bruise/bleed easily.  Psychiatric/Behavioral: Negative for depression. The patient is not nervous/anxious and does not have insomnia.     Objective: BP 120/82   Ht 5\' 3"  (1.6 m)   Wt 245 lb (111.1 kg)   BMI 43.40 kg/m   Filed Weights   08/31/19 0812  Weight: 245 lb (111.1 kg)   Body mass index is 43.4 kg/m. Physical Exam Constitutional:      General: She is not in acute distress.    Appearance: She is well-developed.  Genitourinary:     Pelvic exam was performed with patient supine.     Vagina, uterus and rectum normal.  No lesions in the vagina.     No vaginal bleeding.     No cervical motion tenderness, friability, lesion or polyp.     Uterus is mobile.     Uterus is not enlarged.     No uterine mass detected.    Uterus is midaxial.     No right or left adnexal mass present.     Right adnexa not tender.     Left adnexa not tender.     Genitourinary Comments: Cervix flush w vaginal apex; prior LEEP effect; no lesions  HENT:     Head: Normocephalic and atraumatic. No laceration.     Right Ear: Hearing normal.     Left Ear: Hearing normal.     Mouth/Throat:     Pharynx: Uvula midline.  Eyes:     Pupils: Pupils are equal, round, and reactive to light.  Neck:     Thyroid: No thyromegaly.  Cardiovascular:     Rate and Rhythm: Normal rate and regular rhythm.     Heart sounds: No murmur. No friction rub. No gallop.   Pulmonary:     Effort: Pulmonary effort is normal. No respiratory distress.     Breath sounds: Normal breath sounds. No wheezing.  Chest:     Breasts:        Right: No mass, skin change or tenderness.        Left: No mass, skin change or tenderness.  Abdominal:     General: Bowel sounds are normal. There is no distension.     Palpations: Abdomen is soft.     Tenderness: There is no abdominal tenderness. There is no rebound.  Musculoskeletal:        General: Normal range of  motion.     Cervical back: Normal range of motion and neck supple.  Neurological:     Mental Status: She is alert and oriented to person, place, and time.     Cranial Nerves: No cranial nerve deficit.  Skin:    General: Skin is warm and dry.  Psychiatric:        Judgment: Judgment normal.  Vitals reviewed.     Assessment: Annual Exam 1. Women's annual routine gynecological examination   2. Screening for cervical cancer   3. Vaginal atrophy     Plan:            1.  Cervical Screening-  Pap smear done today  2. Breast screening- Exam annually and mammogram scheduled  3. Colonoscopy every 10 years, Hemoccult testing after age 44  4. Labs managed by PCP  5. Counseling for hormonal therapy: none              6. Vag Atrophy/ Dryness - Replens discussed, info given. Uses lubrication w sex and this helps.   7. OAB discussed, therapy options counseled if worsens in future    F/U  Return in about 1 year (around 08/30/2020) for Annual.  Barnett Applebaum, MD, [facility], [facility] 08/31/2019  8:51 AM

## 2019-09-01 LAB — CYTOLOGY - PAP
Adequacy: ABSENT
Diagnosis: NEGATIVE

## 2019-09-07 MED FILL — SHINGRIX 50 MCG SUS: 50 | 30 days supply | Qty: 1 | Fill #1

## 2019-09-15 MED FILL — TRULICITY 4.5 MG/0.5ML SOPN: 4.5 | 28 days supply | Qty: 2 | Fill #4

## 2019-09-24 MED FILL — TRULICITY 4.5 MG/0.5ML SOPN: 4.5 | 28 days supply | Qty: 2 | Fill #4

## 2019-11-13 ENCOUNTER — Other Ambulatory Visit (HOSPITAL_COMMUNITY): Payer: Self-pay | Admitting: Internal Medicine

## 2019-11-13 MED FILL — TRULICITY 3 MG/0.5ML SOPN: 3 | 84 days supply | Qty: 6 | Fill #0

## 2019-11-25 MED FILL — DEXCOM G6 RECEIVER DEVI: 30 days supply | Qty: 1 | Fill #0

## 2019-11-25 MED FILL — DEXCOM G6 TRANSMITTER MISC: 90 days supply | Qty: 1 | Fill #0

## 2019-11-25 MED FILL — TRULICITY 3 MG/0.5ML SOPN: 3 | 84 days supply | Qty: 6 | Fill #0

## 2019-11-25 MED FILL — DEXCOM G6 SENSOR MISC: 30 days supply | Qty: 3 | Fill #0

## 2020-02-12 ENCOUNTER — Other Ambulatory Visit (HOSPITAL_COMMUNITY): Payer: Self-pay | Admitting: Internal Medicine

## 2020-02-12 MED FILL — GABAPENTIN 300 MG CAPSULE: 300 | 90 days supply | Qty: 360 | Fill #0

## 2020-02-12 MED FILL — ATORVASTATIN 40 MG TABLET: 40 | 90 days supply | Qty: 90 | Fill #0

## 2020-02-12 MED FILL — TRULICITY 3 MG/0.5ML SOPN: 3 | 84 days supply | Qty: 6 | Fill #1

## 2020-02-12 MED FILL — LOSARTAN POTASSIUM 25 MG TA: 25 | 90 days supply | Qty: 90 | Fill #0

## 2020-02-12 MED FILL — NABUMETONE 750 MG TABS: 750 | 90 days supply | Qty: 180 | Fill #0

## 2020-02-12 MED FILL — OMEPRAZOLE 40 MG CPDR: 40 | 90 days supply | Qty: 90 | Fill #0

## 2020-02-12 MED FILL — METOPROLOL SUCCINATE ER 25: 25 | 90 days supply | Qty: 90 | Fill #0

## 2020-02-12 MED FILL — ZOLPIDEM TARTRATE 5 MG TAB: 5 | 30 days supply | Qty: 30 | Fill #0

## 2020-02-12 MED FILL — PIOGLITAZONE HCL 30 MG TAB: 30 | 90 days supply | Qty: 90 | Fill #0

## 2020-02-12 MED FILL — RIZATRIPTAN BENZOATE 10 MG: 10 | 30 days supply | Qty: 10 | Fill #0

## 2020-02-12 MED FILL — DEXCOM G6 SENSOR MISC: 30 days supply | Qty: 3 | Fill #1

## 2020-02-12 MED FILL — HUMULIN R 500 UNITS/ML KWIK: 500 | 25 days supply | Qty: 6 | Fill #5

## 2020-02-12 MED FILL — DULoxetine HCL 60 MG CPEP: 60 | 90 days supply | Qty: 45 | Fill #0

## 2020-02-18 MED FILL — FLUARIX QUADRIVALENT 0.5 ML: 0.5 | 1 days supply | Qty: 1 | Fill #0

## 2020-02-25 ENCOUNTER — Other Ambulatory Visit (HOSPITAL_COMMUNITY): Payer: Self-pay | Admitting: Internal Medicine

## 2020-03-15 MED FILL — HUMULIN R 500 UNITS/ML KWIK: 500 | 25 days supply | Qty: 6 | Fill #6

## 2020-03-18 MED FILL — DULoxetine HCL 60 MG CPEP: 60 | 90 days supply | Qty: 90 | Fill #0

## 2020-03-29 MED FILL — DEXCOM G6 SENSOR MISC: 30 days supply | Qty: 3 | Fill #2

## 2020-05-12 ENCOUNTER — Other Ambulatory Visit (HOSPITAL_COMMUNITY): Payer: Self-pay | Admitting: Internal Medicine

## 2020-05-12 MED FILL — DULoxetine HCL 60 MG CPEP: 60 | 90 days supply | Qty: 90 | Fill #0

## 2020-05-12 MED FILL — ZOLPIDEM TARTRATE 5 MG TAB: 5 | 30 days supply | Qty: 30 | Fill #1

## 2020-05-12 MED FILL — METOPROLOL SUCCINATE ER 25: 25 | 90 days supply | Qty: 90 | Fill #1

## 2020-05-12 MED FILL — OMEPRAZOLE 40 MG CPDR: 40 | 90 days supply | Qty: 90 | Fill #1

## 2020-05-12 MED FILL — GABAPENTIN 300 MG CAPSULE: 300 | 90 days supply | Qty: 360 | Fill #1

## 2020-05-12 MED FILL — LOSARTAN POTASSIUM 25 MG TA: 25 | 30 days supply | Qty: 30 | Fill #1

## 2020-05-12 MED FILL — HUMULIN R 500 UNITS/ML KWIK: 500 | 75 days supply | Qty: 18 | Fill #0

## 2020-05-12 MED FILL — TRULICITY 3 MG/0.5ML SOPN: 3 | 84 days supply | Qty: 6 | Fill #2

## 2020-05-12 MED FILL — DEXCOM G6 TRANSMITTER MISC: 90 days supply | Qty: 1 | Fill #1

## 2020-05-12 MED FILL — PIOGLITAZONE HCL 30 MG TAB: 30 | 90 days supply | Qty: 90 | Fill #1

## 2020-05-12 MED FILL — NABUMETONE 750 MG TABS: 750 | 90 days supply | Qty: 180 | Fill #1

## 2020-07-13 MED FILL — DEXCOM G6 SENSOR MISC: 30 days supply | Qty: 3 | Fill #3

## 2020-08-22 ENCOUNTER — Other Ambulatory Visit (HOSPITAL_COMMUNITY): Payer: Self-pay

## 2020-08-22 ENCOUNTER — Other Ambulatory Visit (HOSPITAL_COMMUNITY): Payer: Self-pay | Admitting: Internal Medicine

## 2020-08-22 MED FILL — Duloxetine HCl Enteric Coated Pellets Cap 60 MG (Base Eq): ORAL | 90 days supply | Qty: 90 | Fill #0 | Status: AC

## 2020-08-22 MED FILL — Continuous Glucose System Transmitter: 90 days supply | Qty: 1 | Fill #0 | Status: CN

## 2020-08-22 MED FILL — Dulaglutide Soln Auto-injector 3 MG/0.5ML: SUBCUTANEOUS | 84 days supply | Qty: 6 | Fill #0 | Status: AC

## 2020-08-22 MED FILL — Losartan Potassium Tab 25 MG: ORAL | 30 days supply | Qty: 30 | Fill #0 | Status: AC

## 2020-08-22 MED FILL — Continuous Glucose System Sensor: 30 days supply | Qty: 3 | Fill #0 | Status: CN

## 2020-08-22 MED FILL — Omeprazole Cap Delayed Release 40 MG: ORAL | 90 days supply | Qty: 90 | Fill #0 | Status: AC

## 2020-08-24 ENCOUNTER — Other Ambulatory Visit (HOSPITAL_COMMUNITY): Payer: Self-pay

## 2020-08-24 MED ORDER — METOPROLOL SUCCINATE ER 25 MG PO TB24
25.0000 mg | ORAL_TABLET | Freq: Every day | ORAL | 1 refills | Status: DC
  Filled 2020-08-24: qty 90, 90d supply, fill #0
  Filled 2020-11-22: qty 90, 90d supply, fill #1

## 2020-08-24 MED ORDER — NABUMETONE 750 MG PO TABS
750.0000 mg | ORAL_TABLET | Freq: Two times a day (BID) | ORAL | 1 refills | Status: DC
  Filled 2020-08-24: qty 180, 90d supply, fill #0
  Filled 2020-11-22: qty 180, 90d supply, fill #1

## 2020-08-24 MED ORDER — PIOGLITAZONE HCL 30 MG PO TABS
30.0000 mg | ORAL_TABLET | Freq: Every day | ORAL | 1 refills | Status: DC
  Filled 2020-08-24: qty 90, 90d supply, fill #0
  Filled 2020-11-22: qty 90, 90d supply, fill #1

## 2020-08-25 ENCOUNTER — Other Ambulatory Visit (HOSPITAL_COMMUNITY): Payer: Self-pay

## 2020-08-26 ENCOUNTER — Other Ambulatory Visit (HOSPITAL_COMMUNITY): Payer: Self-pay

## 2020-08-29 ENCOUNTER — Other Ambulatory Visit (HOSPITAL_COMMUNITY): Payer: Self-pay

## 2020-08-29 MED FILL — Insulin Regular (Human) Soln Pen-Injector 500 Unit/ML: SUBCUTANEOUS | 25 days supply | Qty: 6 | Fill #0 | Status: CN

## 2020-08-30 ENCOUNTER — Other Ambulatory Visit (HOSPITAL_COMMUNITY): Payer: Self-pay

## 2020-08-30 MED ORDER — HUMULIN R U-500 KWIKPEN 500 UNIT/ML ~~LOC~~ SOPN
PEN_INJECTOR | SUBCUTANEOUS | 3 refills | Status: DC
  Filled 2020-08-30: qty 18, 86d supply, fill #0
  Filled 2020-08-30: qty 24, 115d supply, fill #0
  Filled 2020-11-22: qty 18, 86d supply, fill #1
  Filled 2021-05-24: qty 18, 86d supply, fill #2
  Filled 2021-08-17: qty 18, 86d supply, fill #3

## 2020-08-30 MED ORDER — OMEPRAZOLE 40 MG PO CPDR
40.0000 mg | DELAYED_RELEASE_CAPSULE | Freq: Every day | ORAL | 3 refills | Status: DC
  Filled 2020-08-30: qty 180, 180d supply, fill #0

## 2020-08-30 MED ORDER — LOSARTAN POTASSIUM 25 MG PO TABS
25.0000 mg | ORAL_TABLET | Freq: Every day | ORAL | 3 refills | Status: DC
  Filled 2020-08-30 – 2020-11-22 (×2): qty 90, 90d supply, fill #0

## 2020-08-30 MED ORDER — GABAPENTIN 300 MG PO CAPS
600.0000 mg | ORAL_CAPSULE | Freq: Every evening | ORAL | 3 refills | Status: DC
  Filled 2020-08-30: qty 180, 90d supply, fill #0
  Filled 2020-11-22: qty 180, 90d supply, fill #1

## 2020-08-30 MED ORDER — TRULICITY 3 MG/0.5ML ~~LOC~~ SOAJ
3.0000 mg | SUBCUTANEOUS | 3 refills | Status: DC
  Filled 2020-08-30 – 2020-11-22 (×2): qty 6, 84d supply, fill #0
  Filled 2021-02-20: qty 6, 84d supply, fill #1
  Filled 2021-05-24 – 2021-05-29 (×2): qty 6, 84d supply, fill #2
  Filled 2021-08-17: qty 6, 84d supply, fill #3

## 2020-08-30 MED ORDER — ATORVASTATIN CALCIUM 40 MG PO TABS
40.0000 mg | ORAL_TABLET | Freq: Every day | ORAL | 3 refills | Status: DC
  Filled 2020-08-30: qty 90, 90d supply, fill #0
  Filled 2020-11-22: qty 90, 90d supply, fill #1

## 2020-08-30 MED ORDER — METOPROLOL SUCCINATE ER 25 MG PO TB24
1.0000 | ORAL_TABLET | Freq: Every day | ORAL | 3 refills | Status: DC
  Filled 2020-08-30: qty 90, 90d supply, fill #0

## 2020-08-30 MED ORDER — NABUMETONE 750 MG PO TABS
750.0000 mg | ORAL_TABLET | Freq: Two times a day (BID) | ORAL | 3 refills | Status: DC | PRN
  Filled 2020-08-30: qty 180, 90d supply, fill #0

## 2020-08-30 MED ORDER — ZOLPIDEM TARTRATE 5 MG PO TABS
5.0000 mg | ORAL_TABLET | Freq: Every evening | ORAL | 1 refills | Status: DC | PRN
  Filled 2020-08-30: qty 90, 90d supply, fill #0

## 2020-08-30 MED ORDER — PIOGLITAZONE HCL 30 MG PO TABS
30.0000 mg | ORAL_TABLET | Freq: Every day | ORAL | 3 refills | Status: DC
  Filled 2020-08-30 – 2021-04-10 (×2): qty 90, 90d supply, fill #0
  Filled 2021-07-02: qty 90, 90d supply, fill #1

## 2020-08-30 MED ORDER — DULOXETINE HCL 60 MG PO CPEP
60.0000 mg | ORAL_CAPSULE | Freq: Every day | ORAL | 3 refills | Status: DC
  Filled 2020-08-30 – 2020-11-22 (×2): qty 90, 90d supply, fill #0

## 2020-09-13 ENCOUNTER — Other Ambulatory Visit (HOSPITAL_COMMUNITY): Payer: Self-pay

## 2020-09-13 MED FILL — Continuous Glucose System Transmitter: 90 days supply | Qty: 1 | Fill #0 | Status: AC

## 2020-09-13 MED FILL — Continuous Glucose System Sensor: 30 days supply | Qty: 3 | Fill #0 | Status: AC

## 2020-10-07 ENCOUNTER — Encounter: Payer: Self-pay | Admitting: Obstetrics & Gynecology

## 2020-10-07 ENCOUNTER — Ambulatory Visit (INDEPENDENT_AMBULATORY_CARE_PROVIDER_SITE_OTHER): Payer: No Typology Code available for payment source | Admitting: Obstetrics & Gynecology

## 2020-10-07 ENCOUNTER — Other Ambulatory Visit: Payer: Self-pay

## 2020-10-07 ENCOUNTER — Other Ambulatory Visit (HOSPITAL_COMMUNITY)
Admission: RE | Admit: 2020-10-07 | Discharge: 2020-10-07 | Disposition: A | Discharge status: Home / Self Care | Payer: No Typology Code available for payment source | Source: Ambulatory Visit | Attending: Obstetrics & Gynecology | Admitting: Obstetrics & Gynecology

## 2020-10-07 VITALS — BP 138/80 | Ht 63.0 in | Wt 253.0 lb

## 2020-10-07 DIAGNOSIS — Z01419 Encounter for gynecological examination (general) (routine) without abnormal findings: Secondary | ICD-10-CM

## 2020-10-07 DIAGNOSIS — Z1211 Encounter for screening for malignant neoplasm of colon: Secondary | ICD-10-CM

## 2020-10-07 DIAGNOSIS — Z1231 Encounter for screening mammogram for malignant neoplasm of breast: Secondary | ICD-10-CM

## 2020-10-07 DIAGNOSIS — Z124 Encounter for screening for malignant neoplasm of cervix: Secondary | ICD-10-CM | POA: Insufficient documentation

## 2020-10-07 NOTE — Progress Notes (Signed)
HPI:      [name] is a 60 y.o. G1P1001 who LMP was in the past, she presents today for her annual examination.  The patient has no complaints today. The patient is sexually active. Herlast pap: approximate date 2021 and was normal and last mammogram: approximate date 2020 and was normal.  Prior LEEP 2015. The patient does perform self breast exams.  There is no notable family history of breast or ovarian cancer in her family. The patient is not taking hormone replacement therapy. Patient denies post-menopausal vaginal bleeding.   The patient has regular exercise: yes. The patient denies current symptoms of depression.  Denies sx's of OAB or vag dryness this year.  GYN Hx: Last Colonoscopy:8 years ago. Normal.  Last DEXA: never ago.    PMHx: Past Medical History:  Diagnosis Date  . Arthritis   . Asthma without status asthmaticus 08/02/2015   Overview:  mild, not requiring treatment   . Cervical spinal stenosis   . Degeneration of intervertebral disc of cervical region 04/28/2015  . Depression   . Diabetes ([place])   . GERD (gastroesophageal reflux disease)   . Headache, migraine 08/02/2015  . Heartburn   . History of kidney stones   . HLD (hyperlipidemia)   . Kidney stones 07/18/2015  . Lumbar stenosis   . Pancreatitis   . Radiculopathy of cervical region   . Sleep apnea    does not wear cpap    Past Surgical History:  Procedure Laterality Date  . ANTERIOR CERVICAL DECOMP/DISCECTOMY FUSION N/A 04/20/2016   Procedure: CERVICAL THREE-FOUR  ANTERIOR CERVICAL DECOMPRESSION/DISCECTOMY/FUSION WITH PARTIAL REMOVAL OF PLATE AT CERVICAL FOUR;  Surgeon: Leeroy Cha, MD;  Location: [facility];  Service: Neurosurgery;  Laterality: N/A;  . [place]  . CERVICAL BIOPSY  W/ LOOP ELECTRODE EXCISION  08/12/2013  . CERVICAL FUSION  2009  . COLONOSCOPY    . COLPOSCOPY  03/26/2013  . CYSTOSCOPY WITH STENT PLACEMENT Right 08/24/2015   Procedure: CYSTOSCOPY WITH STENT PLACEMENT;   Surgeon: Cleon Gustin, MD;  Location: [facility];  Service: Urology;  Laterality: Right;  . NEPHROLITHOTOMY Right 08/24/2015   Procedure: NEPHROLITHOTOMY PERCUTANEOUS WITH SURGEON TO GET ACCESS;  Surgeon: Cleon Gustin, MD;  Location: [facility];  Service: Urology;  Laterality: Right;  . TUBAL LIGATION     Family History  Problem Relation Age of Onset  . Heart failure Mother   . Heart attack Father   . Colon cancer Maternal Grandfather 45  . Kidney disease Neg Hx   . Bladder Cancer Neg Hx   . Breast cancer Neg Hx    Social History   Tobacco Use  . Smoking status: Never Smoker  . Smokeless tobacco: Never Used  Vaping Use  . Vaping Use: Never used  Substance Use Topics  . Alcohol use: Yes    Alcohol/week: 0.0 standard drinks    Comment: occasional  . Drug use: No    Current Outpatient Medications:  .  atorvastatin (LIPITOR) 40 MG tablet, Take 40 mg by mouth daily at 6 PM., Disp: , Rfl:  .  atorvastatin (LIPITOR) 40 MG tablet, Take 1 tablet (40 mg total) by mouth daily., Disp: 90 tablet, Rfl: 3 .  Continuous Blood Gluc Sensor (DEXCOM G6 SENSOR) MISC, USE TO MONITOR BLOOD SUGAR. REPLACE EVERY 10 DAYS., Disp: 3 each, Rfl: 12 .  Continuous Blood Gluc Transmit (DEXCOM G6 TRANSMITTER) MISC, USE TO MONITOR BLOOD SUGAR. REPLACE EVERY 3 MONTHS., Disp: 1  each, Rfl: 4 .  cyclobenzaprine (FLEXERIL) 10 MG tablet, Take 1 tablet (10 mg total) by mouth 3 (three) times daily as needed for muscle spasms., Disp: 30 tablet, Rfl: 0 .  docusate sodium (COLACE) 100 MG capsule, Take 1 capsule (100 mg total) by mouth 2 (two) times daily., Disp: 10 capsule, Rfl: 0 .  Dulaglutide (TRULICITY) 1.5 JX/9.1YN SOPN, Inject 1.5 mg into the skin once a week. Mondays, Disp: , Rfl:  .  Dulaglutide (TRULICITY) 3 WG/9.5AO SOPN, Inject 3 mg into the skin every 7 (seven) days., Disp: 6 mL, Rfl: 3 .  DULoxetine (CYMBALTA) 60 MG capsule, Take 60 mg by mouth at bedtime. , Disp: , Rfl:  .  DULoxetine (CYMBALTA) 60 MG  capsule, Take 1 capsule (60 mg total) by mouth daily., Disp: 90 capsule, Rfl: 3 .  gabapentin (NEURONTIN) 300 MG capsule, Take 600-900 mg by mouth See admin instructions. 600 mg in the morning and 900 mg at night, Disp: , Rfl:  .  gabapentin (NEURONTIN) 300 MG capsule, Take 2 capsules (600 mg total) by mouth nightly, Disp: 180 capsule, Rfl: 3 .  insulin regular human CONCENTRATED (HUMULIN R U-500 KWIKPEN) 500 UNIT/ML kwikpen, Inject 35-45 Units into the skin See admin instructions. 45 units in the morning, 40 units at lunch, and 35 units at dinner  She is adjusting mealtime insulin to reflect that she is not eating much post-op, Disp: , Rfl:  .  insulin regular human CONCENTRATED (HUMULIN R U-500 KWIKPEN) 500 UNIT/ML kwikpen, Inject 40 Units into the skin every morning before breakfast AND 35 Units daily with lunch AND 30 Units daily before supper., Disp: 24 mL, Rfl: 3 .  insulin regular human CONCENTRATED (HUMULIN R) 500 UNIT/ML kwikpen, INJECT 45 UNITS UNDER THE SKIN BEFORE BREAKFAST AND 40 UNITS AT LUNCH AND 35 UNITS BEFORE SUPPER, Disp: 6 mL, Rfl: 6 .  losartan (COZAAR) 25 MG tablet, Take 25 mg by mouth every morning. , Disp: , Rfl:  .  losartan (COZAAR) 25 MG tablet, TAKE 1 TABLET BY MOUTH ONCE DAILY., Disp: 90 tablet, Rfl: 3 .  losartan (COZAAR) 25 MG tablet, Take 1 tablet (25 mg total) by mouth once daily, Disp: 90 tablet, Rfl: 3 .  metoprolol succinate (TOPROL-XL) 25 MG 24 hr tablet, Take 25 mg by mouth every morning. , Disp: , Rfl:  .  metoprolol succinate (TOPROL-XL) 25 MG 24 hr tablet, TAKE 1 TABLET BY MOUTH ONCE DAILY., Disp: 90 tablet, Rfl: 1 .  metoprolol succinate (TOPROL-XL) 25 MG 24 hr tablet, Take 1 tablet (25 mg total) by mouth once daily, Disp: 90 tablet, Rfl: 3 .  nabumetone (RELAFEN) 750 MG tablet, Take 1 tablet by mouth 2 (two) times a day., Disp: , Rfl:  .  nabumetone (RELAFEN) 750 MG tablet, TAKE 1 TABLET BY MOUTH TWICE DAILY., Disp: 180 tablet, Rfl: 1 .  nabumetone (RELAFEN)  750 MG tablet, Take 1 tablet (750 mg total) by mouth 2 (two) times daily as needed for Pain, Disp: 180 tablet, Rfl: 3 .  omeprazole (PRILOSEC) 40 MG capsule, Take 40 mg by mouth daily. Pt may take additional dose if needed, Disp: , Rfl:  .  omeprazole (PRILOSEC) 40 MG capsule, TAKE 1 CAPSULE (40 MG TOTAL) BY MOUTH ONCE DAILY, Disp: 180 capsule, Rfl: 1 .  omeprazole (PRILOSEC) 40 MG capsule, Take 1 capsule (40 mg total) by mouth daily., Disp: 180 capsule, Rfl: 3 .  ondansetron (ZOFRAN) 4 MG tablet, Take 1 tablet (4 mg total) by mouth  every 8 (eight) hours as needed for nausea or vomiting., Disp: 20 tablet, Rfl: 0 .  oxyCODONE 10 MG TABS, Take 1 tablet (10 mg total) by mouth every 4 (four) hours as needed for severe pain ((score 7 to 10))., Disp: 42 tablet, Rfl: 0 .  pioglitazone (ACTOS) 15 MG tablet, Take 15 mg by mouth daily., Disp: , Rfl:  .  pioglitazone (ACTOS) 30 MG tablet, TAKE 1 TABLET BY MOUTH ONCE DAILY., Disp: 90 tablet, Rfl: 1 .  pioglitazone (ACTOS) 30 MG tablet, Take 1 tablet (30 mg total) by mouth once daily, Disp: 90 tablet, Rfl: 3 .  rizatriptan (MAXALT) 10 MG tablet, Take 10 mg by mouth as needed for migraine. May repeat in 2 hours if needed, Disp: , Rfl:  .  traMADol (ULTRAM) 50 MG tablet, Take 50-100 mg by mouth 2 (two) times daily as needed for moderate pain (depends on pain level if 50-100 mg). , Disp: , Rfl:  .  UNIFINE PENTIPS 31G X 5 MM MISC, , Disp: , Rfl: 12 .  zolpidem (AMBIEN) 5 MG tablet, Take 5 mg by mouth at bedtime as needed for sleep. Reported on 08/30/2015, Disp: , Rfl:  .  zolpidem (AMBIEN) 5 MG tablet, Take 1 tablet (5 mg total) by mouth nightly as needed for Sleep, Disp: 90 tablet, Rfl: 1 Allergies: Clarithromycin  Review of Systems  Constitutional: Negative for chills, fever and malaise/fatigue.  HENT: Negative for congestion, sinus pain and sore throat.   Eyes: Negative for blurred vision and pain.  Respiratory: Negative for cough and wheezing.    Cardiovascular: Negative for chest pain and leg swelling.  Gastrointestinal: Negative for abdominal pain, constipation, diarrhea, heartburn, nausea and vomiting.  Genitourinary: Negative for dysuria, frequency, hematuria and urgency.  Musculoskeletal: Negative for back pain, joint pain, myalgias and neck pain.  Skin: Negative for itching and rash.  Neurological: Negative for dizziness, tremors and weakness.  Endo/Heme/Allergies: Does not bruise/bleed easily.  Psychiatric/Behavioral: Negative for depression. The patient is not nervous/anxious and does not have insomnia.   All other systems reviewed and are negative.   Objective: BP 138/80   Ht 5\' 3"  (1.6 m)   Wt 253 lb (114.8 kg)   BMI 44.82 kg/m   Filed Weights   10/07/20 0819  Weight: 253 lb (114.8 kg)   Body mass index is 44.82 kg/m. Physical Exam Constitutional:      General: She is not in acute distress.    Appearance: She is well-developed.  Genitourinary:     Rectum normal.     No lesions in the vagina.     No vaginal bleeding.      Right Adnexa: not tender and no mass present.    Left Adnexa: not tender and no mass present.    No cervical motion tenderness, friability, lesion or polyp.     Cervical exam comments: Flush w apex, no lesions.     Uterus is not enlarged.     No uterine mass detected.    Uterus is midaxial.     Pelvic exam was performed with patient in the lithotomy position.  Breasts:     Right: No mass, skin change or tenderness.     Left: No mass, skin change or tenderness.    HENT:     Head: Normocephalic and atraumatic. No laceration.     Right Ear: Hearing normal.     Left Ear: Hearing normal.     Mouth/Throat:     Pharynx: Uvula midline.  Eyes:     Pupils: Pupils are equal, round, and reactive to light.  Neck:     Thyroid: No thyromegaly.  Cardiovascular:     Rate and Rhythm: Normal rate and regular rhythm.     Heart sounds: No murmur heard. No friction rub. No gallop.   Pulmonary:      Effort: Pulmonary effort is normal. No respiratory distress.     Breath sounds: Normal breath sounds. No wheezing.  Abdominal:     General: Bowel sounds are normal. There is no distension.     Palpations: Abdomen is soft.     Tenderness: There is no abdominal tenderness. There is no rebound.  Musculoskeletal:        General: Normal range of motion.     Cervical back: Normal range of motion and neck supple.  Neurological:     Mental Status: She is alert and oriented to person, place, and time.     Cranial Nerves: No cranial nerve deficit.  Skin:    General: Skin is warm and dry.  Psychiatric:        Judgment: Judgment normal.  Vitals reviewed.     Assessment: Annual Exam 1. Women's annual routine gynecological examination   2. Screening for cervical cancer   3. Encounter for screening mammogram for malignant neoplasm of breast   4. Screen for colon cancer     Plan:            1.  Cervical Screening-  Pap smear done today  2. Breast screening- Exam annually and mammogram scheduled  3. Colonoscopy every 10 years, Hemoccult testing after age 15  4. Labs managed by PCP  5. Counseling for hormonal therapy: none              6. FRAX - FRAX score for assessing the 10 year probability for fracture calculated and discussed today.  Based on age and score today, DEXA is not currently scheduled.    F/U  Return in about 1 year (around 10/07/2021) for Annual.  Barnett Applebaum, MD, [facility], [facility] 10/07/2020  8:43 AM

## 2020-10-07 NOTE — Patient Instructions (Signed)
PAP every year Mammogram every year    Call [phone] to schedule at [facility] Colonoscopy every 10 years Labs yearly (with PCP)  Thank you for choosing [facility]. As part of our ongoing efforts to improve patient experience, we would appreciate your feedback. Please fill out the short survey that you will receive by mail or MyChart. Your opinion is important to us! - Dr. Jannis Atkins  

## 2020-10-11 LAB — CYTOLOGY - PAP
Adequacy: ABSENT
Comment: NEGATIVE
Diagnosis: NEGATIVE
High risk HPV: NEGATIVE

## 2020-10-28 ENCOUNTER — Ambulatory Visit
Admission: RE | Admit: 2020-10-28 | Discharge: 2020-10-28 | Disposition: A | Discharge status: Home / Self Care | Payer: No Typology Code available for payment source | Source: Ambulatory Visit | Attending: Obstetrics & Gynecology | Admitting: Obstetrics & Gynecology

## 2020-10-28 ENCOUNTER — Other Ambulatory Visit: Payer: Self-pay

## 2020-10-28 DIAGNOSIS — Z1231 Encounter for screening mammogram for malignant neoplasm of breast: Secondary | ICD-10-CM | POA: Diagnosis not present

## 2020-11-22 ENCOUNTER — Other Ambulatory Visit (HOSPITAL_COMMUNITY): Payer: Self-pay

## 2020-11-22 MED FILL — Omeprazole Cap Delayed Release 40 MG: ORAL | 90 days supply | Qty: 90 | Fill #1 | Status: AC

## 2020-11-23 ENCOUNTER — Other Ambulatory Visit (HOSPITAL_COMMUNITY): Payer: Self-pay

## 2020-11-24 ENCOUNTER — Other Ambulatory Visit (HOSPITAL_COMMUNITY): Payer: Self-pay

## 2020-11-24 MED ORDER — OMEPRAZOLE 40 MG PO CPDR
40.0000 mg | DELAYED_RELEASE_CAPSULE | Freq: Every day | ORAL | 3 refills | Status: DC
  Filled 2020-11-24: qty 90, 90d supply, fill #0

## 2020-11-24 MED ORDER — DEXCOM G6 SENSOR MISC
12 refills | Status: DC
  Filled 2020-11-24 – 2020-12-01 (×3): qty 3, 30d supply, fill #0
  Filled 2021-01-02: qty 3, 30d supply, fill #1
  Filled 2021-02-20: qty 3, 30d supply, fill #2
  Filled 2021-04-10: qty 3, 30d supply, fill #3
  Filled 2021-05-24: qty 3, 30d supply, fill #4
  Filled 2021-07-03: qty 3, 30d supply, fill #5
  Filled 2021-08-17: qty 3, 30d supply, fill #6
  Filled 2021-11-12: qty 3, 30d supply, fill #7

## 2020-11-28 ENCOUNTER — Other Ambulatory Visit (HOSPITAL_COMMUNITY): Payer: Self-pay

## 2020-11-30 ENCOUNTER — Other Ambulatory Visit (HOSPITAL_COMMUNITY): Payer: Self-pay

## 2020-12-01 ENCOUNTER — Other Ambulatory Visit (HOSPITAL_COMMUNITY): Payer: Self-pay

## 2020-12-02 ENCOUNTER — Other Ambulatory Visit (HOSPITAL_COMMUNITY): Payer: Self-pay

## 2021-01-02 ENCOUNTER — Other Ambulatory Visit (HOSPITAL_COMMUNITY): Payer: Self-pay

## 2021-01-03 ENCOUNTER — Other Ambulatory Visit (HOSPITAL_COMMUNITY): Payer: Self-pay

## 2021-01-03 MED ORDER — DEXCOM G6 TRANSMITTER MISC
4 refills | Status: DC
  Filled 2021-01-03: qty 1, 90d supply, fill #0
  Filled 2021-04-24: qty 1, 90d supply, fill #1
  Filled 2021-08-17: qty 1, 90d supply, fill #2

## 2021-01-13 ENCOUNTER — Other Ambulatory Visit (HOSPITAL_COMMUNITY): Payer: Self-pay

## 2021-01-13 MED ORDER — HYDROCODONE-ACETAMINOPHEN 5-325 MG PO TABS
1.0000 | ORAL_TABLET | ORAL | 0 refills | Status: DC | PRN
  Filled 2021-01-13: qty 30, 5d supply, fill #0

## 2021-01-20 ENCOUNTER — Other Ambulatory Visit: Payer: Self-pay | Admitting: Neurosurgery

## 2021-01-20 DIAGNOSIS — G8929 Other chronic pain: Secondary | ICD-10-CM

## 2021-01-27 ENCOUNTER — Ambulatory Visit
Admission: RE | Admit: 2021-01-27 | Discharge: 2021-01-27 | Disposition: A | Discharge status: Home / Self Care | Payer: No Typology Code available for payment source | Source: Ambulatory Visit | Attending: Neurosurgery | Admitting: Neurosurgery

## 2021-01-27 ENCOUNTER — Other Ambulatory Visit: Payer: Self-pay | Admitting: Neurosurgery

## 2021-01-27 DIAGNOSIS — G8929 Other chronic pain: Secondary | ICD-10-CM

## 2021-01-27 DIAGNOSIS — M5441 Lumbago with sciatica, right side: Secondary | ICD-10-CM | POA: Diagnosis not present

## 2021-01-27 MED ORDER — GADOBUTROL 1 MMOL/ML IV SOLN: 10.0000 mL | Freq: Once | INTRAVENOUS | Status: DC | PRN

## 2021-02-08 ENCOUNTER — Other Ambulatory Visit: Payer: Self-pay | Admitting: Neurosurgery

## 2021-02-08 DIAGNOSIS — M5441 Lumbago with sciatica, right side: Secondary | ICD-10-CM

## 2021-02-08 DIAGNOSIS — G8929 Other chronic pain: Secondary | ICD-10-CM

## 2021-02-20 ENCOUNTER — Other Ambulatory Visit (HOSPITAL_COMMUNITY): Payer: Self-pay

## 2021-03-02 ENCOUNTER — Ambulatory Visit
Admission: RE | Admit: 2021-03-02 | Discharge: 2021-03-02 | Disposition: A | Discharge status: Home / Self Care | Payer: No Typology Code available for payment source | Source: Ambulatory Visit | Attending: Neurosurgery | Admitting: Neurosurgery

## 2021-03-02 DIAGNOSIS — G8929 Other chronic pain: Secondary | ICD-10-CM

## 2021-03-02 MED ORDER — ONDANSETRON HCL 4 MG/2ML IJ SOLN: 4.0000 mg | Freq: Once | INTRAMUSCULAR | Status: DC | PRN

## 2021-03-02 MED ORDER — DIAZEPAM 5 MG PO TABS: 5.0000 mg | ORAL_TABLET | Freq: Once | ORAL | Status: DC

## 2021-03-02 MED ORDER — DIAZEPAM 5 MG PO TABS
10.0000 mg | ORAL_TABLET | Freq: Once | ORAL | Status: AC
  Administered 2021-03-02: 10 mg via ORAL

## 2021-03-02 MED ORDER — IOPAMIDOL (ISOVUE-M 200) INJECTION 41%: 20.0000 mL | Freq: Once | INTRAMUSCULAR | Status: DC

## 2021-03-02 MED ORDER — MEPERIDINE HCL 50 MG/ML IJ SOLN: 50.0000 mg | Freq: Once | INTRAMUSCULAR | Status: DC | PRN

## 2021-03-02 NOTE — Discharge Instructions (Signed)

## 2021-03-03 ENCOUNTER — Other Ambulatory Visit (HOSPITAL_COMMUNITY): Payer: Self-pay

## 2021-03-03 MED ORDER — OMEPRAZOLE 40 MG PO CPDR
40.0000 mg | DELAYED_RELEASE_CAPSULE | Freq: Two times a day (BID) | ORAL | 3 refills | Status: DC
  Filled 2021-03-03: qty 180, 90d supply, fill #0

## 2021-03-13 ENCOUNTER — Other Ambulatory Visit (HOSPITAL_COMMUNITY): Payer: Self-pay

## 2021-03-29 ENCOUNTER — Other Ambulatory Visit (HOSPITAL_COMMUNITY): Payer: Self-pay

## 2021-03-29 ENCOUNTER — Other Ambulatory Visit: Payer: Self-pay

## 2021-03-29 MED ORDER — ATORVASTATIN CALCIUM 40 MG PO TABS
40.0000 mg | ORAL_TABLET | Freq: Every day | ORAL | 3 refills | Status: DC
  Filled 2021-03-29: qty 90, 90d supply, fill #0
  Filled 2021-07-02: qty 90, 90d supply, fill #1
  Filled 2021-10-02: qty 90, 90d supply, fill #2
  Filled 2022-01-24: qty 90, 90d supply, fill #3

## 2021-03-29 MED ORDER — DULOXETINE HCL 60 MG PO CPEP
60.0000 mg | ORAL_CAPSULE | Freq: Every day | ORAL | 3 refills | Status: DC
  Filled 2021-03-29: qty 90, 90d supply, fill #0
  Filled 2021-07-02: qty 90, 90d supply, fill #1
  Filled 2021-10-02: qty 90, 90d supply, fill #2
  Filled 2022-01-24: qty 90, 90d supply, fill #3

## 2021-03-29 MED ORDER — METOPROLOL SUCCINATE ER 25 MG PO TB24
25.0000 mg | ORAL_TABLET | Freq: Every day | ORAL | 3 refills | Status: DC
  Filled 2021-03-29: qty 90, 90d supply, fill #0
  Filled 2021-07-02: qty 90, 90d supply, fill #1
  Filled 2021-10-02: qty 90, 90d supply, fill #2
  Filled 2022-01-24: qty 90, 90d supply, fill #3

## 2021-03-29 MED ORDER — NABUMETONE 750 MG PO TABS
750.0000 mg | ORAL_TABLET | Freq: Two times a day (BID) | ORAL | 3 refills | Status: DC | PRN
  Filled 2021-03-29: qty 180, 90d supply, fill #0
  Filled 2021-07-02: qty 180, 90d supply, fill #1
  Filled 2021-10-02: qty 180, 90d supply, fill #2
  Filled 2022-01-24: qty 180, 90d supply, fill #3

## 2021-03-29 MED ORDER — GABAPENTIN 300 MG PO CAPS
600.0000 mg | ORAL_CAPSULE | Freq: Every day | ORAL | 3 refills | Status: DC
  Filled 2021-03-29: qty 180, 90d supply, fill #0
  Filled 2021-07-02: qty 180, 90d supply, fill #1
  Filled 2021-10-02: qty 180, 90d supply, fill #2

## 2021-03-29 MED ORDER — LOSARTAN POTASSIUM 25 MG PO TABS
25.0000 mg | ORAL_TABLET | Freq: Every day | ORAL | 3 refills | Status: DC
  Filled 2021-03-29: qty 90, 90d supply, fill #0
  Filled 2021-07-02: qty 90, 90d supply, fill #1
  Filled 2021-10-02: qty 90, 90d supply, fill #2
  Filled 2022-01-24: qty 90, 90d supply, fill #3

## 2021-03-29 MED ORDER — OMEPRAZOLE 40 MG PO CPDR
40.0000 mg | DELAYED_RELEASE_CAPSULE | Freq: Two times a day (BID) | ORAL | 3 refills | Status: DC
  Filled 2021-03-29: qty 180, 90d supply, fill #0
  Filled 2021-07-02: qty 180, 90d supply, fill #1
  Filled 2021-10-02: qty 180, 90d supply, fill #2
  Filled 2022-01-24: qty 180, 90d supply, fill #3

## 2021-04-06 ENCOUNTER — Other Ambulatory Visit (HOSPITAL_COMMUNITY): Payer: Self-pay

## 2021-04-10 ENCOUNTER — Other Ambulatory Visit (HOSPITAL_COMMUNITY): Payer: Self-pay

## 2021-04-11 ENCOUNTER — Other Ambulatory Visit (HOSPITAL_COMMUNITY): Payer: Self-pay

## 2021-04-24 ENCOUNTER — Other Ambulatory Visit (HOSPITAL_COMMUNITY): Payer: Self-pay

## 2021-05-24 ENCOUNTER — Other Ambulatory Visit (HOSPITAL_COMMUNITY): Payer: Self-pay

## 2021-05-25 ENCOUNTER — Other Ambulatory Visit (HOSPITAL_COMMUNITY): Payer: Self-pay

## 2021-05-26 ENCOUNTER — Other Ambulatory Visit (HOSPITAL_COMMUNITY): Payer: Self-pay

## 2021-05-29 ENCOUNTER — Other Ambulatory Visit (HOSPITAL_COMMUNITY): Payer: Self-pay

## 2021-05-30 ENCOUNTER — Other Ambulatory Visit: Payer: Self-pay

## 2021-07-03 ENCOUNTER — Other Ambulatory Visit (HOSPITAL_COMMUNITY): Payer: Self-pay

## 2021-07-04 ENCOUNTER — Other Ambulatory Visit (HOSPITAL_COMMUNITY): Payer: Self-pay

## 2021-08-18 ENCOUNTER — Other Ambulatory Visit (HOSPITAL_COMMUNITY): Payer: Self-pay

## 2021-08-24 ENCOUNTER — Other Ambulatory Visit (HOSPITAL_COMMUNITY): Payer: Self-pay

## 2021-08-29 ENCOUNTER — Other Ambulatory Visit (HOSPITAL_COMMUNITY): Payer: Self-pay

## 2021-09-06 ENCOUNTER — Other Ambulatory Visit (HOSPITAL_COMMUNITY): Payer: Self-pay

## 2021-09-06 MED ORDER — HUMULIN R U-500 KWIKPEN 500 UNIT/ML ~~LOC~~ SOPN
PEN_INJECTOR | SUBCUTANEOUS | 3 refills | Status: AC
  Filled 2021-09-06 – 2021-11-12 (×2): qty 18, 86d supply, fill #0

## 2021-09-06 MED ORDER — PIOGLITAZONE HCL 30 MG PO TABS
30.0000 mg | ORAL_TABLET | Freq: Every day | ORAL | 3 refills | Status: DC
  Filled 2021-09-06 – 2021-10-02 (×2): qty 90, 90d supply, fill #0
  Filled 2022-01-24: qty 90, 90d supply, fill #1
  Filled 2022-04-22: qty 90, 90d supply, fill #2
  Filled 2022-08-27: qty 90, 90d supply, fill #3

## 2021-09-06 MED ORDER — TRULICITY 3 MG/0.5ML ~~LOC~~ SOAJ
3.0000 mg | SUBCUTANEOUS | 3 refills | Status: DC
  Filled 2021-09-06 – 2021-11-02 (×2): qty 6, 84d supply, fill #0
  Filled 2022-01-24: qty 6, 84d supply, fill #1
  Filled 2022-04-22: qty 6, 84d supply, fill #2
  Filled 2022-07-17 – 2022-07-18 (×3): qty 2, 28d supply, fill #3
  Filled 2022-08-27 – 2022-08-31 (×2): qty 2, 28d supply, fill #4

## 2021-10-02 ENCOUNTER — Other Ambulatory Visit (HOSPITAL_COMMUNITY): Payer: Self-pay

## 2021-10-13 ENCOUNTER — Other Ambulatory Visit: Payer: Self-pay | Admitting: Internal Medicine

## 2021-10-13 DIAGNOSIS — Z1231 Encounter for screening mammogram for malignant neoplasm of breast: Secondary | ICD-10-CM

## 2021-11-02 ENCOUNTER — Other Ambulatory Visit (HOSPITAL_COMMUNITY): Payer: Self-pay

## 2021-11-13 ENCOUNTER — Other Ambulatory Visit (HOSPITAL_COMMUNITY): Payer: Self-pay

## 2022-01-15 ENCOUNTER — Other Ambulatory Visit (HOSPITAL_COMMUNITY): Payer: Self-pay

## 2022-01-15 MED ORDER — DEXCOM G6 TRANSMITTER MISC
3 refills | Status: DC
  Filled 2022-01-15 – 2022-01-25 (×2): qty 1, 90d supply, fill #0
  Filled 2022-05-25: qty 1, 90d supply, fill #1
  Filled 2022-09-24: qty 1, 90d supply, fill #2

## 2022-01-15 MED ORDER — DEXCOM G6 SENSOR MISC
11 refills | Status: DC
  Filled 2022-01-15 – 2022-01-25 (×2): qty 3, 30d supply, fill #0
  Filled 2022-03-29: qty 3, 30d supply, fill #1
  Filled 2022-04-22: qty 3, 30d supply, fill #2
  Filled 2022-05-25: qty 3, 30d supply, fill #3
  Filled 2022-07-17 – 2022-07-18 (×2): qty 3, 30d supply, fill #4
  Filled 2022-08-27: qty 3, 30d supply, fill #5
  Filled 2022-09-24: qty 3, 30d supply, fill #6
  Filled 2022-11-26: qty 3, 30d supply, fill #7

## 2022-01-24 ENCOUNTER — Other Ambulatory Visit (HOSPITAL_COMMUNITY): Payer: Self-pay

## 2022-01-25 ENCOUNTER — Other Ambulatory Visit (HOSPITAL_COMMUNITY): Payer: Self-pay

## 2022-03-08 ENCOUNTER — Other Ambulatory Visit (HOSPITAL_COMMUNITY): Payer: Self-pay

## 2022-03-08 MED ORDER — MYRBETRIQ 25 MG PO TB24
25.0000 mg | ORAL_TABLET | Freq: Every day | ORAL | 11 refills | Status: DC
  Filled 2022-03-08 – 2022-03-29 (×3): qty 30, 30d supply, fill #0

## 2022-03-09 ENCOUNTER — Other Ambulatory Visit: Payer: Self-pay | Admitting: Internal Medicine

## 2022-03-09 DIAGNOSIS — E1165 Type 2 diabetes mellitus with hyperglycemia: Secondary | ICD-10-CM

## 2022-03-09 DIAGNOSIS — R0609 Other forms of dyspnea: Secondary | ICD-10-CM

## 2022-03-16 ENCOUNTER — Other Ambulatory Visit (HOSPITAL_COMMUNITY): Payer: Self-pay

## 2022-03-20 ENCOUNTER — Other Ambulatory Visit (HOSPITAL_COMMUNITY): Payer: Self-pay

## 2022-03-22 ENCOUNTER — Other Ambulatory Visit (HOSPITAL_COMMUNITY): Payer: Self-pay

## 2022-03-22 MED ORDER — ONDANSETRON HCL 4 MG PO TABS
4.0000 mg | ORAL_TABLET | Freq: Two times a day (BID) | ORAL | 1 refills | Status: AC | PRN
  Filled 2022-03-22: qty 30, 15d supply, fill #0

## 2022-03-29 ENCOUNTER — Other Ambulatory Visit (HOSPITAL_COMMUNITY): Payer: Self-pay

## 2022-03-29 MED ORDER — TOLTERODINE TARTRATE ER 4 MG PO CP24
4.0000 mg | ORAL_CAPSULE | Freq: Every day | ORAL | 11 refills | Status: DC
  Filled 2022-03-29: qty 30, 30d supply, fill #0
  Filled 2022-04-23: qty 90, 90d supply, fill #1
  Filled 2022-08-27: qty 90, 90d supply, fill #2
  Filled 2022-11-26: qty 90, 90d supply, fill #3
  Filled 2023-02-18: qty 30, 30d supply, fill #4

## 2022-04-22 ENCOUNTER — Other Ambulatory Visit (HOSPITAL_COMMUNITY): Payer: Self-pay

## 2022-04-23 ENCOUNTER — Other Ambulatory Visit (HOSPITAL_COMMUNITY): Payer: Self-pay

## 2022-04-23 MED ORDER — GABAPENTIN 300 MG PO CAPS
600.0000 mg | ORAL_CAPSULE | Freq: Every day | ORAL | 3 refills | Status: AC
  Filled 2022-04-23: qty 180, 90d supply, fill #0

## 2022-04-23 MED ORDER — OMEPRAZOLE 40 MG PO CPDR
40.0000 mg | DELAYED_RELEASE_CAPSULE | Freq: Two times a day (BID) | ORAL | 3 refills | Status: AC
  Filled 2022-04-23: qty 180, 90d supply, fill #0
  Filled 2022-11-26: qty 180, 90d supply, fill #1
  Filled 2023-02-18: qty 180, 90d supply, fill #2

## 2022-04-23 MED ORDER — ATORVASTATIN CALCIUM 40 MG PO TABS
40.0000 mg | ORAL_TABLET | Freq: Every day | ORAL | 3 refills | Status: AC
  Filled 2022-04-23: qty 90, 90d supply, fill #0
  Filled 2022-08-27: qty 90, 90d supply, fill #1
  Filled 2022-11-26: qty 90, 90d supply, fill #2
  Filled 2023-02-18: qty 90, 90d supply, fill #3

## 2022-04-23 MED ORDER — NABUMETONE 750 MG PO TABS
750.0000 mg | ORAL_TABLET | Freq: Two times a day (BID) | ORAL | 3 refills | Status: DC
  Filled 2022-04-23: qty 180, 90d supply, fill #0
  Filled 2022-11-26: qty 180, 90d supply, fill #1
  Filled 2023-02-18: qty 180, 90d supply, fill #2

## 2022-04-23 MED ORDER — LOSARTAN POTASSIUM 25 MG PO TABS
25.0000 mg | ORAL_TABLET | Freq: Every day | ORAL | 3 refills | Status: AC
  Filled 2022-04-23: qty 90, 90d supply, fill #0
  Filled 2022-08-27: qty 90, 90d supply, fill #1
  Filled 2022-11-26: qty 90, 90d supply, fill #2
  Filled 2023-02-18: qty 90, 90d supply, fill #3

## 2022-04-23 MED ORDER — METOPROLOL SUCCINATE ER 25 MG PO TB24
25.0000 mg | ORAL_TABLET | Freq: Every day | ORAL | 3 refills | Status: AC
  Filled 2022-04-23: qty 90, 90d supply, fill #0
  Filled 2022-08-27: qty 90, 90d supply, fill #1
  Filled 2022-11-26: qty 90, 90d supply, fill #2
  Filled 2023-02-18: qty 90, 90d supply, fill #3

## 2022-04-24 ENCOUNTER — Other Ambulatory Visit (HOSPITAL_COMMUNITY): Payer: Self-pay

## 2022-04-24 MED ORDER — TOLTERODINE TARTRATE ER 4 MG PO CP24
4.0000 mg | ORAL_CAPSULE | Freq: Every day | ORAL | 3 refills | Status: DC
  Filled 2022-04-24: qty 90, 90d supply, fill #0

## 2022-04-25 ENCOUNTER — Other Ambulatory Visit (HOSPITAL_COMMUNITY): Payer: Self-pay

## 2022-04-27 ENCOUNTER — Other Ambulatory Visit (HOSPITAL_COMMUNITY): Payer: Self-pay

## 2022-04-27 ENCOUNTER — Ambulatory Visit
Admission: RE | Admit: 2022-04-27 | Discharge: 2022-04-27 | Disposition: A | Discharge status: Home / Self Care | Payer: No Typology Code available for payment source | Source: Ambulatory Visit | Attending: Internal Medicine | Admitting: Internal Medicine

## 2022-04-27 DIAGNOSIS — Z794 Long term (current) use of insulin: Secondary | ICD-10-CM | POA: Insufficient documentation

## 2022-04-27 DIAGNOSIS — E1165 Type 2 diabetes mellitus with hyperglycemia: Secondary | ICD-10-CM | POA: Insufficient documentation

## 2022-04-27 DIAGNOSIS — R0609 Other forms of dyspnea: Secondary | ICD-10-CM | POA: Insufficient documentation

## 2022-04-27 MED ORDER — TECHNETIUM TC 99M TETROFOSMIN IV KIT
10.0000 | PACK | Freq: Once | INTRAVENOUS | Status: AC | PRN
  Administered 2022-04-27: 10.56 via INTRAVENOUS

## 2022-04-27 MED ORDER — REGADENOSON 0.4 MG/5ML IV SOLN
0.4000 mg | Freq: Once | INTRAVENOUS | Status: AC
  Administered 2022-04-27: 0.4 mg via INTRAVENOUS

## 2022-04-27 MED ORDER — TECHNETIUM TC 99M TETROFOSMIN IV KIT
30.0000 | PACK | Freq: Once | INTRAVENOUS | Status: AC | PRN
  Administered 2022-04-27: 30.06 via INTRAVENOUS

## 2022-05-03 LAB — NM MYOCAR MULTI W/SPECT W/WALL MOTION / EF
Estimated workload: 1
Exercise duration (min): 1 min
Exercise duration (sec): 15 s
LV dias vol: 47 mL (ref 46–106)
LV sys vol: 17 mL
Nuc Stress EF: 64 %
Peak HR: 97 {beats}/min
Percent HR: 60 %
Rest HR: 75 {beats}/min
Rest Nuclear Isotope Dose: 10.6 mCi
SDS: 1
SRS: 23
SSS: 22
ST Depression (mm): 0 mm
Stress Nuclear Isotope Dose: 33.1 mCi
TID: 1.2

## 2022-05-25 ENCOUNTER — Other Ambulatory Visit (HOSPITAL_COMMUNITY): Payer: Self-pay

## 2022-07-10 ENCOUNTER — Other Ambulatory Visit (HOSPITAL_COMMUNITY): Payer: Self-pay

## 2022-07-10 DIAGNOSIS — K219 Gastro-esophageal reflux disease without esophagitis: Secondary | ICD-10-CM | POA: Diagnosis not present

## 2022-07-10 DIAGNOSIS — Z1211 Encounter for screening for malignant neoplasm of colon: Secondary | ICD-10-CM | POA: Diagnosis not present

## 2022-07-10 DIAGNOSIS — R142 Eructation: Secondary | ICD-10-CM | POA: Diagnosis not present

## 2022-07-10 MED ORDER — NA SULFATE-K SULFATE-MG SULF 17.5-3.13-1.6 GM/177ML PO SOLN
ORAL | 0 refills | Status: DC
  Filled 2022-07-10: qty 354, 2d supply, fill #0

## 2022-07-18 ENCOUNTER — Other Ambulatory Visit (HOSPITAL_COMMUNITY): Payer: Self-pay

## 2022-08-20 DIAGNOSIS — M1711 Unilateral primary osteoarthritis, right knee: Secondary | ICD-10-CM | POA: Diagnosis not present

## 2022-08-22 ENCOUNTER — Other Ambulatory Visit: Payer: Self-pay | Admitting: Physician Assistant

## 2022-08-22 DIAGNOSIS — M1712 Unilateral primary osteoarthritis, left knee: Secondary | ICD-10-CM

## 2022-08-27 ENCOUNTER — Other Ambulatory Visit (HOSPITAL_COMMUNITY): Payer: Self-pay

## 2022-08-27 MED ORDER — DULOXETINE HCL 60 MG PO CPEP
60.0000 mg | ORAL_CAPSULE | Freq: Every day | ORAL | 3 refills | Status: AC
  Filled 2022-08-27: qty 90, 90d supply, fill #0
  Filled 2022-11-26: qty 90, 90d supply, fill #1
  Filled 2023-02-18: qty 90, 90d supply, fill #2

## 2022-08-28 ENCOUNTER — Other Ambulatory Visit: Payer: Self-pay

## 2022-08-28 ENCOUNTER — Other Ambulatory Visit (HOSPITAL_COMMUNITY): Payer: Self-pay

## 2022-08-29 ENCOUNTER — Other Ambulatory Visit (HOSPITAL_COMMUNITY): Payer: Self-pay

## 2022-08-31 ENCOUNTER — Other Ambulatory Visit (HOSPITAL_COMMUNITY): Payer: Self-pay

## 2022-09-06 ENCOUNTER — Ambulatory Visit
Admission: RE | Admit: 2022-09-06 | Discharge: 2022-09-06 | Disposition: A | Discharge status: Home / Self Care | Payer: 59 | Source: Ambulatory Visit | Attending: Physician Assistant | Admitting: Physician Assistant

## 2022-09-06 DIAGNOSIS — E538 Deficiency of other specified B group vitamins: Secondary | ICD-10-CM | POA: Diagnosis not present

## 2022-09-06 DIAGNOSIS — M1712 Unilateral primary osteoarthritis, left knee: Secondary | ICD-10-CM | POA: Diagnosis not present

## 2022-09-06 DIAGNOSIS — E1165 Type 2 diabetes mellitus with hyperglycemia: Secondary | ICD-10-CM | POA: Diagnosis not present

## 2022-09-06 DIAGNOSIS — E7800 Pure hypercholesterolemia, unspecified: Secondary | ICD-10-CM | POA: Diagnosis not present

## 2022-09-06 DIAGNOSIS — M25462 Effusion, left knee: Secondary | ICD-10-CM | POA: Diagnosis not present

## 2022-09-06 DIAGNOSIS — Z794 Long term (current) use of insulin: Secondary | ICD-10-CM | POA: Diagnosis not present

## 2022-09-12 ENCOUNTER — Other Ambulatory Visit (HOSPITAL_COMMUNITY): Payer: Self-pay

## 2022-09-12 DIAGNOSIS — D508 Other iron deficiency anemias: Secondary | ICD-10-CM | POA: Diagnosis not present

## 2022-09-12 DIAGNOSIS — Z6841 Body Mass Index (BMI) 40.0 and over, adult: Secondary | ICD-10-CM | POA: Diagnosis not present

## 2022-09-12 DIAGNOSIS — F324 Major depressive disorder, single episode, in partial remission: Secondary | ICD-10-CM | POA: Diagnosis not present

## 2022-09-12 DIAGNOSIS — Z0000 Encounter for general adult medical examination without abnormal findings: Secondary | ICD-10-CM | POA: Diagnosis not present

## 2022-09-12 DIAGNOSIS — E1165 Type 2 diabetes mellitus with hyperglycemia: Secondary | ICD-10-CM | POA: Diagnosis not present

## 2022-09-12 DIAGNOSIS — I10 Essential (primary) hypertension: Secondary | ICD-10-CM | POA: Diagnosis not present

## 2022-09-12 DIAGNOSIS — G4733 Obstructive sleep apnea (adult) (pediatric): Secondary | ICD-10-CM | POA: Diagnosis not present

## 2022-09-12 DIAGNOSIS — J4520 Mild intermittent asthma, uncomplicated: Secondary | ICD-10-CM | POA: Diagnosis not present

## 2022-09-12 DIAGNOSIS — E7800 Pure hypercholesterolemia, unspecified: Secondary | ICD-10-CM | POA: Diagnosis not present

## 2022-09-12 DIAGNOSIS — D472 Monoclonal gammopathy: Secondary | ICD-10-CM | POA: Diagnosis not present

## 2022-09-12 MED ORDER — ZOLPIDEM TARTRATE 5 MG PO TABS
5.0000 mg | ORAL_TABLET | Freq: Every evening | ORAL | 1 refills | Status: DC | PRN
  Filled 2022-09-12 – 2022-09-27 (×2): qty 90, 90d supply, fill #0

## 2022-09-18 ENCOUNTER — Other Ambulatory Visit (HOSPITAL_COMMUNITY): Payer: Self-pay

## 2022-09-24 ENCOUNTER — Other Ambulatory Visit (HOSPITAL_COMMUNITY): Payer: Self-pay

## 2022-09-24 MED ORDER — TRULICITY 3 MG/0.5ML ~~LOC~~ SOAJ
3.0000 mg | SUBCUTANEOUS | 1 refills | Status: DC
  Filled 2022-09-24: qty 2, 28d supply, fill #0
  Filled 2022-09-27: qty 6, 84d supply, fill #0

## 2022-09-24 MED ORDER — HUMULIN R U-500 KWIKPEN 500 UNIT/ML ~~LOC~~ SOPN
PEN_INJECTOR | SUBCUTANEOUS | 1 refills | Status: AC
  Filled 2022-09-24: qty 18, 86d supply, fill #0

## 2022-09-25 ENCOUNTER — Other Ambulatory Visit: Payer: Self-pay

## 2022-09-25 ENCOUNTER — Other Ambulatory Visit (HOSPITAL_COMMUNITY): Payer: Self-pay

## 2022-09-27 ENCOUNTER — Other Ambulatory Visit (HOSPITAL_COMMUNITY): Payer: Self-pay

## 2022-09-27 ENCOUNTER — Other Ambulatory Visit: Payer: Self-pay

## 2022-09-27 DIAGNOSIS — D508 Other iron deficiency anemias: Secondary | ICD-10-CM | POA: Diagnosis not present

## 2022-10-04 ENCOUNTER — Other Ambulatory Visit (HOSPITAL_COMMUNITY): Payer: Self-pay

## 2022-10-17 ENCOUNTER — Encounter: Payer: Self-pay | Admitting: Gastroenterology

## 2022-10-18 ENCOUNTER — Encounter: Payer: Self-pay | Admitting: Gastroenterology

## 2022-10-18 NOTE — H&P (Signed)
Pre-Procedure H&P   Patient ID: [name] is a 62 y.o. female.  Gastroenterology Provider: [name], DO  Referring Provider: Tawni Pummel, PA PCP: Lynnea Ferrier, MD  Date: 10/19/2022  HPI [name] is a 62 y.o. female who presents today for Colonoscopy for colorectal cancer screening .  Patient undergoing screening.  Her last colonoscopy was in January 2013 which was normal.  Patient reports daily to every other day bowel movement without melena or hematochezia.  Maternal grandfather with history of colorectal cancer.  The patient is status postcholecystectomy and tubal ligation  She is on Trulicity which has been held for this procedure (held 1.5 week)  Most recent lab work hemoglobin 13.1 MCV 92 platelets 235,000 creatinine 0.9 iron 89 ferritin 39   Past Medical History:  Diagnosis Date   Arthritis    Asthma without status asthmaticus 08/02/2015   Overview:  mild, not requiring treatment    Cervical spinal stenosis    Degeneration of intervertebral disc of cervical region 04/28/2015   Depression    Diabetes ([facility])    GERD (gastroesophageal reflux disease)    Headache, migraine 08/02/2015   Heartburn    History of kidney stones    HLD (hyperlipidemia)    Hypertension    Kidney stones 07/18/2015   Lumbar stenosis    Pancreatitis    Radiculopathy of cervical region    Sleep apnea    does not wear cpap     Past Surgical History:  Procedure Laterality Date   ANTERIOR CERVICAL DECOMP/DISCECTOMY FUSION N/A 04/20/2016   Procedure: CERVICAL THREE-FOUR  ANTERIOR CERVICAL DECOMPRESSION/DISCECTOMY/FUSION WITH PARTIAL REMOVAL OF PLATE AT CERVICAL FOUR;  Surgeon: Hilda Lias, MD;  Location: [facility];  Service: Neurosurgery;  Laterality: N/A;   CARPAL TUNNEL RELEASE  1996   CERVICAL BIOPSY  W/ LOOP ELECTRODE EXCISION  08/12/2013   CERVICAL FUSION  2009   COLONOSCOPY     COLPOSCOPY  03/26/2013   CYSTOSCOPY WITH STENT PLACEMENT Right 08/24/2015    Procedure: CYSTOSCOPY WITH STENT PLACEMENT;  Surgeon: Malen Gauze, MD;  Location: [facility];  Service: Urology;  Laterality: Right;   NEPHROLITHOTOMY Right 08/24/2015   Procedure: NEPHROLITHOTOMY PERCUTANEOUS WITH SURGEON TO GET ACCESS;  Surgeon: Malen Gauze, MD;  Location: [facility];  Service: Urology;  Laterality: Right;   TUBAL LIGATION      Family History Maternal grandfather-colorectal cancer No other h/o GI disease or malignancy  Review of Systems  Constitutional:  Negative for activity change, appetite change, chills, diaphoresis, fatigue, fever and unexpected weight change.  HENT:  Negative for trouble swallowing and voice change.   Respiratory:  Negative for shortness of breath and wheezing.   Cardiovascular:  Negative for chest pain, palpitations and leg swelling.  Gastrointestinal:  Negative for abdominal distention, abdominal pain, anal bleeding, blood in stool, constipation, diarrhea, nausea, rectal pain and vomiting.  Musculoskeletal:  Negative for arthralgias and myalgias.  Skin:  Negative for color change and pallor.  Neurological:  Negative for dizziness, syncope and weakness.  Psychiatric/Behavioral:  Negative for confusion.   All other systems reviewed and are negative.    Medications No current facility-administered medications on file prior to encounter.   Current Outpatient Medications on File Prior to Encounter  Medication Sig Dispense Refill   atorvastatin (LIPITOR) 40 MG tablet Take 1 tablet (40 mg total) by mouth daily. 91 tablet 3   Continuous Glucose Sensor (DEXCOM G6 SENSOR) MISC Use to monitor blood sugar.  Replace every 10 days. 3 each 11   Continuous Glucose Transmitter (DEXCOM G6 TRANSMITTER) MISC Use to monitor blood sugar.  Replace every 3 months. 1 each 3   gabapentin (NEURONTIN) 300 MG capsule Take 2 capsules (600 mg total) by mouth at bedtime. 182 capsule 3   losartan (COZAAR) 25 MG tablet Take 1 tablet (25 mg total) by mouth once daily  91 tablet 3   metoprolol succinate (TOPROL-XL) 25 MG 24 hr tablet Take 1 tablet (25 mg total) by mouth once daily 91 tablet 3   nabumetone (RELAFEN) 750 MG tablet Take 1 tablet (750 mg total) by mouth 2 (two) times daily. 182 tablet 3   omeprazole (PRILOSEC) 40 MG capsule Take 1 capsule (40 mg total) by mouth 2 (two) times daily before a meal. 182 capsule 3   pioglitazone (ACTOS) 30 MG tablet Take 1 tablet (30 mg total) by mouth daily. 90 tablet 3   rizatriptan (MAXALT) 10 MG tablet Take 10 mg by mouth as needed for migraine. May repeat in 2 hours if needed     tolterodine (DETROL LA) 4 MG 24 hr capsule Take 1 capsule (4 mg total) by mouth daily. 30 capsule 11   cyclobenzaprine (FLEXERIL) 10 MG tablet Take 1 tablet (10 mg total) by mouth 3 (three) times daily as needed for muscle spasms. (Patient not taking: Reported on 10/17/2022) 30 tablet 0   docusate sodium (COLACE) 100 MG capsule Take 1 capsule (100 mg total) by mouth 2 (two) times daily. (Patient not taking: Reported on 10/17/2022) 10 capsule 0   HYDROcodone-acetaminophen (NORCO/VICODIN) 5-325 MG tablet Take 1 tablet by mouth every 4 (four) hours as needed for pain. (Patient not taking: Reported on 10/12/2022) 30 tablet 0   insulin regular human CONCENTRATED (HUMULIN R U-500 KWIKPEN) 500 UNIT/ML kwikpen Inject 35-45 Units into the skin See admin instructions. 45 units in the morning, 40 units at lunch, and 35 units at dinner  She is adjusting mealtime insulin to reflect that she is not eating much post-op (Patient not taking: Reported on 10/17/2022)     insulin regular human CONCENTRATED (HUMULIN R U-500 KWIKPEN) 500 UNIT/ML KwikPen Inject 40 Units into the skin daily before breakfast AND 35 Units daily with lunch AND 30 Units daily before supper. (Patient not taking: Reported on 10/17/2022) 24 mL 3   insulin regular human CONCENTRATED (HUMULIN R) 500 UNIT/ML kwikpen INJECT 45 UNITS UNDER THE SKIN BEFORE BREAKFAST AND 40 UNITS AT LUNCH AND 35 UNITS  BEFORE SUPPER 6 mL 6   Na Sulfate-K Sulfate-Mg Sulf 17.5-3.13-1.6 GM/177ML SOLN Take as directed. Take both bottles at the times instructed by your provider. 354 mL 0   ondansetron (ZOFRAN) 4 MG tablet Take 1 tablet (4 mg total) by mouth 2 (two) times daily as needed. 30 tablet 1   oxyCODONE 10 MG TABS Take 1 tablet (10 mg total) by mouth every 4 (four) hours as needed for severe pain ((score 7 to 10)). (Patient not taking: Reported on 10/17/2022) 42 tablet 0   traMADol (ULTRAM) 50 MG tablet Take 50-100 mg by mouth 2 (two) times daily as needed for moderate pain (depends on pain level if 50-100 mg).  (Patient not taking: Reported on 10/17/2022)     UNIFINE PENTIPS 31G X 5 MM MISC   12    Pertinent medications related to GI and procedure were reviewed by me with the patient prior to the procedure   Current Facility-Administered Medications:    0.9 %  sodium chloride  infusion, , Intravenous, Continuous, [name], DO  sodium chloride         Allergies  Allergen Reactions   Clarithromycin Nausea And Vomiting and Nausea Only   Allergies were reviewed by me prior to the procedure  Objective   Body mass index is 42.9 kg/m. Vitals:   10/19/22 0647  BP: 100/63  Pulse: 80  Resp: 20  Temp: (!) 96.4 F (35.8 C)  TempSrc: Temporal  SpO2: 97%  Weight: 109.9 kg  Height: 5\' 3"  (1.6 m)     Physical Exam Vitals and nursing note reviewed.  Constitutional:      General: She is not in acute distress.    Appearance: Normal appearance. She is obese. She is not ill-appearing, toxic-appearing or diaphoretic.  HENT:     Head: Normocephalic and atraumatic.     Nose: Nose normal.     Mouth/Throat:     Mouth: Mucous membranes are moist.     Pharynx: Oropharynx is clear.  Eyes:     General: No scleral icterus.    Extraocular Movements: Extraocular movements intact.  Cardiovascular:     Rate and Rhythm: Normal rate and regular rhythm.     Heart sounds: Normal heart sounds. No  murmur heard.    No friction rub. No gallop.  Pulmonary:     Effort: Pulmonary effort is normal. No respiratory distress.     Breath sounds: Normal breath sounds. No wheezing, rhonchi or rales.  Abdominal:     General: Bowel sounds are normal. There is no distension.     Palpations: Abdomen is soft.     Tenderness: There is no abdominal tenderness. There is no guarding or rebound.  Musculoskeletal:     Cervical back: Neck supple.     Right lower leg: No edema.     Left lower leg: No edema.  Skin:    General: Skin is warm and dry.     Coloration: Skin is not jaundiced or pale.  Neurological:     General: No focal deficit present.     Mental Status: She is alert and oriented to person, place, and time. Mental status is at baseline.  Psychiatric:        Mood and Affect: Mood normal.        Behavior: Behavior normal.        Thought Content: Thought content normal.        Judgment: Judgment normal.      Assessment:  Ms. KU. REPINSKI is a 62 y.o. female  who presents today for Colonoscopy for colorectal cancer screening .  Plan:  Colonoscopy with possible intervention today  Colonoscopy with possible biopsy, control of bleeding, polypectomy, and interventions as necessary has been discussed with the patient/patient representative. Informed consent was obtained from the patient/patient representative after explaining the indication, nature, and risks of the procedure including but not limited to death, bleeding, perforation, missed neoplasm/lesions, cardiorespiratory compromise, and reaction to medications. Opportunity for questions was given and appropriate answers were provided. Patient/patient representative has verbalized understanding is amenable to undergoing the procedure.   [name], DO  [facility]  Portions of the record may have been created with voice recognition software. Occasional wrong-word or 'sound-a-like' substitutions may have  occurred due to the inherent limitations of voice recognition software.  Read the chart carefully and recognize, using context, where substitutions may have occurred.

## 2022-10-19 ENCOUNTER — Encounter
Admission: RE | Disposition: A | Discharge status: Home / Self Care | Payer: Self-pay | Source: Ambulatory Visit | Attending: Gastroenterology

## 2022-10-19 ENCOUNTER — Encounter: Payer: Self-pay | Admitting: Gastroenterology

## 2022-10-19 ENCOUNTER — Ambulatory Visit
Admission: RE | Admit: 2022-10-19 | Discharge: 2022-10-19 | Disposition: A | Discharge status: Home / Self Care | Payer: 59 | Source: Ambulatory Visit | Attending: Gastroenterology | Admitting: Gastroenterology

## 2022-10-19 ENCOUNTER — Ambulatory Visit: Payer: 59 | Admitting: Anesthesiology

## 2022-10-19 DIAGNOSIS — Z6841 Body Mass Index (BMI) 40.0 and over, adult: Secondary | ICD-10-CM | POA: Insufficient documentation

## 2022-10-19 DIAGNOSIS — E109 Type 1 diabetes mellitus without complications: Secondary | ICD-10-CM | POA: Diagnosis not present

## 2022-10-19 DIAGNOSIS — Z794 Long term (current) use of insulin: Secondary | ICD-10-CM | POA: Diagnosis not present

## 2022-10-19 DIAGNOSIS — K640 First degree hemorrhoids: Secondary | ICD-10-CM | POA: Diagnosis not present

## 2022-10-19 DIAGNOSIS — I10 Essential (primary) hypertension: Secondary | ICD-10-CM | POA: Diagnosis not present

## 2022-10-19 DIAGNOSIS — Z1211 Encounter for screening for malignant neoplasm of colon: Secondary | ICD-10-CM | POA: Diagnosis not present

## 2022-10-19 DIAGNOSIS — G4730 Sleep apnea, unspecified: Secondary | ICD-10-CM | POA: Insufficient documentation

## 2022-10-19 DIAGNOSIS — Z79899 Other long term (current) drug therapy: Secondary | ICD-10-CM | POA: Insufficient documentation

## 2022-10-19 DIAGNOSIS — Z7984 Long term (current) use of oral hypoglycemic drugs: Secondary | ICD-10-CM | POA: Diagnosis not present

## 2022-10-19 DIAGNOSIS — M1990 Unspecified osteoarthritis, unspecified site: Secondary | ICD-10-CM | POA: Diagnosis not present

## 2022-10-19 DIAGNOSIS — K219 Gastro-esophageal reflux disease without esophagitis: Secondary | ICD-10-CM | POA: Insufficient documentation

## 2022-10-19 DIAGNOSIS — K648 Other hemorrhoids: Secondary | ICD-10-CM | POA: Diagnosis not present

## 2022-10-19 HISTORY — DX: Essential (primary) hypertension: I10

## 2022-10-19 HISTORY — PX: COLONOSCOPY: SHX5424

## 2022-10-19 LAB — GLUCOSE, CAPILLARY: Glucose-Capillary: 229 mg/dL — ABNORMAL HIGH (ref 70–99)

## 2022-10-19 SURGERY — COLONOSCOPY
Anesthesia: General

## 2022-10-19 MED ORDER — SODIUM CHLORIDE 0.9 % IV SOLN: INTRAVENOUS | Status: DC

## 2022-10-19 MED ORDER — LIDOCAINE HCL (PF) 2 % IJ SOLN
INTRAMUSCULAR | Status: AC
  Filled 2022-10-19: qty 5

## 2022-10-19 MED ORDER — PROPOFOL 1000 MG/100ML IV EMUL
INTRAVENOUS | Status: AC
  Filled 2022-10-19: qty 100

## 2022-10-19 MED ORDER — LIDOCAINE HCL (CARDIAC) PF 100 MG/5ML IV SOSY
PREFILLED_SYRINGE | INTRAVENOUS | Status: DC | PRN
  Administered 2022-10-19: 50 mg via INTRAVENOUS

## 2022-10-19 MED ORDER — PROPOFOL 10 MG/ML IV BOLUS
INTRAVENOUS | Status: DC | PRN
  Administered 2022-10-19: 70 mg via INTRAVENOUS

## 2022-10-19 MED ORDER — PROPOFOL 500 MG/50ML IV EMUL
INTRAVENOUS | Status: DC | PRN
  Administered 2022-10-19: 75 ug/kg/min via INTRAVENOUS

## 2022-10-19 NOTE — Interval H&P Note (Signed)
History and Physical Interval Note: Preprocedure H&P from 10/19/22  was reviewed and there was no interval change after seeing and examining the patient.  Written consent was obtained from the patient after discussion of risks, benefits, and alternatives. Patient has consented to proceed with Colonoscopy with possible intervention   10/19/2022 7:25 AM  [name]  has presented today for surgery, with the diagnosis of 787.3 (ICD-9-CM) - R14.2 (ICD-10-CM) - [place] 530.81 (ICD-9-CM) - K21.9 (ICD-10-CM) - Gastroesophageal reflux disease, unspecified whether esophagitis present V76.51 (ICD-9-CM) - Z12.11 (ICD-10-CM) - Colon cancer screening.  The various methods of treatment have been discussed with the patient and family. After consideration of risks, benefits and other options for treatment, the patient has consented to  Procedure(s): COLONOSCOPY (N/A) as a surgical intervention.  The patient's history has been reviewed, patient examined, no change in status, stable for surgery.  I have reviewed the patient's chart and labs.  Questions were answered to the patient's satisfaction.     [name]

## 2022-10-19 NOTE — Transfer of Care (Signed)
Immediate Anesthesia Transfer of Care Note  Patient: [name]  Procedure(s) Performed: COLONOSCOPY  Patient Location: PACU and Endoscopy Unit  Anesthesia Type:General  Level of Consciousness: awake, oriented, and patient cooperative  Airway & Oxygen Therapy: Patient Spontanous Breathing  Post-op Assessment: Report given to RN and Post -op Vital signs reviewed and stable  Post vital signs: Reviewed and stable  Last Vitals:  Vitals Value Taken Time  BP 113/73 10/19/22 0751  Temp    Pulse 83 10/19/22 0752  Resp 18 10/19/22 0752  SpO2 97 % 10/19/22 0752  Vitals shown include unvalidated device data.  Last Pain:  Vitals:   10/19/22 0751  TempSrc:   PainSc: 0-No pain         Complications: No notable events documented.

## 2022-10-19 NOTE — Anesthesia Postprocedure Evaluation (Signed)
Anesthesia Post Note  Patient: [name]  Procedure(s) Performed: COLONOSCOPY  Patient location during evaluation: PACU Anesthesia Type: General Level of consciousness: awake and awake and alert Pain management: pain level controlled Vital Signs Assessment: post-procedure vital signs reviewed and stable Respiratory status: spontaneous breathing and nonlabored ventilation Cardiovascular status: stable Anesthetic complications: no   No notable events documented.   Last Vitals:  Vitals:   10/19/22 0647 10/19/22 0751  BP: 100/63 113/73  Pulse: 80   Resp: 20   Temp: (!) 35.8 C (!) 35.6 C  SpO2: 97%     Last Pain:  Vitals:   10/19/22 0801  TempSrc:   PainSc: 0-No pain                 VAN STAVEREN,Daila Elbert

## 2022-10-19 NOTE — Anesthesia Preprocedure Evaluation (Signed)
Anesthesia Evaluation  Patient identified by MRN, date of birth, ID band Patient awake    Reviewed: Allergy & Precautions, NPO status , Patient's Chart, lab work & pertinent test results  Airway Mallampati: III  TM Distance: >3 FB Neck ROM: full    Dental  (+) Teeth Intact   Pulmonary neg pulmonary ROS, sleep apnea and Continuous Positive Airway Pressure Ventilation    Pulmonary exam normal breath sounds clear to auscultation       Cardiovascular Exercise Tolerance: Good hypertension, Pt. on medications negative cardio ROS Normal cardiovascular exam Rhythm:Regular Rate:Normal     Neuro/Psych  Headaches   Depression    negative neurological ROS  negative psych ROS   GI/Hepatic negative GI ROS, Neg liver ROS,GERD  Medicated and Controlled,,  Endo/Other  negative endocrine ROSdiabetes, Poorly Controlled, Type 1, Insulin Dependent  Morbid obesity  Renal/GU negative Renal ROS  negative genitourinary   Musculoskeletal  (+) Arthritis ,    Abdominal  (+) + obese  Peds negative pediatric ROS (+)  Hematology negative hematology ROS (+) Blood dyscrasia, anemia   Anesthesia Other Findings Past Medical History: No date: Arthritis 08/02/2015: Asthma without status asthmaticus     Comment:  Overview:  mild, not requiring treatment  No date: Cervical spinal stenosis 04/28/2015: Degeneration of intervertebral disc of cervical region No date: Depression No date: Diabetes (HCC) No date: GERD (gastroesophageal reflux disease) 08/02/2015: Headache, migraine No date: Heartburn No date: History of kidney stones No date: HLD (hyperlipidemia) No date: Hypertension 07/18/2015: Kidney stones No date: Lumbar stenosis No date: Pancreatitis No date: Radiculopathy of cervical region No date: Sleep apnea     Comment:  does not wear cpap   Past Surgical History: 04/20/2016: ANTERIOR CERVICAL DECOMP/DISCECTOMY FUSION; N/A     Comment:   Procedure: CERVICAL THREE-FOUR  ANTERIOR CERVICAL               DECOMPRESSION/DISCECTOMY/FUSION WITH PARTIAL REMOVAL OF               PLATE AT CERVICAL FOUR;  Surgeon: Hilda Lias, MD;                Location: [facility] OR;  Service: Neurosurgery;  Laterality:               N/A; 1996: CARPAL TUNNEL RELEASE 08/12/2013: CERVICAL BIOPSY  W/ LOOP ELECTRODE EXCISION 2009: CERVICAL FUSION No date: COLONOSCOPY 03/26/2013: COLPOSCOPY 08/24/2015: CYSTOSCOPY WITH STENT PLACEMENT; Right     Comment:  Procedure: CYSTOSCOPY WITH STENT PLACEMENT;  Surgeon:               Malen Gauze, MD;  Location: [facility];  Service:               Urology;  Laterality: Right; 08/24/2015: NEPHROLITHOTOMY; Right     Comment:  Procedure: NEPHROLITHOTOMY PERCUTANEOUS WITH SURGEON TO               GET ACCESS;  Surgeon: Malen Gauze, MD;  Location:               [facility]S;  Service: Urology;  Laterality: Right; No date: TUBAL LIGATION  BMI    Body Mass Index: 42.90 kg/m      Reproductive/Obstetrics negative OB ROS                             Anesthesia Physical Anesthesia Plan  ASA: 3  Anesthesia Plan: General   Post-op  Pain Management:    Induction: Intravenous  PONV Risk Score and Plan: Propofol infusion and TIVA  Airway Management Planned: Natural Airway  Additional Equipment:   Intra-op Plan:   Post-operative Plan:   Informed Consent: I have reviewed the patients History and Physical, chart, labs and discussed the procedure including the risks, benefits and alternatives for the proposed anesthesia with the patient or authorized representative who has indicated his/her understanding and acceptance.     Dental Advisory Given  Plan Discussed with: CRNA and Surgeon  Anesthesia Plan Comments:        Anesthesia Quick Evaluation

## 2022-10-19 NOTE — Op Note (Signed)
[facility] Gastroenterology Patient Name: [name] Procedure Date: 10/19/2022 7:21 AM MRN: [id number] Account #: [id number] Date of Birth: Aug 13, 1960 Admit Type: Outpatient Age: 62 Room: [facility] ENDO ROOM 1 Gender: Female Note Status: Finalized Instrument Name: Colonoscope [id number] Procedure:             Colonoscopy Indications:           Screening for colorectal malignant neoplasm Providers:             [name] DO, DO Medicines:             Monitored Anesthesia Care Complications:         No immediate complications. Estimated blood loss: None. Procedure:             Pre-Anesthesia Assessment:                        - Prior to the procedure, a History and Physical was                         performed, and patient medications and allergies were                         reviewed. The patient is competent. The risks and                         benefits of the procedure and the sedation options and                         risks were discussed with the patient. All questions                         were answered and informed consent was obtained.                         Patient identification and proposed procedure were                         verified by the physician, the nurse, the anesthetist                         and the technician in the endoscopy suite. Mental                         Status Examination: alert and oriented. Airway                         Examination: normal oropharyngeal airway and neck                         mobility. Respiratory Examination: clear to                         auscultation. CV Examination: RRR, no murmurs, no S3                         or S4. Prophylactic Antibiotics: The patient does not  require prophylactic antibiotics. Prior                         Anticoagulants: The patient has taken no anticoagulant                         or antiplatelet agents. ASA Grade Assessment: III - A                          patient with severe systemic disease. After reviewing                         the risks and benefits, the patient was deemed in                         satisfactory condition to undergo the procedure. The                         anesthesia plan was to use monitored anesthesia care                         (MAC). Immediately prior to administration of                         medications, the patient was re-assessed for adequacy                         to receive sedatives. The heart rate, respiratory                         rate, oxygen saturations, blood pressure, adequacy of                         pulmonary ventilation, and response to care were                         monitored throughout the procedure. The physical                         status of the patient was re-assessed after the                         procedure.                        After obtaining informed consent, the colonoscope was                         passed under direct vision. Throughout the procedure,                         the patient's blood pressure, pulse, and oxygen                         saturations were monitored continuously. The                         Colonoscope was introduced through the anus and  advanced to the the terminal ileum, with                         identification of the appendiceal orifice and IC                         valve. The colonoscopy was performed without                         difficulty. The patient tolerated the procedure well.                         The quality of the bowel preparation was evaluated                         using the BBPS [facility] Bowel Preparation Scale) with                         scores of: Right Colon = 3, Transverse Colon = 3 and                         Left Colon = 3 (entire mucosa seen well with no                         residual staining, small fragments of stool or opaque                         liquid). The  total BBPS score equals 9. The terminal                         ileum, ileocecal valve, appendiceal orifice, and                         rectum were photographed. Findings:      The perianal and digital rectal examinations were normal. Pertinent       negatives include normal sphincter tone.      The terminal ileum appeared normal. Estimated blood loss: none.      Retroflexion in the right colon was performed.      Non-bleeding internal hemorrhoids were found during retroflexion. The       hemorrhoids were Grade I (internal hemorrhoids that do not prolapse).       Estimated blood loss: none.      The entire examined colon appeared normal on direct and retroflexion       views. Impression:            - The examined portion of the ileum was normal.                        - Non-bleeding internal hemorrhoids.                        - The entire examined colon is normal on direct and                         retroflexion views.                        - No  specimens collected. Recommendation:        - Patient has a contact number available for                         emergencies. The signs and symptoms of potential                         delayed complications were discussed with the patient.                         Return to normal activities tomorrow. Written                         discharge instructions were provided to the patient.                        - Discharge patient to home.                        - Resume previous diet.                        - Continue present medications.                        - Repeat colonoscopy in 10 years for screening                         purposes.                        - Return to referring physician as previously                         scheduled.                        - Can resume trulicity                        - The findings and recommendations were discussed with                         the patient. Procedure Code(s):     --- Professional  ---                        [phone], Colonoscopy, flexible; diagnostic, including                         collection of specimen(s) by brushing or washing, when                         performed (separate procedure) Diagnosis Code(s):     --- Professional ---                        Z12.11, Encounter for screening for malignant neoplasm                         of colon  K64.0, First degree hemorrhoids CPT copyright 2022 American Medical Association. All rights reserved. The codes documented in this report are preliminary and upon coder review may  be revised to meet current compliance requirements. Attending Participation:      I personally performed the entire procedure. Elfredia Nevins, DO [name] DO, DO 10/19/2022 7:51:56 AM This report has been signed electronically. Number of Addenda: 0 Note Initiated On: 10/19/2022 7:21 AM Scope Withdrawal Time: 0 hours 8 minutes 44 seconds  Total Procedure Duration: 0 hours 11 minutes 33 seconds  Estimated Blood Loss:  Estimated blood loss: none.      [facility]

## 2022-11-05 NOTE — Progress Notes (Unsigned)
PCP: Lynnea Ferrier, MD   No chief complaint on file.   HPI:      [name] is a 62 y.o. G1P1001 whose LMP was No LMP recorded. Patient is postmenopausal., presents today for her annual examination.  Her menses are {norm/abn:715}, lasting {number: 22536} days.  Dysmenorrhea {dysmen:716}. She {does:18564} have intermenstrual bleeding. She {does:18564} have vasomotor sx.   Sex activity: {sex active: 315163}. She {does:18564} have vaginal dryness.  Last Pap: 10/07/20  Results were: no abnormalities /neg HPV DNA.  Hx of STDs: {STD hx:14358}  Last mammogram: 10/28/20  Results were: normal--routine follow-up in 12 months There is no FH of breast cancer. There is no FH of ovarian cancer. The patient {does:18564} do self-breast exams.  Colonoscopy: 5/24 Repeat due after 10*** years.   Tobacco use: {tob:20664} Alcohol use: {Alcohol:11675} No drug use Exercise: {exercise:31265}  She {does:18564} get adequate calcium and Vitamin D in her diet.  Labs with PCP.   Patient Active Problem List   Diagnosis Date Noted   Vaginal atrophy 08/31/2019   Sleep apnea 12/08/2018   Depression 12/08/2018   Lumbar stenosis with neurogenic claudication 11/17/2018   Essential (primary) hypertension 03/07/2018   Uncontrolled type 2 diabetes mellitus with hyperglycemia, with long-term current use of insulin (HCC) 03/07/2018   Type 2 diabetes mellitus with kidney complication, with long-term current use of insulin (HCC) 03/07/2018   Anemia, iron deficiency 04/03/2017   Dyspnea on exertion 01/08/2017   B12 deficiency 11/16/2016   Spondylolisthesis of lumbar region 06/18/2016   Cervical stenosis of spinal canal 04/20/2016   Isolated proteinuria 11/04/2015   Mass of right side of neck 10/18/2015   Renal calculi 08/24/2015   Asthma without status asthmaticus 08/02/2015   Degeneration of intervertebral disc of lumbar region 08/02/2015   Clinical depression 08/02/2015   Headache, migraine  08/02/2015   Plantar fasciitis 08/02/2015   Apnea, sleep 08/02/2015   Kidney stones 07/18/2015   Microscopic hematuria 07/18/2015   Recurrent UTI 07/18/2015   MGUS (monoclonal gammopathy of unknown significance) 06/01/2015   Pure hypercholesterolemia 06/01/2015   Cervical radiculitis 04/28/2015   Degeneration of intervertebral disc of cervical region 04/28/2015   Type 2 diabetes mellitus (HCC) 02/11/2015   Type 2 diabetes mellitus with hyperglycemia (HCC) 02/11/2015   Neuritis or radiculitis due to rupture of lumbar intervertebral disc 11/26/2013   Degenerative arthritis of lumbar spine 11/26/2013   Lumbar canal stenosis 11/26/2013    Past Surgical History:  Procedure Laterality Date   ANTERIOR CERVICAL DECOMP/DISCECTOMY FUSION N/A 04/20/2016   Procedure: CERVICAL THREE-FOUR  ANTERIOR CERVICAL DECOMPRESSION/DISCECTOMY/FUSION WITH PARTIAL REMOVAL OF PLATE AT CERVICAL FOUR;  Surgeon: Hilda Lias, MD;  Location: [facility] OR;  Service: Neurosurgery;  Laterality: N/A;   CARPAL TUNNEL RELEASE  1996   CERVICAL BIOPSY  W/ LOOP ELECTRODE EXCISION  08/12/2013   CERVICAL FUSION  2009   COLONOSCOPY     COLONOSCOPY N/A 10/19/2022   Procedure: COLONOSCOPY;  Surgeon: [name], DO;  Location: [facility] ENDOSCOPY;  Service: Gastroenterology;  Laterality: N/A;   COLPOSCOPY  03/26/2013   CYSTOSCOPY WITH STENT PLACEMENT Right 08/24/2015   Procedure: CYSTOSCOPY WITH STENT PLACEMENT;  Surgeon: Malen Gauze, MD;  Location: [facility];  Service: Urology;  Laterality: Right;   NEPHROLITHOTOMY Right 08/24/2015   Procedure: NEPHROLITHOTOMY PERCUTANEOUS WITH SURGEON TO GET ACCESS;  Surgeon: Malen Gauze, MD;  Location: [facility];  Service: Urology;  Laterality: Right;   TUBAL LIGATION      Family History  Problem Relation Age of Onset   Heart failure Mother    Heart attack Father    Colon cancer Maternal Grandfather 23   Kidney disease Neg Hx    Bladder Cancer Neg Hx    Breast cancer Neg Hx      Social History   Socioeconomic History   Marital status: Married    Spouse name: Not on file   Number of children: Not on file   Years of education: Not on file   Highest education level: Not on file  Occupational History   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  Tobacco Use   Smoking status: Never   Smokeless tobacco: Never  Vaping Use   Vaping Use: Never used  Substance and Sexual Activity   Alcohol use: Yes    Alcohol/week: 0.0 standard drinks of alcohol    Comment: occasional   Drug use: No   Sexual activity: Yes  Other Topics Concern   Not on file  Social History Narrative   Not on file   Social Determinants of Health   Financial Resource Strain: Not on file  Food Insecurity: Not on file  Transportation Needs: Not on file  Physical Activity: Not on file  Stress: Not on file  Social Connections: Not on file  Intimate Partner Violence: Not on file     Current Outpatient Medications:    atorvastatin (LIPITOR) 40 MG tablet, Take 1 tablet (40 mg total) by mouth daily., Disp: 91 tablet, Rfl: 3   Continuous Glucose Sensor (DEXCOM G6 SENSOR) MISC, Use to monitor blood sugar.  Replace every 10 days., Disp: 3 each, Rfl: 11   Continuous Glucose Transmitter (DEXCOM G6 TRANSMITTER) MISC, Use to monitor blood sugar.  Replace every 3 months., Disp: 1 each, Rfl: 3   cyclobenzaprine (FLEXERIL) 10 MG tablet, Take 1 tablet (10 mg total) by mouth 3 (three) times daily as needed for muscle spasms. (Patient not taking: Reported on 10/17/2022), Disp: 30 tablet, Rfl: 0   docusate sodium (COLACE) 100 MG capsule, Take 1 capsule (100 mg total) by mouth 2 (two) times daily. (Patient not taking: Reported on 10/17/2022), Disp: 10 capsule, Rfl: 0   Dulaglutide (TRULICITY) 3 MG/0.5ML SOPN, Inject 3 mg into the skin once a week., Disp: 6 mL, Rfl: 1   DULoxetine (CYMBALTA) 60 MG capsule, Take 1 capsule (60 mg total) by mouth daily., Disp: 90 capsule, Rfl: 3   gabapentin (NEURONTIN)  300 MG capsule, Take 2 capsules (600 mg total) by mouth at bedtime., Disp: 182 capsule, Rfl: 3   HYDROcodone-acetaminophen (NORCO/VICODIN) 5-325 MG tablet, Take 1 tablet by mouth every 4 (four) hours as needed for pain. (Patient not taking: Reported on 10/12/2022), Disp: 30 tablet, Rfl: 0   insulin regular human CONCENTRATED (HUMULIN R U-500 KWIKPEN) 500 UNIT/ML kwikpen, Inject 35-45 Units into the skin See admin instructions. 45 units in the morning, 40 units at lunch, and 35 units at dinner  She is adjusting mealtime insulin to reflect that she is not eating much post-op (Patient not taking: Reported on 10/17/2022), Disp: , Rfl:    insulin regular human CONCENTRATED (HUMULIN R U-500 KWIKPEN) 500 UNIT/ML KwikPen, Inject 40 Units into the skin daily before breakfast AND 35 Units daily with lunch AND 30 Units daily before supper. (Patient not taking: Reported on 10/17/2022), Disp: 24 mL, Rfl: 3   insulin regular human CONCENTRATED (HUMULIN R U-500 KWIKPEN) 500 UNIT/ML KwikPen, INJECT 40 UNITS UNDER THE SKIN BEFORE BREAKFAST AND 35 UNITS AT  LUNCH AND 30 UNITS BEFORE SUPPER, Disp: 24 mL, Rfl: 1   insulin regular human CONCENTRATED (HUMULIN R) 500 UNIT/ML kwikpen, INJECT 45 UNITS UNDER THE SKIN BEFORE BREAKFAST AND 40 UNITS AT LUNCH AND 35 UNITS BEFORE SUPPER, Disp: 6 mL, Rfl: 6   losartan (COZAAR) 25 MG tablet, Take 1 tablet (25 mg total) by mouth once daily, Disp: 91 tablet, Rfl: 3   metoprolol succinate (TOPROL-XL) 25 MG 24 hr tablet, Take 1 tablet (25 mg total) by mouth once daily, Disp: 91 tablet, Rfl: 3   Na Sulfate-K Sulfate-Mg Sulf 17.5-3.13-1.6 GM/177ML SOLN, Take as directed. Take both bottles at the times instructed by your provider., Disp: 354 mL, Rfl: 0   nabumetone (RELAFEN) 750 MG tablet, Take 1 tablet (750 mg total) by mouth 2 (two) times daily., Disp: 182 tablet, Rfl: 3   omeprazole (PRILOSEC) 40 MG capsule, Take 1 capsule (40 mg total) by mouth 2 (two) times daily before a meal., Disp: 182  capsule, Rfl: 3   ondansetron (ZOFRAN) 4 MG tablet, Take 1 tablet (4 mg total) by mouth 2 (two) times daily as needed., Disp: 30 tablet, Rfl: 1   oxyCODONE 10 MG TABS, Take 1 tablet (10 mg total) by mouth every 4 (four) hours as needed for severe pain ((score 7 to 10)). (Patient not taking: Reported on 10/17/2022), Disp: 42 tablet, Rfl: 0   pioglitazone (ACTOS) 30 MG tablet, Take 1 tablet (30 mg total) by mouth daily., Disp: 90 tablet, Rfl: 3   rizatriptan (MAXALT) 10 MG tablet, Take 10 mg by mouth as needed for migraine. May repeat in 2 hours if needed, Disp: , Rfl:    tolterodine (DETROL LA) 4 MG 24 hr capsule, Take 1 capsule (4 mg total) by mouth daily., Disp: 30 capsule, Rfl: 11   traMADol (ULTRAM) 50 MG tablet, Take 50-100 mg by mouth 2 (two) times daily as needed for moderate pain (depends on pain level if 50-100 mg).  (Patient not taking: Reported on 10/17/2022), Disp: , Rfl:    UNIFINE PENTIPS 31G X 5 MM MISC, , Disp: , Rfl: 12   zolpidem (AMBIEN) 5 MG tablet, Take 1 tablet (5 mg total) by mouth at bedtime as needed for sleep, Disp: 90 tablet, Rfl: 1     ROS:  Review of Systems BREAST: No symptoms    Objective: There were no vitals taken for this visit.   OBGyn Exam  Results: No results found for this or any previous visit (from the past 24 hour(s)).  Assessment/Plan:  No diagnosis found.   No orders of the defined types were placed in this encounter.           GYN counsel {counseling: 16159}    F/U  No follow-ups on file.  Deanna Boehlke B. Scarlett Portlock, PA-C 11/05/2022 5:14 PM

## 2022-11-06 ENCOUNTER — Encounter: Payer: Self-pay | Admitting: Obstetrics and Gynecology

## 2022-11-06 ENCOUNTER — Ambulatory Visit (INDEPENDENT_AMBULATORY_CARE_PROVIDER_SITE_OTHER): Payer: 59 | Admitting: Obstetrics and Gynecology

## 2022-11-06 VITALS — BP 132/86 | Ht 63.0 in | Wt 253.0 lb

## 2022-11-06 DIAGNOSIS — Z1211 Encounter for screening for malignant neoplasm of colon: Secondary | ICD-10-CM

## 2022-11-06 DIAGNOSIS — Z01419 Encounter for gynecological examination (general) (routine) without abnormal findings: Secondary | ICD-10-CM | POA: Diagnosis not present

## 2022-11-06 DIAGNOSIS — Z1231 Encounter for screening mammogram for malignant neoplasm of breast: Secondary | ICD-10-CM

## 2022-11-06 NOTE — Patient Instructions (Addendum)
I value your feedback and you entrusting us with your care. If you get a [facility] patient survey, I would appreciate you taking the time to let us know about your experience today. Thank you!  [facility] ([place])--[phone]  

## 2022-11-26 ENCOUNTER — Other Ambulatory Visit (HOSPITAL_COMMUNITY): Payer: Self-pay

## 2022-11-26 MED ORDER — PIOGLITAZONE HCL 30 MG PO TABS
30.0000 mg | ORAL_TABLET | Freq: Every day | ORAL | 0 refills | Status: DC
  Filled 2022-11-26: qty 90, 90d supply, fill #0

## 2022-12-06 DIAGNOSIS — Z794 Long term (current) use of insulin: Secondary | ICD-10-CM | POA: Diagnosis not present

## 2022-12-06 DIAGNOSIS — D508 Other iron deficiency anemias: Secondary | ICD-10-CM | POA: Diagnosis not present

## 2022-12-06 DIAGNOSIS — E1165 Type 2 diabetes mellitus with hyperglycemia: Secondary | ICD-10-CM | POA: Diagnosis not present

## 2022-12-06 DIAGNOSIS — E538 Deficiency of other specified B group vitamins: Secondary | ICD-10-CM | POA: Diagnosis not present

## 2022-12-06 DIAGNOSIS — E7800 Pure hypercholesterolemia, unspecified: Secondary | ICD-10-CM | POA: Diagnosis not present

## 2022-12-06 DIAGNOSIS — D472 Monoclonal gammopathy: Secondary | ICD-10-CM | POA: Diagnosis not present

## 2022-12-13 DIAGNOSIS — J4520 Mild intermittent asthma, uncomplicated: Secondary | ICD-10-CM | POA: Diagnosis not present

## 2022-12-13 DIAGNOSIS — E538 Deficiency of other specified B group vitamins: Secondary | ICD-10-CM | POA: Diagnosis not present

## 2022-12-13 DIAGNOSIS — M5030 Other cervical disc degeneration, unspecified cervical region: Secondary | ICD-10-CM | POA: Diagnosis not present

## 2022-12-13 DIAGNOSIS — Z6841 Body Mass Index (BMI) 40.0 and over, adult: Secondary | ICD-10-CM | POA: Diagnosis not present

## 2022-12-13 DIAGNOSIS — D508 Other iron deficiency anemias: Secondary | ICD-10-CM | POA: Diagnosis not present

## 2022-12-13 DIAGNOSIS — F324 Major depressive disorder, single episode, in partial remission: Secondary | ICD-10-CM | POA: Diagnosis not present

## 2022-12-13 DIAGNOSIS — I10 Essential (primary) hypertension: Secondary | ICD-10-CM | POA: Diagnosis not present

## 2022-12-13 DIAGNOSIS — E1129 Type 2 diabetes mellitus with other diabetic kidney complication: Secondary | ICD-10-CM | POA: Diagnosis not present

## 2022-12-13 DIAGNOSIS — E1165 Type 2 diabetes mellitus with hyperglycemia: Secondary | ICD-10-CM | POA: Diagnosis not present

## 2022-12-13 DIAGNOSIS — D472 Monoclonal gammopathy: Secondary | ICD-10-CM | POA: Diagnosis not present

## 2022-12-17 ENCOUNTER — Other Ambulatory Visit (HOSPITAL_COMMUNITY): Payer: Self-pay

## 2022-12-17 DIAGNOSIS — E1169 Type 2 diabetes mellitus with other specified complication: Secondary | ICD-10-CM | POA: Diagnosis not present

## 2022-12-17 DIAGNOSIS — E1159 Type 2 diabetes mellitus with other circulatory complications: Secondary | ICD-10-CM | POA: Diagnosis not present

## 2022-12-17 DIAGNOSIS — E669 Obesity, unspecified: Secondary | ICD-10-CM | POA: Diagnosis not present

## 2022-12-17 DIAGNOSIS — Z794 Long term (current) use of insulin: Secondary | ICD-10-CM | POA: Diagnosis not present

## 2022-12-17 MED ORDER — PIOGLITAZONE HCL 30 MG PO TABS
30.0000 mg | ORAL_TABLET | Freq: Every day | ORAL | 3 refills | Status: AC
  Filled 2022-12-17 – 2023-02-18 (×2): qty 90, 90d supply, fill #0

## 2022-12-17 MED ORDER — DEXCOM G7 SENSOR MISC
3 refills | Status: DC
  Filled 2022-12-17 – 2023-01-04 (×3): qty 9, 90d supply, fill #0

## 2022-12-17 MED ORDER — HUMULIN R U-500 KWIKPEN 500 UNIT/ML ~~LOC~~ SOPN
PEN_INJECTOR | SUBCUTANEOUS | 3 refills | Status: AC
  Filled 2022-12-17: qty 18, 90d supply, fill #0

## 2022-12-17 MED ORDER — MOUNJARO 10 MG/0.5ML ~~LOC~~ SOAJ
10.0000 mg | SUBCUTANEOUS | 3 refills | Status: DC
  Filled 2022-12-17: qty 2, 28d supply, fill #0
  Filled 2023-01-15: qty 2, 28d supply, fill #1
  Filled 2023-02-18 – 2023-02-19 (×2): qty 2, 28d supply, fill #2
  Filled 2023-03-20 (×2): qty 6, 84d supply, fill #3

## 2022-12-17 MED ORDER — DEXCOM G7 RECEIVER DEVI
1.0000 | 0 refills | Status: AC
  Filled 2022-12-17: qty 1, 90d supply, fill #0
  Filled 2023-01-04: qty 1, 30d supply, fill #0

## 2022-12-19 ENCOUNTER — Other Ambulatory Visit (HOSPITAL_COMMUNITY): Payer: Self-pay

## 2022-12-20 ENCOUNTER — Other Ambulatory Visit: Payer: Self-pay

## 2022-12-20 ENCOUNTER — Other Ambulatory Visit (HOSPITAL_COMMUNITY): Payer: Self-pay

## 2022-12-21 ENCOUNTER — Other Ambulatory Visit (HOSPITAL_COMMUNITY): Payer: Self-pay

## 2022-12-27 ENCOUNTER — Other Ambulatory Visit (HOSPITAL_COMMUNITY): Payer: Self-pay

## 2022-12-31 ENCOUNTER — Other Ambulatory Visit (HOSPITAL_COMMUNITY): Payer: Self-pay

## 2023-01-01 ENCOUNTER — Other Ambulatory Visit (HOSPITAL_COMMUNITY): Payer: Self-pay

## 2023-01-04 ENCOUNTER — Other Ambulatory Visit (HOSPITAL_COMMUNITY): Payer: Self-pay

## 2023-02-06 ENCOUNTER — Other Ambulatory Visit (HOSPITAL_COMMUNITY): Payer: Self-pay

## 2023-02-18 ENCOUNTER — Other Ambulatory Visit (HOSPITAL_COMMUNITY): Payer: Self-pay

## 2023-02-18 MED ORDER — TOLTERODINE TARTRATE ER 4 MG PO CP24
4.0000 mg | ORAL_CAPSULE | Freq: Every day | ORAL | 3 refills | Status: DC
  Filled 2023-02-18: qty 90, 90d supply, fill #0

## 2023-02-19 ENCOUNTER — Other Ambulatory Visit (HOSPITAL_COMMUNITY): Payer: Self-pay

## 2023-02-19 ENCOUNTER — Other Ambulatory Visit: Payer: Self-pay

## 2023-03-08 DIAGNOSIS — G4733 Obstructive sleep apnea (adult) (pediatric): Secondary | ICD-10-CM | POA: Diagnosis not present

## 2023-03-08 DIAGNOSIS — G4739 Other sleep apnea: Secondary | ICD-10-CM | POA: Diagnosis not present

## 2023-03-20 ENCOUNTER — Other Ambulatory Visit (HOSPITAL_COMMUNITY): Payer: Self-pay

## 2023-03-21 ENCOUNTER — Other Ambulatory Visit (HOSPITAL_COMMUNITY): Payer: Self-pay

## 2023-03-22 DIAGNOSIS — E785 Hyperlipidemia, unspecified: Secondary | ICD-10-CM | POA: Diagnosis not present

## 2023-03-22 DIAGNOSIS — E538 Deficiency of other specified B group vitamins: Secondary | ICD-10-CM | POA: Diagnosis not present

## 2023-03-22 DIAGNOSIS — Z794 Long term (current) use of insulin: Secondary | ICD-10-CM | POA: Diagnosis not present

## 2023-03-22 DIAGNOSIS — E1129 Type 2 diabetes mellitus with other diabetic kidney complication: Secondary | ICD-10-CM | POA: Diagnosis not present

## 2023-03-22 DIAGNOSIS — I10 Essential (primary) hypertension: Secondary | ICD-10-CM | POA: Diagnosis not present

## 2023-03-28 DIAGNOSIS — F324 Major depressive disorder, single episode, in partial remission: Secondary | ICD-10-CM | POA: Diagnosis not present

## 2023-03-28 DIAGNOSIS — D508 Other iron deficiency anemias: Secondary | ICD-10-CM | POA: Diagnosis not present

## 2023-03-28 DIAGNOSIS — E1165 Type 2 diabetes mellitus with hyperglycemia: Secondary | ICD-10-CM | POA: Diagnosis not present

## 2023-03-28 DIAGNOSIS — J4520 Mild intermittent asthma, uncomplicated: Secondary | ICD-10-CM | POA: Diagnosis not present

## 2023-03-28 DIAGNOSIS — I10 Essential (primary) hypertension: Secondary | ICD-10-CM | POA: Diagnosis not present

## 2023-03-28 DIAGNOSIS — Z6841 Body Mass Index (BMI) 40.0 and over, adult: Secondary | ICD-10-CM | POA: Diagnosis not present

## 2023-03-28 DIAGNOSIS — E538 Deficiency of other specified B group vitamins: Secondary | ICD-10-CM | POA: Diagnosis not present

## 2023-03-28 DIAGNOSIS — E7800 Pure hypercholesterolemia, unspecified: Secondary | ICD-10-CM | POA: Diagnosis not present

## 2023-03-28 DIAGNOSIS — E1129 Type 2 diabetes mellitus with other diabetic kidney complication: Secondary | ICD-10-CM | POA: Diagnosis not present

## 2023-03-28 DIAGNOSIS — I700 Atherosclerosis of aorta: Secondary | ICD-10-CM | POA: Diagnosis not present

## 2023-04-03 ENCOUNTER — Other Ambulatory Visit: Payer: Self-pay | Admitting: Internal Medicine

## 2023-04-03 DIAGNOSIS — M5030 Other cervical disc degeneration, unspecified cervical region: Secondary | ICD-10-CM

## 2023-04-12 ENCOUNTER — Ambulatory Visit
Admission: RE | Admit: 2023-04-12 | Discharge: 2023-04-12 | Disposition: A | Discharge status: Home / Self Care | Payer: 59 | Source: Ambulatory Visit | Attending: Internal Medicine | Admitting: Internal Medicine

## 2023-04-12 DIAGNOSIS — M5030 Other cervical disc degeneration, unspecified cervical region: Secondary | ICD-10-CM | POA: Diagnosis not present

## 2023-04-12 DIAGNOSIS — M47812 Spondylosis without myelopathy or radiculopathy, cervical region: Secondary | ICD-10-CM | POA: Diagnosis not present

## 2023-04-12 DIAGNOSIS — M5021 Other cervical disc displacement,  high cervical region: Secondary | ICD-10-CM | POA: Diagnosis not present

## 2023-04-12 DIAGNOSIS — M4802 Spinal stenosis, cervical region: Secondary | ICD-10-CM | POA: Diagnosis not present

## 2023-04-12 DIAGNOSIS — M5023 Other cervical disc displacement, cervicothoracic region: Secondary | ICD-10-CM | POA: Diagnosis not present

## 2023-04-25 ENCOUNTER — Other Ambulatory Visit (HOSPITAL_COMMUNITY): Payer: Self-pay

## 2023-04-25 DIAGNOSIS — E669 Obesity, unspecified: Secondary | ICD-10-CM | POA: Diagnosis not present

## 2023-04-25 DIAGNOSIS — I10 Essential (primary) hypertension: Secondary | ICD-10-CM | POA: Diagnosis not present

## 2023-04-25 DIAGNOSIS — E1159 Type 2 diabetes mellitus with other circulatory complications: Secondary | ICD-10-CM | POA: Diagnosis not present

## 2023-04-25 DIAGNOSIS — Z794 Long term (current) use of insulin: Secondary | ICD-10-CM | POA: Diagnosis not present

## 2023-04-25 DIAGNOSIS — E1169 Type 2 diabetes mellitus with other specified complication: Secondary | ICD-10-CM | POA: Diagnosis not present

## 2023-04-25 MED ORDER — PIOGLITAZONE HCL 30 MG PO TABS
30.0000 mg | ORAL_TABLET | Freq: Every day | ORAL | 3 refills | Status: AC
  Filled 2023-06-12: qty 90, 90d supply, fill #0
  Filled 2023-09-15: qty 90, 90d supply, fill #1
  Filled 2023-12-26: qty 90, 90d supply, fill #0
  Filled 2024-03-30: qty 90, 90d supply, fill #1

## 2023-04-25 MED ORDER — MOUNJARO 12.5 MG/0.5ML ~~LOC~~ SOAJ
12.5000 mg | SUBCUTANEOUS | 3 refills | Status: DC
  Filled 2023-04-25: qty 2, 28d supply, fill #0
  Filled 2023-05-02: qty 6, 84d supply, fill #0
  Filled 2023-06-12: qty 2, 28d supply, fill #0
  Filled 2023-07-12: qty 6, 84d supply, fill #1
  Filled 2023-07-12: qty 2, 28d supply, fill #1

## 2023-04-30 DIAGNOSIS — M1711 Unilateral primary osteoarthritis, right knee: Secondary | ICD-10-CM | POA: Diagnosis not present

## 2023-05-02 ENCOUNTER — Other Ambulatory Visit (HOSPITAL_COMMUNITY): Payer: Self-pay

## 2023-05-03 ENCOUNTER — Other Ambulatory Visit (HOSPITAL_COMMUNITY): Payer: Self-pay

## 2023-05-03 MED ORDER — DEXCOM G7 SENSOR MISC
3 refills | Status: AC
  Filled 2023-05-03: qty 9, 90d supply, fill #0
  Filled 2023-09-02 – 2023-09-18 (×2): qty 9, 90d supply, fill #1
  Filled 2024-01-06: qty 9, 90d supply, fill #0
  Filled 2024-03-18: qty 9, 90d supply, fill #1

## 2023-05-06 ENCOUNTER — Other Ambulatory Visit (HOSPITAL_COMMUNITY): Payer: Self-pay

## 2023-05-07 ENCOUNTER — Other Ambulatory Visit (HOSPITAL_COMMUNITY): Payer: Self-pay

## 2023-05-07 ENCOUNTER — Other Ambulatory Visit: Payer: Self-pay

## 2023-05-07 MED ORDER — ZOLPIDEM TARTRATE 5 MG PO TABS
5.0000 mg | ORAL_TABLET | Freq: Every evening | ORAL | 1 refills | Status: AC | PRN
  Filled 2023-05-07: qty 90, 90d supply, fill #0

## 2023-05-08 ENCOUNTER — Other Ambulatory Visit (HOSPITAL_COMMUNITY): Payer: Self-pay

## 2023-05-09 ENCOUNTER — Other Ambulatory Visit (HOSPITAL_COMMUNITY): Payer: Self-pay

## 2023-05-09 ENCOUNTER — Other Ambulatory Visit: Payer: Self-pay | Admitting: Family Medicine

## 2023-05-09 DIAGNOSIS — M5412 Radiculopathy, cervical region: Secondary | ICD-10-CM | POA: Diagnosis not present

## 2023-05-09 DIAGNOSIS — M4802 Spinal stenosis, cervical region: Secondary | ICD-10-CM | POA: Diagnosis not present

## 2023-05-09 MED ORDER — PREDNISONE 5 MG PO TABS
ORAL_TABLET | ORAL | 0 refills | Status: DC
  Filled 2023-05-09 – 2023-06-12 (×2): qty 21, 6d supply, fill #0

## 2023-05-20 ENCOUNTER — Other Ambulatory Visit (HOSPITAL_COMMUNITY): Payer: Self-pay

## 2023-05-21 ENCOUNTER — Other Ambulatory Visit (HOSPITAL_COMMUNITY): Payer: Self-pay

## 2023-05-23 NOTE — Discharge Instructions (Signed)

## 2023-05-24 ENCOUNTER — Ambulatory Visit
Admission: RE | Admit: 2023-05-24 | Discharge: 2023-05-24 | Disposition: A | Discharge status: Home / Self Care | Payer: 59 | Source: Ambulatory Visit | Attending: Family Medicine | Admitting: Family Medicine

## 2023-05-24 DIAGNOSIS — Z981 Arthrodesis status: Secondary | ICD-10-CM | POA: Diagnosis not present

## 2023-05-24 DIAGNOSIS — M4723 Other spondylosis with radiculopathy, cervicothoracic region: Secondary | ICD-10-CM | POA: Diagnosis not present

## 2023-05-24 DIAGNOSIS — M4803 Spinal stenosis, cervicothoracic region: Secondary | ICD-10-CM | POA: Diagnosis not present

## 2023-05-24 DIAGNOSIS — M5412 Radiculopathy, cervical region: Secondary | ICD-10-CM

## 2023-05-24 MED ORDER — IOPAMIDOL (ISOVUE-M 300) INJECTION 61%
1.0000 mL | Freq: Once | INTRAMUSCULAR | Status: AC | PRN
  Administered 2023-05-24: 1 mL via INTRATHECAL

## 2023-05-24 MED ORDER — TRIAMCINOLONE ACETONIDE 40 MG/ML IJ SUSP (RADIOLOGY)
60.0000 mg | Freq: Once | INTRAMUSCULAR | Status: AC
  Administered 2023-05-24: 60 mg via EPIDURAL

## 2023-06-12 ENCOUNTER — Other Ambulatory Visit (HOSPITAL_COMMUNITY): Payer: Self-pay

## 2023-06-13 ENCOUNTER — Other Ambulatory Visit: Payer: Self-pay

## 2023-06-18 ENCOUNTER — Other Ambulatory Visit (HOSPITAL_COMMUNITY): Payer: Self-pay

## 2023-06-18 MED ORDER — DULOXETINE HCL 60 MG PO CPEP
60.0000 mg | ORAL_CAPSULE | Freq: Every day | ORAL | 3 refills | Status: AC
  Filled 2023-06-18: qty 90, 90d supply, fill #0
  Filled 2023-09-15: qty 90, 90d supply, fill #1
  Filled 2023-12-26: qty 90, 90d supply, fill #0
  Filled 2024-03-30: qty 90, 90d supply, fill #1

## 2023-06-18 MED ORDER — ZOLPIDEM TARTRATE 5 MG PO TABS
5.0000 mg | ORAL_TABLET | Freq: Every evening | ORAL | 1 refills | Status: AC | PRN
  Filled 2023-06-18: qty 90, 90d supply, fill #0
  Filled 2023-09-15: qty 90, 90d supply, fill #1

## 2023-06-18 MED ORDER — OMEPRAZOLE 40 MG PO CPDR
40.0000 mg | DELAYED_RELEASE_CAPSULE | Freq: Every day | ORAL | 3 refills | Status: AC
  Filled 2023-06-18: qty 90, 90d supply, fill #0
  Filled 2023-09-15: qty 90, 90d supply, fill #1
  Filled 2023-12-26: qty 90, 90d supply, fill #0
  Filled 2024-03-30: qty 90, 90d supply, fill #1

## 2023-06-18 MED ORDER — GABAPENTIN 300 MG PO CAPS
600.0000 mg | ORAL_CAPSULE | Freq: Every day | ORAL | 3 refills | Status: AC
  Filled 2023-06-18: qty 180, 90d supply, fill #0
  Filled 2023-09-15: qty 180, 90d supply, fill #1
  Filled 2023-12-26: qty 180, 90d supply, fill #0
  Filled 2024-03-30: qty 180, 90d supply, fill #1

## 2023-06-18 MED ORDER — ATORVASTATIN CALCIUM 40 MG PO TABS
40.0000 mg | ORAL_TABLET | Freq: Every day | ORAL | 3 refills | Status: AC
  Filled 2023-06-18: qty 90, 90d supply, fill #0
  Filled 2023-09-15: qty 90, 90d supply, fill #1
  Filled 2023-12-26: qty 90, 90d supply, fill #0
  Filled 2024-03-30: qty 90, 90d supply, fill #1

## 2023-06-18 MED ORDER — LOSARTAN POTASSIUM 25 MG PO TABS
25.0000 mg | ORAL_TABLET | Freq: Every day | ORAL | 3 refills | Status: AC
  Filled 2023-06-18: qty 90, 90d supply, fill #0
  Filled 2023-09-15: qty 90, 90d supply, fill #1
  Filled 2023-12-26: qty 90, 90d supply, fill #0
  Filled 2024-03-30: qty 90, 90d supply, fill #1

## 2023-06-18 MED ORDER — METOPROLOL SUCCINATE ER 25 MG PO TB24
25.0000 mg | ORAL_TABLET | Freq: Every day | ORAL | 3 refills | Status: AC
  Filled 2023-06-18: qty 90, 90d supply, fill #0
  Filled 2023-09-15: qty 90, 90d supply, fill #1
  Filled 2023-12-26: qty 90, 90d supply, fill #0
  Filled 2024-03-30: qty 90, 90d supply, fill #1

## 2023-06-20 ENCOUNTER — Other Ambulatory Visit (HOSPITAL_COMMUNITY): Payer: Self-pay

## 2023-06-21 ENCOUNTER — Other Ambulatory Visit (HOSPITAL_COMMUNITY): Payer: Self-pay

## 2023-06-21 DIAGNOSIS — E119 Type 2 diabetes mellitus without complications: Secondary | ICD-10-CM | POA: Diagnosis not present

## 2023-06-21 DIAGNOSIS — H524 Presbyopia: Secondary | ICD-10-CM | POA: Diagnosis not present

## 2023-06-21 DIAGNOSIS — H5213 Myopia, bilateral: Secondary | ICD-10-CM | POA: Diagnosis not present

## 2023-06-21 DIAGNOSIS — H2513 Age-related nuclear cataract, bilateral: Secondary | ICD-10-CM | POA: Diagnosis not present

## 2023-06-21 MED ORDER — TOLTERODINE TARTRATE ER 4 MG PO CP24
4.0000 mg | ORAL_CAPSULE | Freq: Every day | ORAL | 3 refills | Status: AC
  Filled 2023-06-21 – 2023-07-12 (×2): qty 90, 90d supply, fill #0
  Filled 2023-10-01 – 2023-10-16 (×2): qty 90, 90d supply, fill #1
  Filled 2023-12-26: qty 90, 90d supply, fill #0
  Filled 2024-03-30: qty 90, 90d supply, fill #1

## 2023-06-24 ENCOUNTER — Other Ambulatory Visit (HOSPITAL_COMMUNITY): Payer: Self-pay

## 2023-07-04 ENCOUNTER — Other Ambulatory Visit (HOSPITAL_COMMUNITY): Payer: Self-pay

## 2023-07-12 ENCOUNTER — Other Ambulatory Visit: Payer: Self-pay

## 2023-07-12 ENCOUNTER — Other Ambulatory Visit (HOSPITAL_COMMUNITY): Payer: Self-pay

## 2023-09-02 ENCOUNTER — Other Ambulatory Visit (HOSPITAL_COMMUNITY): Payer: Self-pay

## 2023-09-02 ENCOUNTER — Other Ambulatory Visit: Payer: Self-pay

## 2023-09-05 ENCOUNTER — Other Ambulatory Visit (HOSPITAL_COMMUNITY): Payer: Self-pay

## 2023-09-05 DIAGNOSIS — E669 Obesity, unspecified: Secondary | ICD-10-CM | POA: Diagnosis not present

## 2023-09-05 DIAGNOSIS — E1159 Type 2 diabetes mellitus with other circulatory complications: Secondary | ICD-10-CM | POA: Diagnosis not present

## 2023-09-05 DIAGNOSIS — Z794 Long term (current) use of insulin: Secondary | ICD-10-CM | POA: Diagnosis not present

## 2023-09-05 DIAGNOSIS — I10 Essential (primary) hypertension: Secondary | ICD-10-CM | POA: Diagnosis not present

## 2023-09-05 DIAGNOSIS — E1169 Type 2 diabetes mellitus with other specified complication: Secondary | ICD-10-CM | POA: Diagnosis not present

## 2023-09-05 MED ORDER — MOUNJARO 15 MG/0.5ML ~~LOC~~ SOAJ
15.0000 mg | SUBCUTANEOUS | 1 refills | Status: DC
  Filled 2023-09-05 – 2023-09-18 (×2): qty 6, 84d supply, fill #0
  Filled 2023-09-20 (×2): qty 2, 28d supply, fill #0
  Filled 2023-10-16: qty 2, 28d supply, fill #1
  Filled 2023-10-16: qty 6, 84d supply, fill #1
  Filled 2023-12-26: qty 4, 56d supply, fill #0

## 2023-09-05 MED ORDER — DEXCOM G7 SENSOR MISC
3 refills | Status: DC
  Filled 2023-09-05 – 2023-09-18 (×2): qty 9, 90d supply, fill #0

## 2023-09-11 DIAGNOSIS — E538 Deficiency of other specified B group vitamins: Secondary | ICD-10-CM | POA: Diagnosis not present

## 2023-09-11 DIAGNOSIS — E1165 Type 2 diabetes mellitus with hyperglycemia: Secondary | ICD-10-CM | POA: Diagnosis not present

## 2023-09-11 DIAGNOSIS — D508 Other iron deficiency anemias: Secondary | ICD-10-CM | POA: Diagnosis not present

## 2023-09-11 DIAGNOSIS — Z794 Long term (current) use of insulin: Secondary | ICD-10-CM | POA: Diagnosis not present

## 2023-09-11 DIAGNOSIS — E7800 Pure hypercholesterolemia, unspecified: Secondary | ICD-10-CM | POA: Diagnosis not present

## 2023-09-16 ENCOUNTER — Other Ambulatory Visit: Payer: Self-pay

## 2023-09-18 ENCOUNTER — Other Ambulatory Visit (HOSPITAL_COMMUNITY): Payer: Self-pay

## 2023-09-18 DIAGNOSIS — Z6841 Body Mass Index (BMI) 40.0 and over, adult: Secondary | ICD-10-CM | POA: Diagnosis not present

## 2023-09-18 DIAGNOSIS — J4520 Mild intermittent asthma, uncomplicated: Secondary | ICD-10-CM | POA: Diagnosis not present

## 2023-09-18 DIAGNOSIS — I10 Essential (primary) hypertension: Secondary | ICD-10-CM | POA: Diagnosis not present

## 2023-09-18 DIAGNOSIS — F324 Major depressive disorder, single episode, in partial remission: Secondary | ICD-10-CM | POA: Diagnosis not present

## 2023-09-18 DIAGNOSIS — I700 Atherosclerosis of aorta: Secondary | ICD-10-CM | POA: Diagnosis not present

## 2023-09-18 DIAGNOSIS — E1165 Type 2 diabetes mellitus with hyperglycemia: Secondary | ICD-10-CM | POA: Diagnosis not present

## 2023-09-18 DIAGNOSIS — Z0000 Encounter for general adult medical examination without abnormal findings: Secondary | ICD-10-CM | POA: Diagnosis not present

## 2023-09-18 DIAGNOSIS — E7800 Pure hypercholesterolemia, unspecified: Secondary | ICD-10-CM | POA: Diagnosis not present

## 2023-09-18 DIAGNOSIS — D472 Monoclonal gammopathy: Secondary | ICD-10-CM | POA: Diagnosis not present

## 2023-09-18 DIAGNOSIS — E538 Deficiency of other specified B group vitamins: Secondary | ICD-10-CM | POA: Diagnosis not present

## 2023-09-20 ENCOUNTER — Other Ambulatory Visit (HOSPITAL_COMMUNITY): Payer: Self-pay

## 2023-09-20 ENCOUNTER — Other Ambulatory Visit: Payer: Self-pay

## 2023-09-25 ENCOUNTER — Other Ambulatory Visit (HOSPITAL_COMMUNITY): Payer: Self-pay

## 2023-09-25 MED ORDER — NABUMETONE 750 MG PO TABS
750.0000 mg | ORAL_TABLET | Freq: Two times a day (BID) | ORAL | 3 refills | Status: AC
  Filled 2023-09-25 – 2023-12-26 (×2): qty 180, 90d supply, fill #0
  Filled 2024-03-30: qty 180, 90d supply, fill #1

## 2023-10-10 ENCOUNTER — Other Ambulatory Visit (HOSPITAL_COMMUNITY): Payer: Self-pay

## 2023-10-16 ENCOUNTER — Other Ambulatory Visit (HOSPITAL_COMMUNITY): Payer: Self-pay

## 2023-10-18 DIAGNOSIS — M1711 Unilateral primary osteoarthritis, right knee: Secondary | ICD-10-CM | POA: Diagnosis not present

## 2023-10-23 ENCOUNTER — Other Ambulatory Visit (HOSPITAL_BASED_OUTPATIENT_CLINIC_OR_DEPARTMENT_OTHER): Payer: Self-pay

## 2023-12-06 ENCOUNTER — Other Ambulatory Visit (HOSPITAL_COMMUNITY): Payer: Self-pay

## 2023-12-06 ENCOUNTER — Other Ambulatory Visit (HOSPITAL_BASED_OUTPATIENT_CLINIC_OR_DEPARTMENT_OTHER): Payer: Self-pay

## 2023-12-06 DIAGNOSIS — M25561 Pain in right knee: Secondary | ICD-10-CM | POA: Diagnosis not present

## 2023-12-06 DIAGNOSIS — M7121 Synovial cyst of popliteal space [Baker], right knee: Secondary | ICD-10-CM | POA: Diagnosis not present

## 2023-12-06 DIAGNOSIS — M1711 Unilateral primary osteoarthritis, right knee: Secondary | ICD-10-CM | POA: Diagnosis not present

## 2023-12-06 MED ORDER — PREDNISONE 10 MG (21) PO TBPK
ORAL_TABLET | ORAL | 0 refills | Status: AC
  Filled 2023-12-06 (×2): qty 21, 6d supply, fill #0

## 2023-12-09 ENCOUNTER — Other Ambulatory Visit (HOSPITAL_BASED_OUTPATIENT_CLINIC_OR_DEPARTMENT_OTHER): Payer: Self-pay

## 2023-12-09 MED ORDER — PREDNISONE 10 MG PO TABS
ORAL_TABLET | ORAL | 0 refills | Status: DC
  Filled 2023-12-09: qty 21, 6d supply, fill #0

## 2023-12-19 ENCOUNTER — Other Ambulatory Visit (HOSPITAL_BASED_OUTPATIENT_CLINIC_OR_DEPARTMENT_OTHER): Payer: Self-pay

## 2023-12-27 ENCOUNTER — Other Ambulatory Visit (HOSPITAL_BASED_OUTPATIENT_CLINIC_OR_DEPARTMENT_OTHER): Payer: Self-pay

## 2024-01-06 ENCOUNTER — Other Ambulatory Visit (HOSPITAL_BASED_OUTPATIENT_CLINIC_OR_DEPARTMENT_OTHER): Payer: Self-pay

## 2024-01-14 ENCOUNTER — Other Ambulatory Visit (HOSPITAL_BASED_OUTPATIENT_CLINIC_OR_DEPARTMENT_OTHER): Payer: Self-pay

## 2024-01-14 DIAGNOSIS — E66812 Obesity, class 2: Secondary | ICD-10-CM | POA: Diagnosis not present

## 2024-01-14 DIAGNOSIS — Z794 Long term (current) use of insulin: Secondary | ICD-10-CM | POA: Diagnosis not present

## 2024-01-14 DIAGNOSIS — E669 Obesity, unspecified: Secondary | ICD-10-CM | POA: Diagnosis not present

## 2024-01-14 DIAGNOSIS — E1169 Type 2 diabetes mellitus with other specified complication: Secondary | ICD-10-CM | POA: Diagnosis not present

## 2024-01-14 DIAGNOSIS — E1159 Type 2 diabetes mellitus with other circulatory complications: Secondary | ICD-10-CM | POA: Diagnosis not present

## 2024-01-14 MED ORDER — PIOGLITAZONE HCL 30 MG PO TABS
15.0000 mg | ORAL_TABLET | Freq: Every day | ORAL | 3 refills | Status: AC
  Filled 2024-01-14: qty 45, 90d supply, fill #0

## 2024-01-14 MED ORDER — MOUNJARO 15 MG/0.5ML ~~LOC~~ SOAJ
15.0000 mg | SUBCUTANEOUS | 1 refills | Status: AC
  Filled 2024-01-14 – 2024-03-18 (×2): qty 6, 84d supply, fill #0
  Filled 2024-06-16: qty 6, 84d supply, fill #1

## 2024-01-14 NOTE — Progress Notes (Signed)
 63 y.o. female seen in follow-up. She was last seen on 09/05/2023.  1. Dabetes mellitus, type 2. Her Hb A1c is 7%. She has a DexCom G6 CGM. Her CGM was downloaded and reviewed. A two week blood glucose avg was 162 mg/dl. She has had 71% of sugars in target range, 26% high, 3% very high, and 0% low. Days with CGM data was 11 / 14 and sensor is active 86% of the time.  Pattern shows best sugars are overnight. She has modest hyperglycemia daily from 12pm - 4pm.   For diabetes, she is taking: pioglitazone  30 mg daily, Mounjaro  15 mg weekly, and Humulin  R U500 10-20 units before meals. Weight is down 13 lbs from last visit. Mounjaro  has been effective at improving glycemic control and with weight loss. She is tolerating Mounjaro  well and denies N/V.  Previous medications for diabetes:   SGLT-2i - declined due to concern about UTI as she has had recurrent UTIs.  Metformin  - declined due to concern about GI upset.  Trulicity  - stopped 11/2022 as not ideally effective  2. Diabetes complications. Her diabetes is complicated by vascular disease.  Last dilated eye exam was in 05/2023.   3. Hypertension. Controlled on losartan  and metoprolol .    Past Medical History:  Diagnosis Date  . Anemia, iron deficiency 04/03/2017  . Aortic atherosclerosis ()    on CT scan 02/2021  . Asthma without status asthmaticus (HHS-HCC)    mild, not requiring treatment  . B12 deficiency 11/16/2016  . Cervical radiculitis 04/28/2015  . Chicken pox   . DDD (degenerative disc disease), cervical 04/28/2015  . DDD (degenerative disc disease), lumbar   . Depression   . Diabetes mellitus type 2 01/2011  . Dyspnea on exertion 01/08/2017   Stress echo negative 8/18; poor exercise tolerance noted on treadmill portion  . Essential (primary) hypertension 03/07/2018  . Isolated proteinuria 11/04/2015   With microscopic hematuria.  Evaluated by nephrology and urology.  Renal stone removed.  Proteinuria markedly improved after this.   . Lumbar spondylosis 11/26/2013  . Lumbar stenosis with neurogenic claudication 11/26/2013  . MGUS (monoclonal gammopathy of unknown significance) 06/01/2015   UPIEP, SPIEP negative 1/17 and 6/17  . Migraines   . Plantar fasciitis   . Pure hypercholesterolemia 06/01/2015  . Renal stone U/S 09/12   right, non-obstructing  . Sleep apnea 2011 SLEEP STUDY   CPAP INITIATED      Outpatient Medications Marked as Taking for the 01/14/24 encounter (Office Visit) with Solum, Anna Melissa, MD  Medication Sig Dispense Refill  . atorvastatin  (LIPITOR) 40 MG tablet Take 1 tablet (40 mg total) by mouth once daily 91 tablet 3  . CONCENTRATED insulin  REGULAR (HUMULIN  R U-500 CONCENTRATED) 500 unit/mL (3 mL) pen injector INJECT 40 UNITS UNDER THE SKIN BEFORE BREAKFAST AND 35 UNITS AT LUNCH AND 20 UNITS BEFORE SUPPER (Patient taking differently: INJECT 10 UNITS UNDER THE SKIN BEFORE BREAKFAST AND 10-15 UNITS AT LUNCH AND 10- 20 UNITS BEFORE SUPPER) 24 mL 3  . DULoxetine  (CYMBALTA ) 60 MG DR capsule Take 1 capsule (60 mg total) by mouth once daily 91 capsule 3  . gabapentin  (NEURONTIN ) 300 MG capsule Take 2 capsules (600 mg total) by mouth at bedtime 182 capsule 3  . losartan  (COZAAR ) 25 MG tablet Take 1 tablet (25 mg total) by mouth once daily 91 tablet 3  . metoprolol  SUCCinate (TOPROL -XL) 25 MG XL tablet Take 1 tablet (25 mg total) by mouth once daily 91 tablet 3  .  nabumetone  (RELAFEN ) 750 MG tablet Take 1 tablet (750 mg total) by mouth 2 (two) times daily. 182 tablet 3  . omeprazole  (PRILOSEC) 40 MG DR capsule Take 1 capsule (40 mg total) by mouth once daily 91 capsule 3  . pioglitazone  (ACTOS ) 30 MG tablet Take 1 tablet (30 mg total) by mouth once daily 90 tablet 3  . rizatriptan (MAXALT) 10 MG tablet Take 10 mg by mouth once as needed for Migraine May take a second dose after 2 hours if needed.    . tirzepatide  (MOUNJARO ) 15 mg/0.5 mL pen injector Inject 0.5 mLs (15 mg total) subcutaneously once a week  6 mL 1  . tolterodine  (DETROL  LA) 4 MG LA capsule Take 1 capsule (4 mg total) by mouth daily. 90 capsule 3  . zolpidem  (AMBIEN ) 5 MG tablet Take 1 tablet (5 mg total) by mouth at bedtime as needed for Sleep 90 tablet 1    Exam BP 138/80   Pulse 80   Ht 160 cm (5' 3)   Wt 93.9 kg (207 lb)   SpO2 96%   BMI 36.67 kg/m   GEN: well developed female in NAD. SKIN: A bilateral bare foot exam shows no ulcerations or calluses.  EXT: No peripheral edema PSYC: alert and oriented, good insight.  Labs Lab Results  Component Value Date   HGBA1C 7.0 (H) 01/14/2024   HGBA1C 7.2 (H) 09/11/2023     Assessment 1. Type 2 diabetes mellitus with obesity  (CMS/HHS-HCC)   2. Type 2 diabetes mellitus with vascular disease (CMS/HHS-HCC)   3. Long-term insulin  use (CMS/HHS-HCC)   4. Obesity, Class II, BMI 35-39.9     Plan  - Her diabetes is well controlled. Congratulated her on the improvement in glycemic control and weight loss. - Continue use of the DexCom CGM for blood glucose monitoring.   - Continue Mounjaro  15 mg once weekly.  - Reduce pioglitazone  to 15 mg daily.   - Continue ARB and statin.   - Continue with annual dilated eye exams.  - Counseled on benefits of exercise, especially strength training. Advised her to start with low weights 10 min per day and build up to higher weights at 20 min 3x per week. Discussed risk of sarcopenia with weight loss.  - Follow up in 4 months or sooner, if needed.

## 2024-01-20 ENCOUNTER — Emergency Department

## 2024-01-20 ENCOUNTER — Other Ambulatory Visit: Payer: Self-pay

## 2024-01-20 ENCOUNTER — Emergency Department
Admission: EM | Admit: 2024-01-20 | Discharge: 2024-01-20 | Disposition: A | Discharge status: Home / Self Care | Source: Ambulatory Visit

## 2024-01-20 DIAGNOSIS — H538 Other visual disturbances: Secondary | ICD-10-CM | POA: Diagnosis not present

## 2024-01-20 DIAGNOSIS — I10 Essential (primary) hypertension: Secondary | ICD-10-CM | POA: Diagnosis not present

## 2024-01-20 DIAGNOSIS — E119 Type 2 diabetes mellitus without complications: Secondary | ICD-10-CM | POA: Insufficient documentation

## 2024-01-20 DIAGNOSIS — R42 Dizziness and giddiness: Secondary | ICD-10-CM | POA: Insufficient documentation

## 2024-01-20 DIAGNOSIS — I6523 Occlusion and stenosis of bilateral carotid arteries: Secondary | ICD-10-CM | POA: Diagnosis not present

## 2024-01-20 LAB — DIFFERENTIAL
Abs Immature Granulocytes: 0.02 K/uL (ref 0.00–0.07)
Basophils Absolute: 0.1 K/uL (ref 0.0–0.1)
Basophils Relative: 1 %
Eosinophils Absolute: 0.1 K/uL (ref 0.0–0.5)
Eosinophils Relative: 2 %
Immature Granulocytes: 0 %
Lymphocytes Relative: 18 %
Lymphs Abs: 1.2 K/uL (ref 0.7–4.0)
Monocytes Absolute: 0.5 K/uL (ref 0.1–1.0)
Monocytes Relative: 7 %
Neutro Abs: 4.9 K/uL (ref 1.7–7.7)
Neutrophils Relative %: 72 %

## 2024-01-20 LAB — COMPREHENSIVE METABOLIC PANEL WITH GFR
ALT: 12 U/L (ref 0–44)
AST: 17 U/L (ref 15–41)
Albumin: 3.5 g/dL (ref 3.5–5.0)
Alkaline Phosphatase: 73 U/L (ref 38–126)
Anion gap: 8 (ref 5–15)
BUN: 16 mg/dL (ref 8–23)
CO2: 26 mmol/L (ref 22–32)
Calcium: 9.1 mg/dL (ref 8.9–10.3)
Chloride: 106 mmol/L (ref 98–111)
Creatinine, Ser: 0.75 mg/dL (ref 0.44–1.00)
GFR, Estimated: >60 mL/min (ref >60)
Glucose, Bld: 207 mg/dL — ABNORMAL HIGH (ref 70–99)
Potassium: 3.6 mmol/L (ref 3.5–5.1)
Sodium: 140 mmol/L (ref 135–145)
Total Bilirubin: 0.6 mg/dL (ref 0.0–1.2)
Total Protein: 6.6 g/dL (ref 6.5–8.1)

## 2024-01-20 LAB — CBG MONITORING, ED: Glucose-Capillary: 177 mg/dL — ABNORMAL HIGH (ref 70–99)

## 2024-01-20 LAB — ETHANOL: Alcohol, Ethyl (B): <15 mg/dL (ref <15)

## 2024-01-20 LAB — CBC
HCT: 36.6 % (ref 36.0–46.0)
Hemoglobin: 12 g/dL (ref 12.0–15.0)
MCH: 32.3 pg (ref 26.0–34.0)
MCHC: 32.8 g/dL (ref 30.0–36.0)
MCV: 98.4 fL (ref 80.0–100.0)
Platelets: 244 K/uL (ref 150–400)
RBC: 3.72 MIL/uL — ABNORMAL LOW (ref 3.87–5.11)
RDW: 13.5 % (ref 11.5–15.5)
WBC: 6.8 K/uL (ref 4.0–10.5)
nRBC: 0 % (ref 0.0–0.2)

## 2024-01-20 LAB — PROTIME-INR
INR: 1 (ref 0.8–1.2)
Prothrombin Time: 14.1 s (ref 11.4–15.2)

## 2024-01-20 LAB — APTT: aPTT: 29 s (ref 24–36)

## 2024-01-20 MED ORDER — IOHEXOL 350 MG/ML SOLN
75.0000 mL | Freq: Once | INTRAVENOUS | Status: AC | PRN
  Administered 2024-01-20: 75 mL via INTRAVENOUS

## 2024-01-20 MED ORDER — DIAZEPAM 5 MG/ML IJ SOLN
5.0000 mg | Freq: Once | INTRAMUSCULAR | Status: AC
  Administered 2024-01-20: 5 mg via INTRAVENOUS
  Filled 2024-01-20: qty 2

## 2024-01-20 MED ORDER — SODIUM CHLORIDE 0.9% FLUSH
3.0000 mL | Freq: Once | INTRAVENOUS | Status: AC
  Administered 2024-01-20: 3 mL via INTRAVENOUS

## 2024-01-20 MED ORDER — DIAZEPAM 5 MG PO TABS: 5.0000 mg | ORAL_TABLET | Freq: Once | ORAL | Status: DC

## 2024-01-20 MED ORDER — MECLIZINE HCL 25 MG PO TABS
25.0000 mg | ORAL_TABLET | Freq: Three times a day (TID) | ORAL | 0 refills | Status: AC | PRN
Start: 2024-01-20 — End: ?

## 2024-01-20 MED ORDER — MECLIZINE HCL 25 MG PO TABS
25.0000 mg | ORAL_TABLET | Freq: Once | ORAL | Status: AC
  Administered 2024-01-20: 25 mg via ORAL
  Filled 2024-01-20: qty 1

## 2024-01-20 MED ORDER — DIAZEPAM 5 MG PO TABS: 5.0000 mg | ORAL_TABLET | Freq: Four times a day (QID) | ORAL | 0 refills | Status: AC | PRN

## 2024-01-20 NOTE — ED Triage Notes (Addendum)
 Pt to Ed via [facility] from [facility]. Pt reports went to bed at 10:30pm last night and woke up this morning at 0630 with right eye blurry vision and worsening dizziness. Pt reports dizziness x2 days. No blood thinner. Reports hx of vertigo. No HA, numbness or tingling. Pt reports harder to ambulate this am.   Pt reports did sleep in contacts last pm

## 2024-01-20 NOTE — Progress Notes (Signed)
 See nursing notes.  Refer patient to the ER for acute onset of dizziness, unsteady gait, and blurred vision that started this morning.

## 2024-01-20 NOTE — ED Triage Notes (Signed)
 Brought from [facility]. C/o dizziness X2 days ago. Woke up with blurry vision today in right eye. Today not being able to ambulate without assistant.   History vertigo

## 2024-01-20 NOTE — Discharge Instructions (Addendum)
 You were seen in the emergency department for several days of intermittent vertigo.  Workup today was reassuring and you are safe to return home.  Do not drive or operate any machinery for the next 24 hours.  You should not do such if you are symptomatic.  Please follow-up with a primary care physician.  Please call make an appointment with neurology as well.  Incidentally he was seen that he had a very small meningioma but given that it is increasing in size I do think you should have this evaluated by neurosurgery and please call and make an appointment.  Return with any acutely worsening symptoms or any other emergency. -- RETURN PRECAUTIONS & AFTERCARE: (ENGLISH) RETURN PRECAUTIONS: Return immediately to the emergency department or see/call your doctor if you feel worse, weak or have changes in speech or vision, are short of breath, have fever, vomiting, pain, bleeding or dark stool, trouble urinating or any new issues. Return here or see/call your doctor if not improving as expected for your suspected condition. FOLLOW-UP CARE: Call your doctor and/or any doctors we referred you to for more advice and to make an appointment. Do this today, tomorrow or after the weekend. Some doctors only take PPO insurance so if you have HMO insurance you may want to contact your HMO or your regular doctor for referral to a specialist within your plan. Either way tell the doctor's office that it was a referral from the emergency department so you get the soonest possible appointment.  YOUR TEST RESULTS: Take result reports of any blood or urine tests, imaging tests and EKG's to your doctor and any referral doctor. Have any abnormal tests repeated. Your doctor or a referral doctor can let you know when this should be done. Also make sure your doctor contacts this hospital to get any test results that are not currently available such as cultures or special tests for infection and final imaging reports, which are often not  available at the time you leave the ER but which may list additional important findings that are not documented on the preliminary report. BLOOD PRESSURE: If your blood pressure was greater than 120/80 have your blood pressure rechecked within 1 to 2 weeks. MEDICATION SIDE EFFECTS: Do not drive, walk, bike, take the bus, etc. if you have received or are being prescribed any sedating medications such as those for pain or anxiety or certain antihistamines like Benadryl . If you have been give one of these here get a taxi home or have a friend drive you home. Ask your pharmacist to counsel you on potential side effects of any new medication

## 2024-01-20 NOTE — ED Notes (Signed)
 Patient transported to MRI

## 2024-01-20 NOTE — ED Provider Notes (Signed)
 [facility] Provider Note    Event Date/Time   First MD Initiated Contact with Patient 01/20/24 [phone]     (approximate)   History   Dizziness   HPI  [name] is a 63 y.o. female type 2 diabetes, hypertension, B12 deficiency, OSA who presents to the emergency department with 3 days of intermittent vertigo and 1 day of right eye blurry vision.  Patient denies any headache.  She does have 1 prior episode of vertigo but was never diagnosed with anything.  Denies any visual field cuts or eye pain.  She states that she was last known normal at 6 PM yesterday evening.  Reports that she has difficulty walking in a straight line but has not fallen.  Denies any chest pain shortness of breath abdominal pain changes in urinary or bowel habits.  She uses hearing aids at baseline.  Husband is at bedside who contributes to history      Physical Exam   Triage Vital Signs: ED Triage Vitals [01/20/24 0938]  Encounter Vitals Group     BP 116/76     Girls Systolic BP Percentile      Girls Diastolic BP Percentile      Boys Systolic BP Percentile      Boys Diastolic BP Percentile      Pulse Rate 73     Resp 18     Temp 98 F (36.7 C)     Temp Source Oral     SpO2 96 %     Weight      Height      Head Circumference      Peak Flow      Pain Score 0     Pain Loc      Pain Education      Exclude from Growth Chart     Most recent vital signs: Vitals:   01/20/24 1200 01/20/24 1317  BP: 114/75 125/70  Pulse: 72 71  Resp: 19 19  Temp:    SpO2: 98% 100%    Nursing Triage Note reviewed. Vital signs reviewed and patients oxygen saturation is normoxic  General: Patient is well nourished, well developed, awake and alert, resting comfortably in no acute distress Head: Normocephalic and atraumatic Eyes: Normal inspection, extraocular muscles intact, no conjunctival pallor Ear, nose, throat: Normal external exam Neck: Normal range of motion Respiratory:  Patient is in no respiratory distress, lungs CTAB Cardiovascular: Patient is not tachycardic, RRR without murmur appreciated GI: Abd SNT with no guarding or rebound  Back: Normal inspection of the back with good strength and range of motion throughout all ext Extremities: pulses intact with good cap refills, no LE pitting edema or calf tenderness Neuro: The patient is alert and oriented to person, place, and time, appropriately conversive, with 5/5 bilat UE/LE strength, no gross motor or sensory defects noted.  On the right side patient does have dysmetria to finger-to-nose and rapid hand.  Slight on the left but less severe.  She does have mildly ataxic gait Skin: Warm, dry, and intact Psych: normal mood and affect, no SI or HI  ED Results / Procedures / Treatments   Labs (all labs ordered are listed, but only abnormal results are displayed) Labs Reviewed  CBC - Abnormal; Notable for the following components:      Result Value   RBC 3.72 (*)    All other components within normal limits  COMPREHENSIVE METABOLIC PANEL WITH GFR - Abnormal; Notable for the following  components:   Glucose, Bld 207 (*)    All other components within normal limits  CBG MONITORING, ED - Abnormal; Notable for the following components:   Glucose-Capillary 177 (*)    All other components within normal limits  PROTIME-INR  APTT  DIFFERENTIAL  ETHANOL  I-STAT CREATININE, ED     EKG EKG and rhythm strip are interpreted by myself:   EKG: [Normal sinus rhythm] at heart rate of 72 normal QRS duration, QTc 460, nonspecific ST segments and T waves no ectopy EKG not consistent with Acute STEMI Rhythm strip: NSR in lead II   RADIOLOGY CT angio head w/wo contrast: No intracranial hemorrhage on my independent review interpretation and radiologist states similarly with no large vessel occlusion.  There was a meningioma that has increased in size MRI brain: No evidence of acute stroke    PROCEDURES:  Critical  Care performed: No  Procedures   MEDICATIONS ORDERED IN ED: Medications  sodium chloride  flush (NS) 0.9 % injection 3 mL (3 mLs Intravenous Given 01/20/24 0954)  meclizine  (ANTIVERT ) tablet 25 mg (25 mg Oral Given 01/20/24 0952)  diazepam  (VALIUM ) injection 5 mg (5 mg Intravenous Given 01/20/24 0952)  iohexol  (OMNIPAQUE ) 350 MG/ML injection 75 mL (75 mLs Intravenous Contrast Given 01/20/24 1113)     IMPRESSION / MDM / ASSESSMENT AND PLAN / ED COURSE                                Differential diagnosis includes, but is not limited to, subacute CVA, Mnire's disease, vestibular neuritis, malignancy, electrolyte derangement anemia  ED course: Patient arrives with 3 days of intermittent symptoms of last episode occurring yesterday evening.  Given chronicity of symptoms she is certainly out of the window for an acute stroke alert/TNK.  She does not exhibit evidence of large vessel occlusion.  She was given meclizine  and Valium  with complete resolve of her symptoms including her dysmetria.  Her case was briefly discussed with neurology and if imaging is negative she can return home and follow-up with their office.  CT angio head and neck was unremarkable along with MRI brain.  She had no leukocytosis, profound electrolyte derangements or increased alcohol level.  She and husband feel comfortable returning home and I did send her with prescription for meclizine  and 2 emergency doses of Valium .  She was counseled not to drive or operate machinery.  Patient was also counseled on the incidental finding of the enlarging meningioma and counseled to follow-up with neurosurgery for further monitoring and she voiced understanding and willing to do such   Clinical Course as of 01/20/24 1525  Mon Jan 20, 2024  9047 Patient cannot now down an exact timeline of her symptoms states that she has had 3 days of intermittent vertigo with last known normal yesterday at 6 PM and this morning right eye blurry vision; Given  how unclear the timeline is--will get neurology involved.  [HD]  9042 I talked with Dr. Michaela neurology and explained the patient's history.  He is in agreement not to make this a stroke alert as she is certainly out of the window for TNK.  Agrees her CT angio head and neck along with MRI brain without contrast and treating symptomatically.  If this is negative she can return home [HD]  1202 CT ANGIO HEAD NECK W WO CM . No acute intracranial abnormality. 2. No large vessel occlusion, hemodynamically significant stenosis, or aneurysm in  the head or neck. 3. Dural-based calcification along the undersurface of the right side of the tentorium, likely representing a small meningioma, increased slightly since the prior exam. No mass effect.   [HD]  1301 MR BRAIN WO CONTRAST No acute abnormality [HD]  1305 I reviewed results with the patient and husband and printed out the reports including the incidentally found increasing meningioma.  She feels improved and I will send her with prescription for meclizine  and emergency Valium 's.  All questions answered and patient voiced understanding and requested discharge [HD]    Clinical Course User Index [HD] Nicholaus Rolland BRAVO, MD   Risk: 5 This patient has a high risk of morbidity due to further diagnostic testing or treatment. Rationale: This patient's evaluation and management involve a high risk of morbidity due to the potential severity of presenting symptoms, need for diagnostic testing, and/or initiation of treatment that may require close monitoring. The differential includes conditions with potential for significant deterioration or requiring escalation of care. Treatment decisions in the ED, including medication administration, procedural interventions, or disposition planning, reflect this level of risk. Additional Support: -- Drug therapy requiring intensive monitoring for toxicity [ ]  -- Decision regarding elective major surgery with  idenitified patient or procedure risk factors [ ]  -- Decision regarding hospitalization or escalation of hospital-level care [ ]  -- Decision not to resuscitate or to de-escalate care because of poor prognosis [ ]  -- Parental controlled substances [ ]   COPA: 5 The patient has a severe exacerbation, progression, or side effect of treatment of the following illness/illnesses: []  OR  The patient has the following acute or chronic illness/injury that poses a possible threat to life or bodily function: [X] : The patient has a potentially serious acute condition or an acute exacerbation of a chronic illness requiring urgent evaluation and management in the Emergency Department. The clinical presentation necessitates immediate consideration of life-threatening or function-threatening diagnoses, even if they are ultimately ruled out.  Data(2/3 categories following were performed): 5 I reviewed or ordered at least three unique tests, external notes, and/or the history required an independent historian as one of the three requirements as following: CBC, CMP, alcohol level, husband at bedside AND  I independently interpreted the following test: CT angio head and neck without contrast OR  I discussed the management of the patient with the following external physician or qualified healthcare provider: Neurology  Suggested E/M Coding Level: 5, 99285, This has been selected based on the 15-Feb-2022 CPT guidelines for E/M codes in the Emergency Department based on 2/3 of the CoPA, Data, and Risk.   FINAL CLINICAL IMPRESSION(S) / ED DIAGNOSES   Final diagnoses:  Vertigo     Rx / DC Orders   ED Discharge Orders          Ordered    meclizine  (ANTIVERT ) 25 MG tablet  3 times daily PRN        01/20/24 1310    diazepam  (VALIUM ) 5 MG tablet  Every 6 hours PRN        01/20/24 1310             Note:  This document was prepared using Dragon voice recognition software and may include unintentional  dictation errors.   Nicholaus Rolland BRAVO, MD 01/20/24 [phone]

## 2024-01-23 DIAGNOSIS — R42 Dizziness and giddiness: Secondary | ICD-10-CM | POA: Diagnosis not present

## 2024-01-28 DIAGNOSIS — H8112 Benign paroxysmal vertigo, left ear: Secondary | ICD-10-CM | POA: Diagnosis not present

## 2024-01-28 DIAGNOSIS — H903 Sensorineural hearing loss, bilateral: Secondary | ICD-10-CM | POA: Diagnosis not present

## 2024-02-04 DIAGNOSIS — H8112 Benign paroxysmal vertigo, left ear: Secondary | ICD-10-CM | POA: Diagnosis not present

## 2024-02-11 DIAGNOSIS — R42 Dizziness and giddiness: Secondary | ICD-10-CM | POA: Diagnosis not present

## 2024-03-18 ENCOUNTER — Other Ambulatory Visit (HOSPITAL_BASED_OUTPATIENT_CLINIC_OR_DEPARTMENT_OTHER): Payer: Self-pay

## 2024-03-19 ENCOUNTER — Other Ambulatory Visit (HOSPITAL_BASED_OUTPATIENT_CLINIC_OR_DEPARTMENT_OTHER): Payer: Self-pay

## 2024-03-30 ENCOUNTER — Other Ambulatory Visit (HOSPITAL_BASED_OUTPATIENT_CLINIC_OR_DEPARTMENT_OTHER): Payer: Self-pay

## 2024-03-31 ENCOUNTER — Other Ambulatory Visit (HOSPITAL_BASED_OUTPATIENT_CLINIC_OR_DEPARTMENT_OTHER): Payer: Self-pay

## 2024-03-31 DIAGNOSIS — E1165 Type 2 diabetes mellitus with hyperglycemia: Secondary | ICD-10-CM | POA: Diagnosis not present

## 2024-03-31 DIAGNOSIS — Z794 Long term (current) use of insulin: Secondary | ICD-10-CM | POA: Diagnosis not present

## 2024-03-31 DIAGNOSIS — I10 Essential (primary) hypertension: Secondary | ICD-10-CM | POA: Diagnosis not present

## 2024-03-31 DIAGNOSIS — E7800 Pure hypercholesterolemia, unspecified: Secondary | ICD-10-CM | POA: Diagnosis not present

## 2024-03-31 DIAGNOSIS — E538 Deficiency of other specified B group vitamins: Secondary | ICD-10-CM | POA: Diagnosis not present

## 2024-04-02 DIAGNOSIS — E538 Deficiency of other specified B group vitamins: Secondary | ICD-10-CM | POA: Diagnosis not present

## 2024-04-02 DIAGNOSIS — D508 Other iron deficiency anemias: Secondary | ICD-10-CM | POA: Diagnosis not present

## 2024-04-02 DIAGNOSIS — E7800 Pure hypercholesterolemia, unspecified: Secondary | ICD-10-CM | POA: Diagnosis not present

## 2024-04-02 DIAGNOSIS — J4520 Mild intermittent asthma, uncomplicated: Secondary | ICD-10-CM | POA: Diagnosis not present

## 2024-04-02 DIAGNOSIS — I700 Atherosclerosis of aorta: Secondary | ICD-10-CM | POA: Diagnosis not present

## 2024-04-02 DIAGNOSIS — E118 Type 2 diabetes mellitus with unspecified complications: Secondary | ICD-10-CM | POA: Diagnosis not present

## 2024-04-02 DIAGNOSIS — I10 Essential (primary) hypertension: Secondary | ICD-10-CM | POA: Diagnosis not present

## 2024-04-02 DIAGNOSIS — F324 Major depressive disorder, single episode, in partial remission: Secondary | ICD-10-CM | POA: Diagnosis not present

## 2024-04-02 DIAGNOSIS — D472 Monoclonal gammopathy: Secondary | ICD-10-CM | POA: Diagnosis not present

## 2024-04-20 DIAGNOSIS — M7752 Other enthesopathy of left foot: Secondary | ICD-10-CM | POA: Diagnosis not present

## 2024-04-20 DIAGNOSIS — M79672 Pain in left foot: Secondary | ICD-10-CM | POA: Diagnosis not present

## 2024-04-20 DIAGNOSIS — M7732 Calcaneal spur, left foot: Secondary | ICD-10-CM | POA: Diagnosis not present

## 2024-04-20 DIAGNOSIS — M722 Plantar fascial fibromatosis: Secondary | ICD-10-CM | POA: Diagnosis not present

## 2024-04-22 ENCOUNTER — Other Ambulatory Visit: Payer: Self-pay | Admitting: Internal Medicine

## 2024-04-22 DIAGNOSIS — Z1231 Encounter for screening mammogram for malignant neoplasm of breast: Secondary | ICD-10-CM

## 2024-05-12 ENCOUNTER — Ambulatory Visit
Admission: RE | Admit: 2024-05-12 | Discharge: 2024-05-12 | Disposition: A | Discharge status: Home / Self Care | Source: Ambulatory Visit | Attending: Internal Medicine | Admitting: Internal Medicine

## 2024-05-12 DIAGNOSIS — Z1231 Encounter for screening mammogram for malignant neoplasm of breast: Secondary | ICD-10-CM | POA: Insufficient documentation

## 2024-05-27 ENCOUNTER — Encounter: Payer: Self-pay | Admitting: Licensed Practical Nurse

## 2024-05-27 ENCOUNTER — Ambulatory Visit: Admitting: Licensed Practical Nurse

## 2024-05-27 VITALS — BP 110/66 | HR 70 | Ht 63.0 in | Wt 194.3 lb

## 2024-05-27 DIAGNOSIS — Z01419 Encounter for gynecological examination (general) (routine) without abnormal findings: Secondary | ICD-10-CM | POA: Diagnosis not present

## 2024-05-27 NOTE — Progress Notes (Signed)
 "    Gynecology Annual Exam  PCP: Fernande Ophelia JINNY DOUGLAS, MD  Chief Complaint:  Chief Complaint  Patient presents with   Gynecologic Exam    History of Present Illness:Patient is a 64 y.o. G1P1001 presents for annual exam. The patient has no complaints today.   HTN noted in chart, pt states she does not have high blood pressure but is on BP medication for other reasons: Metopralol for Migraines Losartin to protect kidneys d/t DM    DM: recently started Monjoro tolerating this well   LMP: No LMP recorded. Patient is postmenopausal. Menarche:12 Average Interval: regular, 28 days Duration of flow: 4 days Heavy Menses: no Clots: yes Intermenstrual Bleeding: no Postcoital Bleeding: no Dysmenorrhea: no  The patient is sexually active 1 female partner. She denies dyspareunia (has vaginal dryness uses lube which helps).  The patient does perform self breast exams.  There is no notable family history of breast or ovarian cancer in her family.  The patient wears seatbelts: yes.   The patient has regular exercise: no.  Is active with her grandkids   The patient denies current symptoms of depression.    Works full time for [employer] with her husband, feels safe  PCP saw in [facility] up to date  Wears contacts last exam Jan 2025  Calcium  and Vit D, dairy products no supplements   Review of Systems: ROS see HPI   Past Medical History:  Patient Active Problem List   Diagnosis Date Noted Date Diagnosed   Vaginal atrophy 08/31/2019    Sleep apnea 12/08/2018     Overview:  Sleep study 9/18 with severe sleep apnea, AHI 87, with significant desaturation.  Patient aware of the importance of CPAP.    Depression 12/08/2018    Lumbar stenosis with neurogenic claudication 11/17/2018    Essential (primary) hypertension 03/07/2018    Uncontrolled type 2 diabetes mellitus with hyperglycemia, with long-term current use of insulin  (HCC) 03/07/2018    Type 2 diabetes  mellitus with kidney complication, with long-term current use of insulin  (HCC) 03/07/2018    Anemia, iron deficiency 04/03/2017    Dyspnea on exertion 01/08/2017     Overview:  Stress echo negative 8/18; poor exercise tolerance noted on treadmill portion    B12 deficiency 11/16/2016    Spondylolisthesis of lumbar region 06/18/2016    Cervical stenosis of spinal canal 04/20/2016    Isolated proteinuria 11/04/2015     Overview:  With microscopic hematuria.  Evaluated by nephrology and urology.  Renal stone removed.  Proteinuria markedly improved after this.    Mass of right side of neck 10/18/2015    Renal calculi 08/24/2015    Asthma without status asthmaticus 08/02/2015     Overview:  mild, not requiring treatment    Degeneration of intervertebral disc of lumbar region 08/02/2015    Clinical depression 08/02/2015    Headache, migraine 08/02/2015    Plantar fasciitis 08/02/2015    Apnea, sleep 08/02/2015     Overview:  CPAP INITIATED    Kidney stones 07/18/2015    Microscopic hematuria 07/18/2015    Recurrent UTI 07/18/2015    MGUS (monoclonal gammopathy of unknown significance) 06/01/2015     Overview:  UPIEP, SPIEP negative 1/17    Pure hypercholesterolemia 06/01/2015    Cervical radiculitis 04/28/2015    Degeneration of intervertebral disc of cervical region 04/28/2015    Type 2 diabetes mellitus (HCC) 02/11/2015    Type 2 diabetes mellitus with hyperglycemia (HCC)  02/11/2015    Neuritis or radiculitis due to rupture of lumbar intervertebral disc 11/26/2013    Degenerative arthritis of lumbar spine 11/26/2013    Lumbar canal stenosis 11/26/2013     Past Surgical History:  Past Surgical History:  Procedure Laterality Date   ANTERIOR CERVICAL DECOMP/DISCECTOMY FUSION N/A 04/20/2016   Procedure: CERVICAL THREE-FOUR  ANTERIOR CERVICAL DECOMPRESSION/DISCECTOMY/FUSION WITH PARTIAL REMOVAL OF PLATE AT CERVICAL FOUR;  Surgeon: Catalina Stains, MD;  Location: [facility] OR;  Service:  Neurosurgery;  Laterality: N/A;   CARPAL TUNNEL RELEASE  1996   CERVICAL BIOPSY  W/ LOOP ELECTRODE EXCISION  08/12/2013   CERVICAL FUSION  2009   COLONOSCOPY     COLONOSCOPY N/A 10/19/2022   Procedure: COLONOSCOPY;  Surgeon: Onita Elspeth Sharper, DO;  Location: [facility] ENDOSCOPY;  Service: Gastroenterology;  Laterality: N/A;   COLPOSCOPY  03/26/2013   CYSTOSCOPY WITH STENT PLACEMENT Right 08/24/2015   Procedure: CYSTOSCOPY WITH STENT PLACEMENT;  Surgeon: Belvie LITTIE Clara, MD;  Location: [facility] ORS;  Service: Urology;  Laterality: Right;   NEPHROLITHOTOMY Right 08/24/2015   Procedure: NEPHROLITHOTOMY PERCUTANEOUS WITH SURGEON TO GET ACCESS;  Surgeon: Belvie LITTIE Clara, MD;  Location: [facility] ORS;  Service: Urology;  Laterality: Right;   TUBAL LIGATION      Gynecologic History:  No LMP recorded. Patient is postmenopausal. Last Pap: Results were: normal  no abnormalities 2022 Last mammogram: 12/23/20225 Results were: BI-RAD I  Obstetric History: G1P1001  Family History:  Family History  Problem Relation Age of Onset   Heart failure Mother    Heart attack Father    Colon cancer Maternal Grandfather 68   Kidney disease Neg Hx    Bladder Cancer Neg Hx    Breast cancer Neg Hx     Social History:  Social History   Socioeconomic History   Marital status: Married    Spouse name: Not on file   Number of children: Not on file   Years of education: Not on file   Highest education level: Not on file  Occupational History   Occupation: [employer]    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  Tobacco Use   Smoking status: Never   Smokeless tobacco: Never  Vaping Use   Vaping status: Never Used  Substance and Sexual Activity   Alcohol use: Yes    Alcohol/week: 0.0 standard drinks of alcohol    Comment: occasional   Drug use: No   Sexual activity: Yes    Birth control/protection: Post-menopausal  Other Topics Concern   Not on file  Social History Narrative   Not on file   Social Drivers of Health    Tobacco Use: Low Risk  (05/19/2024)   Received from [facility]   Patient History    Smoking Tobacco Use: Never    Smokeless Tobacco Use: Never    Passive Exposure: Not on file  Financial Resource Strain: Low Risk  (04/20/2024)   Received from [facility]   Overall Financial Resource Strain (CARDIA)    Difficulty of Paying Living Expenses: Not hard at all  Food Insecurity: No Food Insecurity (04/20/2024)   Received from [facility]   Epic    Within the past 12 months, you worried that your food would run out before you got the money to buy more.: Never true    Within the past 12 months, the food you bought just didn't last and you didn't have money to get more.: Never true  Transportation Needs: No Transportation Needs (  04/20/2024)   Received from [facility]   PRAPARE - Transportation    In the past 12 months, has lack of transportation kept you from medical appointments or from getting medications?: No    Lack of Transportation (Non-Medical): No  Physical Activity: Not on file  Stress: Not on file  Social Connections: Not on file  Intimate Partner Violence: Not on file  Depression (EYV7-0): Not on file  Alcohol Screen: Not on file  Housing: Low Risk  (04/20/2024)   Received from [facility]   Epic    In the last 12 months, was there a time when you were not able to pay the mortgage or rent on time?: No    In the past 12 months, how many times have you moved where you were living?: 0    At any time in the past 12 months, were you homeless or living in a shelter (including now)?: No  Utilities: Not At Risk (04/20/2024)   Received from [facility]   Epic    In the past 12 months has the electric, gas, oil, or water  company threatened to shut off services in your home?: No  Health Literacy: Not on file    Allergies:  Allergies[1]  Medications: Prior to Admission  medications  Medication Sig Start Date End Date Taking? Authorizing Provider  acetaminophen  (TYLENOL ) 325 MG tablet Take by mouth. 10/11/18   [provider]  atorvastatin  (LIPITOR) 40 MG tablet Take 1 tablet (40 mg total) by mouth daily. 04/23/22     atorvastatin  (LIPITOR) 40 MG tablet Take 1 tablet (40 mg total) by mouth daily. 06/18/23     Continuous Glucose Receiver (DEXCOM G7 RECEIVER) DEVI Use as directed to monitor blood sugar continuously. 12/17/22     Continuous Glucose Sensor (DEXCOM G7 SENSOR) MISC Change sensor every 10 days 05/03/23     cyclobenzaprine  (FLEXERIL ) 10 MG tablet Take 1 tablet (10 mg total) by mouth 3 (three) times daily as needed for muscle spasms. 11/19/18   Bergman, Meghan D, NP  DULoxetine  (CYMBALTA ) 60 MG capsule Take 1 capsule (60 mg total) by mouth daily. 08/27/22     DULoxetine  (CYMBALTA ) 60 MG capsule Take 1 capsule (60 mg total) by mouth daily. 06/18/23     gabapentin  (NEURONTIN ) 300 MG capsule Take 2 capsules (600 mg total) by mouth at bedtime. 04/23/22     gabapentin  (NEURONTIN ) 300 MG capsule Take 2 capsules (600 mg total) by mouth at bedtime. 06/18/23     insulin  regular human CONCENTRATED (HUMULIN  R U-500 KWIKPEN) 500 UNIT/ML kwikpen Inject 35-45 Units into the skin See admin instructions. 45 units in the morning, 40 units at lunch, and 35 units at dinner  She is adjusting mealtime insulin  to reflect that she is not eating much post-op    [provider]  insulin  regular human CONCENTRATED (HUMULIN  R U-500 KWIKPEN) 500 UNIT/ML KwikPen Inject 40 Units into the skin daily before breakfast AND 35 Units daily with lunch AND 30 Units daily before supper. 09/06/21     insulin  regular human CONCENTRATED (HUMULIN  R U-500 KWIKPEN) 500 UNIT/ML KwikPen INJECT 40 UNITS UNDER THE SKIN BEFORE BREAKFAST AND 35 UNITS AT LUNCH AND 30 UNITS BEFORE SUPPER 09/24/22     insulin  regular human CONCENTRATED (HUMULIN  R U-500 KWIKPEN) 500 UNIT/ML KwikPen Inject 40 Units into the  skin daily before breakfast AND 35 Units daily at lunch AND 20 Units daily before supper. 12/17/22     insulin   regular human CONCENTRATED (HUMULIN  R) 500 UNIT/ML kwikpen INJECT 45 UNITS UNDER THE SKIN BEFORE BREAKFAST AND 40 UNITS AT LUNCH AND 35 UNITS BEFORE SUPPER 05/12/20 05/12/21  Damian Therisa HERO, MD  losartan  (COZAAR ) 25 MG tablet Take 1 tablet (25 mg total) by mouth once daily 04/23/22     losartan  (COZAAR ) 25 MG tablet Take 1 tablet (25 mg total) by mouth daily. 06/18/23     meclizine  (ANTIVERT ) 25 MG tablet Take 1 tablet (25 mg total) by mouth 3 (three) times daily as needed for dizziness. 01/20/24   Nicholaus Rolland BRAVO, MD  metoprolol  succinate (TOPROL -XL) 25 MG 24 hr tablet Take 1 tablet (25 mg total) by mouth once daily 04/23/22     metoprolol  succinate (TOPROL -XL) 25 MG 24 hr tablet Take 1 tablet (25 mg total) by mouth daily. 06/18/23     nabumetone  (RELAFEN ) 750 MG tablet Take 1 tablet (750 mg total) by mouth 2 (two) times daily. 09/25/23     omeprazole  (PRILOSEC) 40 MG capsule Take 1 capsule (40 mg total) by mouth 2 (two) times daily before a meal. 04/23/22     omeprazole  (PRILOSEC) 40 MG capsule Take 1 capsule (40 mg total) by mouth daily. 06/18/23     ondansetron  (ZOFRAN ) 4 MG tablet Take 1 tablet (4 mg total) by mouth 2 (two) times daily as needed. 03/22/22     pioglitazone  (ACTOS ) 30 MG tablet Take 1 tablet (30 mg total) by mouth daily. 12/17/22     pioglitazone  (ACTOS ) 30 MG tablet Take 1 tablet (30 mg total) by mouth daily. 04/25/23     pioglitazone  (ACTOS ) 30 MG tablet Take 0.5 tablets (15 mg total) by mouth daily. 01/14/24     rizatriptan (MAXALT) 10 MG tablet Take 10 mg by mouth as needed for migraine. May repeat in 2 hours if needed    [provider]  tirzepatide  (MOUNJARO ) 15 MG/0.5ML Pen Inject 15 mg into the skin once a week. 01/14/24     tolterodine  (DETROL  LA) 4 MG 24 hr capsule Take 1 capsule (4 mg total) by mouth daily. 06/21/23     UNIFINE PENTIPS 31G X 5 MM MISC  05/26/15    [provider]  zolpidem  (AMBIEN ) 5 MG tablet Take 1 tablet (5 mg total) by mouth at bedtime as needed for sleep 05/07/23     zolpidem  (AMBIEN ) 5 MG tablet Take 1 tablet (5 mg total) by mouth at bedtime as needed for sleep. 06/18/23       Physical Exam Vitals: There were no vitals taken for this visit.  General: NAD HEENT: normocephalic, anicteric Thyroid: no enlargement, no palpable nodules Pulmonary: No increased work of breathing, CTAB Cardiovascular: RRR, distal pulses 2+ Breast: Declined exam  Abdomen: NABS, soft, non-tender, non-distended.  Umbilicus without lesions.  No hepatomegaly, splenomegaly or masses palpable. No evidence of hernia  Genitourinary:  External: Normal external female genitalia for post menopausal state.  Normal urethral meatus, normal Bartholin's and Skene's glands.    Vagina: Normal vaginal mucosa, no evidence of prolapse.    Cervix: Grossly normal in appearance, no bleeding  Uterus: Non-enlarged, mobile, normal contour.  No CMT, some tone   Adnexa: ovaries non-enlarged, no adnexal masses  Rectal: deferred  Lymphatic: no evidence of inguinal lymphadenopathy Extremities: no edema, erythema, or tenderness Neurologic: Grossly intact Psychiatric: mood appropriate, affect full    Assessment: 64 y.o. G1P1001 routine annual exam  Plan: Problem List Items Addressed This Visit   None Visit Diagnoses  Well woman exam    -  Primary       1) Mammogram - recommend yearly screening mammogram.  Mammogram Is up to date  2) STI screening  wasoffered and declined  3) ASCCP guidelines and rational discussed.  Patient opts for every 5 years screening interval due 2027  4) Osteoporosis  - per USPTF routine screening DEXA at age 67   Consider FDA-approved medical therapies in postmenopausal women and men aged 74 years and older, based on the following: a) A hip or vertebral (clinical or morphometric) fracture b) T-score <= -2.5 at the femoral  neck or spine after appropriate evaluation to exclude secondary causes C) Low bone mass (T-score between -1.0 and -2.5 at the femoral neck or spine) and a 10-year probability of a hip fracture >= 3% or a 10-year probability of a major osteoporosis-related fracture >= 20% based on the [place] -adapted WHO algorithm   5) Routine healthcare maintenance including cholesterol, diabetes screening discussed managed by PCP  6) Colonoscopy completed in 2024.  Screening recommended starting at age 72 for average risk individuals, age 52 for individuals deemed at increased risk (including African Americans) and recommended to continue until age 57.  For patient age 70-85 individualized approach is recommended.  Gold standard screening is via colonoscopy, Cologuard screening is an acceptable alternative for patient unwilling or unable to undergo colonoscopy.  Colorectal cancer screening for average?risk adults: 2018 guideline update from the American Cancer SocietyCA: A Cancer Journal for Clinicians: Oct 17, 2016   7) No follow-ups on file.   Jinnie Cookey, CNM  [facility] 05/27/2024, 9:25 AM                [1]  Allergies Allergen Reactions   Clarithromycin Nausea And Vomiting and Nausea Only   "

## 2024-06-16 ENCOUNTER — Other Ambulatory Visit (HOSPITAL_BASED_OUTPATIENT_CLINIC_OR_DEPARTMENT_OTHER): Payer: Self-pay
